# Patient Record
Sex: Female | Born: 1960 | Race: Black or African American | Hispanic: No | Smoking: Current every day smoker
Health system: Southern US, Community
[De-identification: ages and names within clinical notes are randomized; demographics above are authoritative.]

## PROBLEM LIST (undated history)

## (undated) DIAGNOSIS — I10 Essential (primary) hypertension: Secondary | ICD-10-CM

## (undated) DIAGNOSIS — J45909 Unspecified asthma, uncomplicated: Secondary | ICD-10-CM

## (undated) DIAGNOSIS — N920 Excessive and frequent menstruation with regular cycle: Secondary | ICD-10-CM

## (undated) HISTORY — PX: OTHER SURGICAL HISTORY: SHX169

## (undated) HISTORY — DX: Unspecified asthma, uncomplicated: J45.909

## (undated) HISTORY — PX: ABDOMINAL HYSTERECTOMY: SHX81

## (undated) HISTORY — DX: Excessive and frequent menstruation with regular cycle: N92.0

## (undated) HISTORY — PX: FOOT SURGERY: SHX648

## (undated) HISTORY — DX: Essential (primary) hypertension: I10

---

## 2006-02-19 ENCOUNTER — Emergency Department: Payer: Self-pay | Admitting: General Practice

## 2011-04-14 ENCOUNTER — Other Ambulatory Visit: Payer: Self-pay | Admitting: Family Medicine

## 2011-04-14 ENCOUNTER — Ambulatory Visit: Payer: Self-pay | Admitting: Family Medicine

## 2011-05-12 ENCOUNTER — Ambulatory Visit: Payer: Self-pay | Admitting: Gastroenterology

## 2011-05-16 LAB — PATHOLOGY REPORT

## 2012-02-10 ENCOUNTER — Emergency Department: Payer: Self-pay | Admitting: Emergency Medicine

## 2012-02-10 LAB — COMPREHENSIVE METABOLIC PANEL
Albumin: 4 g/dL (ref 3.4–5.0)
Alkaline Phosphatase: 55 U/L (ref 50–136)
Bilirubin,Total: 0.3 mg/dL (ref 0.2–1.0)
Chloride: 102 mmol/L (ref 98–107)
Co2: 29 mmol/L (ref 21–32)
Creatinine: 0.75 mg/dL (ref 0.60–1.30)
EGFR (Non-African Amer.): >60
Osmolality: 275 (ref 275–301)
Potassium: 3.6 mmol/L (ref 3.5–5.1)
SGPT (ALT): 20 U/L
Sodium: 138 mmol/L (ref 136–145)

## 2012-02-10 LAB — CBC
HCT: 45.7 % (ref 35.0–47.0)
HGB: 15 g/dL (ref 12.0–16.0)
MCH: 31.6 pg (ref 26.0–34.0)
MCHC: 32.9 g/dL (ref 32.0–36.0)
RBC: 4.76 10*6/uL (ref 3.80–5.20)
WBC: 4.9 10*3/uL (ref 3.6–11.0)

## 2012-02-10 LAB — CK TOTAL AND CKMB (NOT AT ARMC)
CK, Total: 147 U/L (ref 21–215)
CK-MB: 1.9 ng/mL (ref 0.5–3.6)

## 2012-02-11 ENCOUNTER — Emergency Department: Payer: Self-pay | Admitting: Internal Medicine

## 2012-05-03 ENCOUNTER — Other Ambulatory Visit: Payer: Self-pay | Admitting: Family Medicine

## 2012-05-03 ENCOUNTER — Ambulatory Visit: Payer: Self-pay | Admitting: Family Medicine

## 2012-05-03 LAB — CBC WITH DIFFERENTIAL/PLATELET
Basophil #: 0.1 10*3/uL (ref 0.0–0.1)
HCT: 42.8 % (ref 35.0–47.0)
HGB: 14.5 g/dL (ref 12.0–16.0)
Lymphocyte #: 2.1 10*3/uL (ref 1.0–3.6)
Lymphocyte %: 37.3 %
MCH: 32.1 pg (ref 26.0–34.0)
MCHC: 33.8 g/dL (ref 32.0–36.0)
MCV: 95 fL (ref 80–100)
Monocyte #: 0.3 x10 3/mm (ref 0.2–0.9)
Neutrophil #: 2.8 10*3/uL (ref 1.4–6.5)
Neutrophil %: 49.2 %
RBC: 4.51 10*6/uL (ref 3.80–5.20)
WBC: 5.7 10*3/uL (ref 3.6–11.0)

## 2012-05-03 LAB — COMPREHENSIVE METABOLIC PANEL
Albumin: 3.6 g/dL (ref 3.4–5.0)
Anion Gap: 9 (ref 7–16)
Bilirubin,Total: 0.2 mg/dL (ref 0.2–1.0)
Chloride: 105 mmol/L (ref 98–107)
Co2: 26 mmol/L (ref 21–32)
Creatinine: 0.83 mg/dL (ref 0.60–1.30)
EGFR (Non-African Amer.): >60
Potassium: 3.6 mmol/L (ref 3.5–5.1)
SGOT(AST): 28 U/L (ref 15–37)
Total Protein: 7.2 g/dL (ref 6.4–8.2)

## 2012-05-03 LAB — LIPASE, BLOOD: Lipase: 134 U/L (ref 73–393)

## 2015-08-12 ENCOUNTER — Ambulatory Visit
Admission: RE | Admit: 2015-08-12 | Discharge: 2015-08-12 | Disposition: A | Discharge status: Home / Self Care | Payer: Self-pay | Source: Ambulatory Visit | Attending: Oncology | Admitting: Oncology

## 2015-08-12 ENCOUNTER — Ambulatory Visit: Payer: Self-pay | Attending: Oncology

## 2015-08-12 VITALS — BP 120/84 | HR 96 | Temp 96.0°F | Ht 66.0 in | Wt 188.6 lb

## 2015-08-12 DIAGNOSIS — Z1231 Encounter for screening mammogram for malignant neoplasm of breast: Secondary | ICD-10-CM | POA: Insufficient documentation

## 2015-08-12 DIAGNOSIS — Z0000 Encounter for general adult medical examination without abnormal findings: Secondary | ICD-10-CM

## 2015-08-12 NOTE — Progress Notes (Signed)
Subjective:     Patient ID: Jamie Wilkins, female   DOB: 1961-10-27, 54 y.o.   MRN: [id number]  HPI   Review of Systems     Objective:   Physical Exam  Pulmonary/Chest: Right breast exhibits no inverted nipple, no mass, no nipple discharge, no skin change and no tenderness. Left breast exhibits no inverted nipple, no mass, no nipple discharge, no skin change and no tenderness. Breasts are symmetrical.       Assessment:    54 year old patient presents for [facility] clinic visit.  Patient screened, and meets BCCCP eligibility.  Patient does not have insurance, Medicare or Medicaid.  Handout given on Affordable Care Act. CBE unremarkable.  Instructed patient on breast self-exam using teach back method.     Plan:     Sent for bilateral screening mammogram.

## 2015-08-19 NOTE — Progress Notes (Signed)
Letter mailed from [facility] to notify of normal mammogram results.  Patient to return in one year for annual screening.  Copy to [facility]. 

## 2018-04-09 ENCOUNTER — Other Ambulatory Visit (HOSPITAL_COMMUNITY): Payer: Self-pay | Admitting: Nurse Practitioner

## 2018-04-09 DIAGNOSIS — Z1231 Encounter for screening mammogram for malignant neoplasm of breast: Secondary | ICD-10-CM

## 2018-05-23 ENCOUNTER — Ambulatory Visit (HOSPITAL_COMMUNITY)
Admission: RE | Admit: 2018-05-23 | Discharge: 2018-05-23 | Disposition: A | Discharge status: Home / Self Care | Payer: BLUE CROSS/BLUE SHIELD | Source: Ambulatory Visit | Attending: Nurse Practitioner | Admitting: Nurse Practitioner

## 2018-05-23 DIAGNOSIS — Z1231 Encounter for screening mammogram for malignant neoplasm of breast: Secondary | ICD-10-CM | POA: Diagnosis present

## 2020-10-06 ENCOUNTER — Ambulatory Visit (INDEPENDENT_AMBULATORY_CARE_PROVIDER_SITE_OTHER): Payer: No Typology Code available for payment source

## 2020-10-06 ENCOUNTER — Ambulatory Visit
Admission: EM | Admit: 2020-10-06 | Discharge: 2020-10-06 | Disposition: A | Discharge status: Home / Self Care | Payer: No Typology Code available for payment source

## 2020-10-06 DIAGNOSIS — S92355A Nondisplaced fracture of fifth metatarsal bone, left foot, initial encounter for closed fracture: Secondary | ICD-10-CM | POA: Diagnosis not present

## 2020-10-06 MED ORDER — IBUPROFEN 800 MG PO TABS: 800.0000 mg | ORAL_TABLET | Freq: Three times a day (TID) | ORAL | 0 refills | Status: DC | PRN

## 2020-10-06 NOTE — Discharge Instructions (Addendum)
Take the ibuprofen as prescribed.  Rest and elevate your foot.  Apply ice packs 2-3 times a day for up to 20 minutes each.  Wear the walking boot and use the crutches.  Follow-up with an orthopedist as directed.

## 2020-10-06 NOTE — ED Triage Notes (Signed)
Pt reports having L foot swelling and pain x5 days. No known injury to foot. Painful to put pressure on foot.

## 2020-10-06 NOTE — ED Provider Notes (Signed)
Renaldo Fiddler    CSN: [id number] Arrival date & time: 10/06/20  3664      History   Chief Complaint Chief Complaint  Patient presents with  . Foot Swelling    HPI KARLEE STAFF is a 59 y.o. female.   Patient presents with 5-day history of left foot pain, redness, warmth, swelling.  No known injury.  She denies fever, chills, wounds, numbness, weakness, tingling, or other symptoms.  OTC treatment attempted at home.  Patient denies pertinent medical history.  The history is provided by the patient.    History reviewed. No pertinent past medical history.  There are no problems to display for this patient.   Past Surgical History:  Procedure Laterality Date  . ABDOMINAL HYSTERECTOMY     partial  . FOOT SURGERY      OB History   No obstetric history on file.      Home Medications    Prior to Admission medications   Medication Sig Start Date End Date Taking? Authorizing Provider  buPROPion (WELLBUTRIN SR) 150 MG 12 hr tablet Take 150 mg by mouth 2 (two) times daily.   Yes [provider]  ibuprofen (ADVIL) 800 MG tablet Take 1 tablet (800 mg total) by mouth every 8 (eight) hours as needed. 10/06/20   Mickie Bail, NP    Family History No family history on file.  Social History Social History   Tobacco Use  . Smoking status: Current Every Day Smoker  . Smokeless tobacco: Never Used  Substance Use Topics  . Alcohol use: Yes  . Drug use: Never     Allergies   Patient has no known allergies.   Review of Systems Review of Systems  Constitutional: Negative for chills and fever.  HENT: Negative for ear pain and sore throat.   Eyes: Negative for pain and visual disturbance.  Respiratory: Negative for cough and shortness of breath.   Cardiovascular: Negative for chest pain and palpitations.  Gastrointestinal: Negative for abdominal pain and vomiting.  Genitourinary: Negative for dysuria and hematuria.  Musculoskeletal: Positive for  arthralgias. Negative for back pain.  Skin: Positive for color change. Negative for wound.  Neurological: Negative for seizures, syncope, weakness and numbness.  All other systems reviewed and are negative.    Physical Exam Triage Vital Signs ED Triage Vitals  Enc Vitals Group     BP 10/06/20 0846 (!) 153/83     Pulse Rate 10/06/20 0846 86     Resp 10/06/20 0846 16     Temp 10/06/20 0846 98.8 F (37.1 C)     Temp src --      SpO2 10/06/20 0846 98 %     Weight 10/06/20 0847 150 lb (68 kg)     Height 10/06/20 0847 5\' 7"  (1.702 m)     Head Circumference --      Peak Flow --      Pain Score 10/06/20 0846 10     Pain Loc --      Pain Edu? --      Excl. in GC? --    No data found.  Updated Vital Signs BP (!) 153/83   Pulse 86   Temp 98.8 F (37.1 C)   Resp 16   Ht 5\' 7"  (1.702 m)   Wt 150 lb (68 kg)   LMP 05/11/2010 (Approximate)   SpO2 98%   BMI 23.49 kg/m   Visual Acuity Right Eye Distance:   Left Eye Distance:  Bilateral Distance:    Right Eye Near:   Left Eye Near:    Bilateral Near:     Physical Exam Vitals and nursing note reviewed.  Constitutional:      General: She is not in acute distress.    Appearance: She is well-developed.  HENT:     Head: Normocephalic and atraumatic.     Mouth/Throat:     Mouth: Mucous membranes are moist.  Eyes:     Conjunctiva/sclera: Conjunctivae normal.  Cardiovascular:     Rate and Rhythm: Normal rate and regular rhythm.     Heart sounds: Normal heart sounds.  Pulmonary:     Effort: Pulmonary effort is normal. No respiratory distress.     Breath sounds: Normal breath sounds.  Abdominal:     Palpations: Abdomen is soft.     Tenderness: There is no abdominal tenderness.  Musculoskeletal:        General: Swelling and tenderness present. No deformity. Normal range of motion.     Cervical back: Neck supple.       Feet:  Skin:    General: Skin is warm and dry.     Capillary Refill: Capillary refill takes less than  2 seconds.     Findings: Erythema present.  Neurological:     Mental Status: She is alert.     Sensory: No sensory deficit.     Motor: No weakness.     Gait: Gait abnormal.  Psychiatric:        Mood and Affect: Mood normal.        Behavior: Behavior normal.      UC Treatments / Results  Labs (all labs ordered are listed, but only abnormal results are displayed) Labs Reviewed - No data to display  EKG   Radiology DG Foot Complete Left  Result Date: 10/06/2020 CLINICAL DATA:  59 year old female with a history of foot swelling and pain EXAM: LEFT FOOT - COMPLETE 3+ VIEW COMPARISON:  None. FINDINGS: Surgical changes of distal tibia and fibula, incompletely imaged. Osteopenia. Oblique fracture of the fifth metatarsal without significant displacement. Osteopenia of the forefoot. Degenerative changes of the interphalangeal joints. Soft tissue swelling on the dorsum of the forefoot IMPRESSION: Oblique fracture of the fifth metatarsal without significant displacement. Osteopenia. Surgical changes of the tibia and fibula, incompletely imaged. Electronically Signed   By: Gilmer Mor D.O.   On: 10/06/2020 09:14    Procedures Procedures (including critical care time)  Medications Ordered in UC Medications - No data to display  Initial Impression / Assessment and Plan / UC Course  I have reviewed the triage vital signs and the nursing notes.  Pertinent labs & imaging results that were available during my care of the patient were reviewed by me and considered in my medical decision making (see chart for details).   Fracture of the fifth metatarsal, left.  X-ray shows "Oblique fracture of the fifth metatarsal without significant displacement."  Treating with cam boot, crutches, ibuprofen, rest, elevation, ice packs.  Instructed patient to follow-up with orthopedics such as [facility].  Patient agrees to plan of care.     Final Clinical Impressions(s) / UC Diagnoses   Final  diagnoses:  Closed nondisplaced fracture of fifth metatarsal bone of left foot, initial encounter     Discharge Instructions     Take the ibuprofen as prescribed.  Rest and elevate your foot.  Apply ice packs 2-3 times a day for up to 20 minutes each.  Wear the walking boot and  use the crutches.  Follow-up with an orthopedist as directed.         ED Prescriptions    Medication Sig Dispense Auth. Provider   ibuprofen (ADVIL) 800 MG tablet Take 1 tablet (800 mg total) by mouth every 8 (eight) hours as needed. 21 tablet Mickie Bail, NP     PDMP not reviewed this encounter.   Mickie Bail, NP 10/06/20 [phone]

## 2020-10-22 ENCOUNTER — Other Ambulatory Visit: Payer: Self-pay | Admitting: Family Medicine

## 2020-10-22 DIAGNOSIS — M8580 Other specified disorders of bone density and structure, unspecified site: Secondary | ICD-10-CM

## 2020-10-22 DIAGNOSIS — Z1231 Encounter for screening mammogram for malignant neoplasm of breast: Secondary | ICD-10-CM

## 2020-11-23 NOTE — Progress Notes (Deleted)
   New Outpatient Visit Date: 11/25/2020  Referring Provider: Lorn Junes, FNP [address] [place],  [place] [address]  Chief Complaint: ***  HPI:  Jamie Wilkins is a 60 y.o. female who is being seen today for the evaluation of palpitations at the request of Ms. Crawford. She has a history of ***. ***  --------------------------------------------------------------------------------------------------  Cardiovascular History & Procedures: Cardiovascular Problems:  ***  Risk Factors:  ***  Cath/PCI:  ***  CV Surgery:  ***  EP Procedures and Devices:  ***  Non-Invasive Evaluation(s):  ***  Recent CV Pertinent Labs: Lab Results  Component Value Date   K 3.6 05/03/2012   BUN 14 05/03/2012   CREATININE 0.83 05/03/2012    --------------------------------------------------------------------------------------------------  No past medical history on file.  Past Surgical History:  Procedure Laterality Date  . ABDOMINAL HYSTERECTOMY     partial  . FOOT SURGERY      No outpatient medications have been marked as taking for the 11/25/20 encounter (Appointment) with Notnamed Croucher, Cristal Deer, MD.    Allergies: Patient has no known allergies.  Social History   Tobacco Use  . Smoking status: Current Every Day Smoker  . Smokeless tobacco: Never Used  Substance Use Topics  . Alcohol use: Yes  . Drug use: Never    No family history on file.  Review of Systems: A 12-system review of systems was performed and was negative except as noted in the HPI.  --------------------------------------------------------------------------------------------------  Physical Exam: LMP 05/11/2010 (Approximate)   General:  *** HEENT: No conjunctival pallor or scleral icterus. Facemask in place. Neck: Supple without lymphadenopathy, thyromegaly, JVD, or HJR. No carotid bruit. Lungs: Normal work of breathing. Clear to auscultation bilaterally without wheezes or crackles. Heart:  Regular rate and rhythm without murmurs, rubs, or gallops. Non-displaced PMI. Abd: Bowel sounds present. Soft, NT/ND without hepatosplenomegaly Ext: No lower extremity edema. Radial, PT, and DP pulses are 2+ bilaterally Skin: Warm and dry without rash. Neuro: CNIII-XII intact. Strength and fine-touch sensation intact in upper and lower extremities bilaterally. Psych: Normal mood and affect.  EKG:  ***  Lab Results  Component Value Date   WBC 5.7 05/03/2012   HGB 14.5 05/03/2012   HCT 42.8 05/03/2012   MCV 95 05/03/2012   PLT 221 05/03/2012    Lab Results  Component Value Date   NA 140 05/03/2012   K 3.6 05/03/2012   CL 105 05/03/2012   CO2 26 05/03/2012   BUN 14 05/03/2012   CREATININE 0.83 05/03/2012   GLUCOSE 104 (H) 05/03/2012   ALT 24 05/03/2012    No results found for: CHOL, HDL, LDLCALC, LDLDIRECT, TRIG, CHOLHDL   --------------------------------------------------------------------------------------------------  ASSESSMENT AND PLAN: ***  Yvonne Kendall, MD 11/23/2020 1:43 PM

## 2020-11-25 ENCOUNTER — Encounter: Payer: Self-pay | Admitting: *Deleted

## 2020-11-25 ENCOUNTER — Other Ambulatory Visit: Payer: Self-pay

## 2020-11-25 ENCOUNTER — Ambulatory Visit: Payer: No Typology Code available for payment source | Admitting: Internal Medicine

## 2020-12-04 ENCOUNTER — Ambulatory Visit: Payer: No Typology Code available for payment source | Admitting: Cardiology

## 2020-12-14 ENCOUNTER — Ambulatory Visit: Payer: No Typology Code available for payment source | Admitting: Cardiology

## 2020-12-15 ENCOUNTER — Encounter: Payer: Self-pay | Admitting: Cardiology

## 2021-02-14 ENCOUNTER — Other Ambulatory Visit: Payer: Self-pay

## 2021-02-14 ENCOUNTER — Inpatient Hospital Stay
Admission: EM | Admit: 2021-02-14 | Discharge: 2021-02-17 | DRG: 313 | Disposition: A | Discharge status: Home / Self Care | Payer: No Typology Code available for payment source | Attending: Internal Medicine | Admitting: Internal Medicine

## 2021-02-14 ENCOUNTER — Emergency Department: Payer: No Typology Code available for payment source

## 2021-02-14 DIAGNOSIS — Z716 Tobacco abuse counseling: Secondary | ICD-10-CM

## 2021-02-14 DIAGNOSIS — E440 Moderate protein-calorie malnutrition: Secondary | ICD-10-CM | POA: Insufficient documentation

## 2021-02-14 DIAGNOSIS — I951 Orthostatic hypotension: Secondary | ICD-10-CM | POA: Diagnosis present

## 2021-02-14 DIAGNOSIS — E878 Other disorders of electrolyte and fluid balance, not elsewhere classified: Secondary | ICD-10-CM

## 2021-02-14 DIAGNOSIS — R55 Syncope and collapse: Secondary | ICD-10-CM

## 2021-02-14 DIAGNOSIS — K828 Other specified diseases of gallbladder: Secondary | ICD-10-CM | POA: Diagnosis present

## 2021-02-14 DIAGNOSIS — Z20822 Contact with and (suspected) exposure to covid-19: Secondary | ICD-10-CM | POA: Diagnosis present

## 2021-02-14 DIAGNOSIS — K921 Melena: Secondary | ICD-10-CM | POA: Diagnosis present

## 2021-02-14 DIAGNOSIS — I119 Hypertensive heart disease without heart failure: Secondary | ICD-10-CM | POA: Diagnosis present

## 2021-02-14 DIAGNOSIS — R079 Chest pain, unspecified: Secondary | ICD-10-CM | POA: Diagnosis present

## 2021-02-14 DIAGNOSIS — Z8249 Family history of ischemic heart disease and other diseases of the circulatory system: Secondary | ICD-10-CM

## 2021-02-14 DIAGNOSIS — K760 Fatty (change of) liver, not elsewhere classified: Secondary | ICD-10-CM | POA: Diagnosis present

## 2021-02-14 DIAGNOSIS — Z6822 Body mass index (BMI) 22.0-22.9, adult: Secondary | ICD-10-CM

## 2021-02-14 DIAGNOSIS — Z7289 Other problems related to lifestyle: Secondary | ICD-10-CM

## 2021-02-14 DIAGNOSIS — Z79899 Other long term (current) drug therapy: Secondary | ICD-10-CM

## 2021-02-14 DIAGNOSIS — R0789 Other chest pain: Principal | ICD-10-CM | POA: Diagnosis present

## 2021-02-14 DIAGNOSIS — R7989 Other specified abnormal findings of blood chemistry: Secondary | ICD-10-CM

## 2021-02-14 DIAGNOSIS — E869 Volume depletion, unspecified: Secondary | ICD-10-CM | POA: Diagnosis present

## 2021-02-14 DIAGNOSIS — R748 Abnormal levels of other serum enzymes: Secondary | ICD-10-CM

## 2021-02-14 DIAGNOSIS — R7401 Elevation of levels of liver transaminase levels: Secondary | ICD-10-CM

## 2021-02-14 DIAGNOSIS — D520 Dietary folate deficiency anemia: Secondary | ICD-10-CM | POA: Diagnosis present

## 2021-02-14 DIAGNOSIS — Z7141 Alcohol abuse counseling and surveillance of alcoholic: Secondary | ICD-10-CM

## 2021-02-14 DIAGNOSIS — F17210 Nicotine dependence, cigarettes, uncomplicated: Secondary | ICD-10-CM | POA: Diagnosis present

## 2021-02-14 DIAGNOSIS — R918 Other nonspecific abnormal finding of lung field: Secondary | ICD-10-CM | POA: Diagnosis present

## 2021-02-14 DIAGNOSIS — N179 Acute kidney failure, unspecified: Secondary | ICD-10-CM | POA: Diagnosis present

## 2021-02-14 DIAGNOSIS — Z7951 Long term (current) use of inhaled steroids: Secondary | ICD-10-CM

## 2021-02-14 DIAGNOSIS — Z90710 Acquired absence of both cervix and uterus: Secondary | ICD-10-CM

## 2021-02-14 DIAGNOSIS — I480 Paroxysmal atrial fibrillation: Secondary | ICD-10-CM | POA: Diagnosis present

## 2021-02-14 DIAGNOSIS — E876 Hypokalemia: Secondary | ICD-10-CM

## 2021-02-14 DIAGNOSIS — K7010 Alcoholic hepatitis without ascites: Secondary | ICD-10-CM

## 2021-02-14 LAB — BASIC METABOLIC PANEL
Anion gap: 15 (ref 5–15)
BUN: 19 mg/dL (ref 6–20)
CO2: 39 mmol/L — ABNORMAL HIGH (ref 22–32)
Calcium: 6.4 mg/dL — CL (ref 8.9–10.3)
Chloride: 83 mmol/L — ABNORMAL LOW (ref 98–111)
Creatinine, Ser: 1.35 mg/dL — ABNORMAL HIGH (ref 0.44–1.00)
GFR, Estimated: 45 mL/min — ABNORMAL LOW (ref >60)
Glucose, Bld: 126 mg/dL — ABNORMAL HIGH (ref 70–99)
Potassium: 2.4 mmol/L — CL (ref 3.5–5.1)
Sodium: 137 mmol/L (ref 135–145)

## 2021-02-14 LAB — CBC
HCT: 31.7 % — ABNORMAL LOW (ref 36.0–46.0)
Hemoglobin: 10.8 g/dL — ABNORMAL LOW (ref 12.0–15.0)
MCH: 35.6 pg — ABNORMAL HIGH (ref 26.0–34.0)
MCHC: 34.1 g/dL (ref 30.0–36.0)
MCV: 104.6 fL — ABNORMAL HIGH (ref 80.0–100.0)
Platelets: 236 10*3/uL (ref 150–400)
RBC: 3.03 MIL/uL — ABNORMAL LOW (ref 3.87–5.11)
RDW: 15.2 % (ref 11.5–15.5)
WBC: 4.1 10*3/uL (ref 4.0–10.5)
nRBC: 0.5 % — ABNORMAL HIGH (ref 0.0–0.2)

## 2021-02-14 LAB — HEPATIC FUNCTION PANEL
ALT: 50 U/L — ABNORMAL HIGH (ref 0–44)
AST: 269 U/L — ABNORMAL HIGH (ref 15–41)
Albumin: 3.3 g/dL — ABNORMAL LOW (ref 3.5–5.0)
Alkaline Phosphatase: 148 U/L — ABNORMAL HIGH (ref 38–126)
Bilirubin, Direct: 0.4 mg/dL — ABNORMAL HIGH (ref 0.0–0.2)
Indirect Bilirubin: 0.8 mg/dL (ref 0.3–0.9)
Total Bilirubin: 1.2 mg/dL (ref 0.3–1.2)
Total Protein: 6.4 g/dL — ABNORMAL LOW (ref 6.5–8.1)

## 2021-02-14 LAB — TROPONIN I (HIGH SENSITIVITY): Troponin I (High Sensitivity): 17 ng/L (ref <18)

## 2021-02-14 LAB — LIPASE, BLOOD: Lipase: 333 U/L — ABNORMAL HIGH (ref 11–51)

## 2021-02-14 MED ORDER — MAGNESIUM SULFATE 2 GM/50ML IV SOLN
2.0000 g | Freq: Once | INTRAVENOUS | Status: AC
  Administered 2021-02-15: 2 g via INTRAVENOUS
  Filled 2021-02-14: qty 50

## 2021-02-14 MED ORDER — POTASSIUM CHLORIDE CRYS ER 20 MEQ PO TBCR
40.0000 meq | EXTENDED_RELEASE_TABLET | Freq: Once | ORAL | Status: AC
  Administered 2021-02-15: 40 meq via ORAL
  Filled 2021-02-14: qty 2

## 2021-02-14 MED ORDER — CALCIUM GLUCONATE-NACL 1-0.675 GM/50ML-% IV SOLN
1.0000 g | Freq: Once | INTRAVENOUS | Status: AC
  Administered 2021-02-15: 1000 mg via INTRAVENOUS
  Filled 2021-02-14: qty 50

## 2021-02-14 MED ORDER — SODIUM CHLORIDE 0.9 % IV BOLUS
500.0000 mL | Freq: Once | INTRAVENOUS | Status: AC
  Administered 2021-02-15: 500 mL via INTRAVENOUS

## 2021-02-14 NOTE — ED Provider Notes (Signed)
[facility] Emergency Department Provider Note  ____________________________________________   Event Date/Time   First MD Initiated Contact with Patient 02/14/21 2312     (approximate)  I have reviewed the triage vital signs and the nursing notes.   HISTORY  Chief Complaint Chest Pain    HPI Jamie Wilkins is a 60 y.o. female with medical history as listed below who presents for evaluation of chest pain and a syncopal episode.  She reports that the chest pain started about 24 hours ago.  She was also feeling somewhat dizzy and went home from work (she works at nights).  She describes the pain as starting gradually in the middle part of her chest and gradually getting worse over the course of the next 24 hours until it has become at least moderate tonight.  She went ahead and went to work but then she was at work and standing around not doing anything in particular and then passed out.  She does not remember anything happening, just remembers waking up on the floor.  The onset was acute and severe.  She does not think she hit her head and she has no pain in her head, neck, nor arms.  She still has the chest pain which is worse if she moves around or takes a deep breath.  There may be some pain in her upper abdomen as well.  She had one episode of nausea and vomiting yesterday morning but none since that time.  She has been eating and drinking okay, she thinks, but sometimes she admits that she does not eat as much as she should.  She drinks alcohol almost every day but said that she has not had any since yesterday morning.  She denies having any history of withdrawal symptoms.  She denies drug use.  She reports being a "some day" cigarette smoker.  No personal history of heart problems but extensive first-degree family history of heart attacks.  She does not believe that she has diabetes and is unaware if she has high cholesterol.  No history of blood clots in the legs  of the lungs.  No history of atrial fibrillation.        Past Medical History:  Diagnosis Date  . Asthma   . Heavy menstrual period   . Hypertension     There are no problems to display for this patient.   Past Surgical History:  Procedure Laterality Date  . ABDOMINAL HYSTERECTOMY     partial  . FOOT SURGERY    . S/P BTL      Prior to Admission medications   Medication Sig Start Date End Date Taking? Authorizing Provider  albuterol (PROVENTIL) (2.5 MG/3ML) 0.083% nebulizer solution Take 2.5 mg by nebulization every 6 (six) hours as needed for wheezing or shortness of breath.   Yes [provider]  amLODipine (NORVASC) 10 MG tablet Take 10 mg by mouth daily.   Yes [provider]  atorvastatin (LIPITOR) 40 MG tablet Take 40 mg by mouth daily.   Yes [provider]  buPROPion (WELLBUTRIN SR) 150 MG 12 hr tablet Take 150 mg by mouth 2 (two) times daily.   Yes [provider]  buPROPion (WELLBUTRIN XL) 150 MG 24 hr tablet Take 300 mg by mouth daily. 01/14/21  Yes [provider]  CALCIUM 600+D 600-400 MG-UNIT tablet Take 1 tablet by mouth 2 (two) times daily. 10/20/20  Yes [provider]  FLOVENT HFA 110 MCG/ACT inhaler Inhale 2  puffs into the lungs 2 (two) times daily. 01/14/21  Yes [provider]  fluticasone (FLONASE) 50 MCG/ACT nasal spray Place 2 sprays into both nostrils daily.   Yes [provider]  hydrochlorothiazide (HYDRODIURIL) 25 MG tablet Take 25 mg by mouth daily. for high blood pressure 09/18/20  Yes [provider]  ibuprofen (ADVIL) 800 MG tablet Take 1 tablet (800 mg total) by mouth every 8 (eight) hours as needed. 10/06/20  Yes Mickie Bail, NP  KLOR-CON M20 20 MEQ tablet Take 2 tablets by mouth 3 (three) times daily. 01/14/21  Yes [provider]  losartan (COZAAR) 25 MG tablet Take 25 mg by mouth daily.   Yes [provider]  montelukast (SINGULAIR) 10 MG tablet  Take 10 mg by mouth at bedtime. 08/07/20  Yes [provider]  omeprazole (PRILOSEC) 40 MG capsule Take 40 mg by mouth daily.   Yes [provider]  PROAIR HFA 108 [phone] Base) MCG/ACT inhaler 2 puff  every four to six hours as needed for wheezing, shortness of breath 10/20/20  Yes [provider]  mupirocin ointment (BACTROBAN) 2 % 1 application 2 (two) times daily. Patient not taking: No sig reported    [provider]  triamcinolone (KENALOG) 0.1 % Apply 1 application topically 2 (two) times daily. Patient not taking: No sig reported    [provider]    Allergies Patient has no known allergies.  History reviewed. No pertinent family history.  Social History Social History   Tobacco Use  . Smoking status: Current Every Day Smoker    Packs/day: 0.50    Types: Cigarettes  . Smokeless tobacco: Never Used  Substance Use Topics  . Alcohol use: Yes    Alcohol/week: 7.0 standard drinks    Types: 7 Standard drinks or equivalent per week  . Drug use: Never    Review of Systems Constitutional: No fever/chills Eyes: No visual changes. ENT: No sore throat. Cardiovascular: Positive for chest pain and syncope. Respiratory: Denies shortness of breath. Gastrointestinal: Questionable upper abdominal pain.  1 episode of vomiting. Genitourinary: Negative for dysuria. Musculoskeletal: Negative for neck pain.  Negative for back pain. Integumentary: Negative for rash. Neurological: Negative for headaches, focal weakness or numbness.   ____________________________________________   PHYSICAL EXAM:  VITAL SIGNS: ED Triage Vitals  Enc Vitals Group     BP 02/14/21 2235 124/83     Pulse Rate 02/14/21 2235 85     Resp 02/14/21 2235 16     Temp 02/14/21 2235 98.5 F (36.9 C)     Temp src --      SpO2 02/14/21 2235 95 %     Weight 02/14/21 2244 68 kg (150 lb)     Height 02/14/21 2244 1.702 m (5\' 7" )     Head Circumference --      Peak Flow --       Pain Score 02/14/21 2245 4     Pain Loc --      Pain Edu? --      Excl. in GC? --     Constitutional: Alert and oriented.  Eyes: Conjunctivae are normal.  Head: Atraumatic. Nose: No congestion/rhinnorhea. Mouth/Throat: Patient is wearing a mask. Neck: No stridor.  No meningeal signs.   Cardiovascular: Normal rate, irregular rhythm. Good peripheral circulation. Respiratory: Normal respiratory effort.  No retractions. Gastrointestinal: Soft and nondistended.  Mild tenderness to palpation of the epigastrium, mild tenderness in the right upper quadrant but negative Murphy sign.  No  lower abdominal tenderness. Musculoskeletal: No lower extremity tenderness nor edema. No gross deformities of extremities.  Sternal pain is somewhat reproducible. Neurologic:  Normal speech and language. No gross focal neurologic deficits are appreciated.  Skin:  Skin is warm, dry and intact. Psychiatric: Mood and affect are normal. Speech and behavior are normal.  ____________________________________________   LABS (all labs ordered are listed, but only abnormal results are displayed)  Labs Reviewed  BASIC METABOLIC PANEL - Abnormal; Notable for the following components:      Result Value   Potassium 2.4 (*)    Chloride 83 (*)    CO2 39 (*)    Glucose, Bld 126 (*)    Creatinine, Ser 1.35 (*)    Calcium 6.4 (*)    GFR, Estimated 45 (*)    All other components within normal limits  CBC - Abnormal; Notable for the following components:   RBC 3.03 (*)    Hemoglobin 10.8 (*)    HCT 31.7 (*)    MCV 104.6 (*)    MCH 35.6 (*)    nRBC 0.5 (*)    All other components within normal limits  HEPATIC FUNCTION PANEL - Abnormal; Notable for the following components:   Total Protein 6.4 (*)    Albumin 3.3 (*)    AST 269 (*)    ALT 50 (*)    Alkaline Phosphatase 148 (*)    Bilirubin, Direct 0.4 (*)    All other components within normal limits  LIPASE, BLOOD - Abnormal; Notable for the following  components:   Lipase 333 (*)    All other components within normal limits  MAGNESIUM - Abnormal; Notable for the following components:   Magnesium 0.9 (*)    All other components within normal limits  SARS CORONAVIRUS 2 (TAT 6-24 HRS)  ETHANOL  TROPONIN I (HIGH SENSITIVITY)  TROPONIN I (HIGH SENSITIVITY)   ____________________________________________  EKG  ED ECG REPORT I, Loleta Rose, the attending physician, personally viewed and interpreted this ECG.  Date: 02/14/2021 EKG Time: 22: 39 Rate: 84 Rhythm: Atrial fibrillation  QRS Axis: Normal Intervals: Abnormal secondary to atrial fibrillation as well as having a prolonged QTC of 493 ms.  Questionable U waves, difficult to appreciate. ST/T Wave abnormalities: Non-specific ST segment / T-wave changes, but no clear evidence of acute ischemia. Narrative Interpretation: no definitive evidence of acute ischemia; does not meet STEMI criteria.   ____________________________________________  RADIOLOGY I, Loleta Rose, personally viewed and evaluated these images (plain radiographs) as part of my medical decision making, as well as reviewing the written report by the radiologist.  ED MD interpretation: No acute abnormality identified on chest x-ray.  CTA chest and CT abdomen/pelvis demonstrate no acute or emergent abnormalities.  Official radiology report(s): DG Chest 2 View  Result Date: 02/14/2021 CLINICAL DATA:  Chest pain.  Syncopal episode. EXAM: CHEST - 2 VIEW COMPARISON:  Radiograph 02/10/2012 FINDINGS: The cardiomediastinal contours are normal. The lungs are clear. Pulmonary vasculature is normal. No consolidation, pleural effusion, or pneumothorax. No acute osseous abnormalities are seen. IMPRESSION: No acute chest findings. Electronically Signed   By: Narda Rutherford M.D.   On: 02/14/2021 23:31   CT Angio Chest PE W/Cm &/Or Wo Cm  Result Date: 02/15/2021 CLINICAL DATA:  Chest and abdominal pain EXAM: CT ANGIOGRAPHY CHEST  CT ABDOMEN AND PELVIS WITH CONTRAST TECHNIQUE: Multidetector CT imaging of the chest was performed using the standard protocol during bolus administration of intravenous contrast. Multiplanar CT image reconstructions and MIPs were obtained  to evaluate the vascular anatomy. Multidetector CT imaging of the abdomen and pelvis was performed using the standard protocol during bolus administration of intravenous contrast. CONTRAST:  46mL OMNIPAQUE IOHEXOL 350 MG/ML SOLN COMPARISON:  CT abdomen pelvis 02/19/2006 FINDINGS: CTA CHEST FINDINGS Cardiovascular: Contrast injection is sufficient to demonstrate satisfactory opacification of the pulmonary arteries to the segmental level. There is no pulmonary embolus. The main pulmonary artery is within normal limits for size. There is no CT evidence of acute right heart strain. There is calcific aortic atherosclerosis. There is a normal 3-vessel arch branching pattern. Heart size is normal, without pericardial effusion. Mediastinum/Nodes: No mediastinal, hilar or axillary lymphadenopathy. The visualized thyroid and thoracic esophageal course are unremarkable. Lungs/Pleura: 6 mm right middle lobe pulmonary nodule (series 6, image 36). 4 mm right lower lobe pulmonary nodule (series 6, image 18). No pleural effusion or pneumothorax. No focal airspace consolidation. No focal pleural abnormality. Musculoskeletal: No chest wall abnormality. No acute or significant osseous findings. Review of the MIP images confirms the above findings. CT ABDOMEN and PELVIS FINDINGS Hepatobiliary: Hepatic steatosis.  Gallbladder is unremarkable. Pancreas: Normal contours without ductal dilatation. No peripancreatic fluid collection. Spleen: Normal. Adrenals/Urinary Tract: --Adrenal glands: Normal. --Right kidney/ureter: No hydronephrosis or perinephric stranding. No nephrolithiasis. No obstructing ureteral stones. --Left kidney/ureter: No hydronephrosis or perinephric stranding. No nephrolithiasis. No  obstructing ureteral stones. --Urinary bladder: Unremarkable. Stomach/Bowel: --Stomach/Duodenum: No hiatal hernia or other gastric abnormality. Normal duodenal course and caliber. --Small bowel: No dilatation or inflammation. --Colon: No focal abnormality. --Appendix: Normal. Vascular/Lymphatic: Atherosclerotic calcification is present within the non-aneurysmal abdominal aorta, without hemodynamically significant stenosis. No abdominal or pelvic lymphadenopathy. Reproductive: Status post hysterectomy. No adnexal mass. Musculoskeletal. No bony spinal canal stenosis or focal osseous abnormality. Other: None. IMPRESSION: 1. No pulmonary embolus or acute aortic syndrome. 2. No acute abnormality of the chest, abdomen or pelvis. 3. Hepatic steatosis. 4. Pulmonary nodules measuring up to 6 mm, unchanged compared to 02/19/2006 and therefore benign. Aortic Atherosclerosis (ICD10-I70.0). Electronically Signed   By: Deatra Robinson M.D.   On: 02/15/2021 00:48   CT ABDOMEN PELVIS W CONTRAST  Result Date: 02/15/2021 CLINICAL DATA:  Chest and abdominal pain EXAM: CT ANGIOGRAPHY CHEST CT ABDOMEN AND PELVIS WITH CONTRAST TECHNIQUE: Multidetector CT imaging of the chest was performed using the standard protocol during bolus administration of intravenous contrast. Multiplanar CT image reconstructions and MIPs were obtained to evaluate the vascular anatomy. Multidetector CT imaging of the abdomen and pelvis was performed using the standard protocol during bolus administration of intravenous contrast. CONTRAST:  40mL OMNIPAQUE IOHEXOL 350 MG/ML SOLN COMPARISON:  CT abdomen pelvis 02/19/2006 FINDINGS: CTA CHEST FINDINGS Cardiovascular: Contrast injection is sufficient to demonstrate satisfactory opacification of the pulmonary arteries to the segmental level. There is no pulmonary embolus. The main pulmonary artery is within normal limits for size. There is no CT evidence of acute right heart strain. There is calcific aortic  atherosclerosis. There is a normal 3-vessel arch branching pattern. Heart size is normal, without pericardial effusion. Mediastinum/Nodes: No mediastinal, hilar or axillary lymphadenopathy. The visualized thyroid and thoracic esophageal course are unremarkable. Lungs/Pleura: 6 mm right middle lobe pulmonary nodule (series 6, image 36). 4 mm right lower lobe pulmonary nodule (series 6, image 18). No pleural effusion or pneumothorax. No focal airspace consolidation. No focal pleural abnormality. Musculoskeletal: No chest wall abnormality. No acute or significant osseous findings. Review of the MIP images confirms the above findings. CT ABDOMEN and PELVIS FINDINGS Hepatobiliary: Hepatic steatosis.  Gallbladder is unremarkable. Pancreas:  Normal contours without ductal dilatation. No peripancreatic fluid collection. Spleen: Normal. Adrenals/Urinary Tract: --Adrenal glands: Normal. --Right kidney/ureter: No hydronephrosis or perinephric stranding. No nephrolithiasis. No obstructing ureteral stones. --Left kidney/ureter: No hydronephrosis or perinephric stranding. No nephrolithiasis. No obstructing ureteral stones. --Urinary bladder: Unremarkable. Stomach/Bowel: --Stomach/Duodenum: No hiatal hernia or other gastric abnormality. Normal duodenal course and caliber. --Small bowel: No dilatation or inflammation. --Colon: No focal abnormality. --Appendix: Normal. Vascular/Lymphatic: Atherosclerotic calcification is present within the non-aneurysmal abdominal aorta, without hemodynamically significant stenosis. No abdominal or pelvic lymphadenopathy. Reproductive: Status post hysterectomy. No adnexal mass. Musculoskeletal. No bony spinal canal stenosis or focal osseous abnormality. Other: None. IMPRESSION: 1. No pulmonary embolus or acute aortic syndrome. 2. No acute abnormality of the chest, abdomen or pelvis. 3. Hepatic steatosis. 4. Pulmonary nodules measuring up to 6 mm, unchanged compared to 02/19/2006 and therefore benign.  Aortic Atherosclerosis (ICD10-I70.0). Electronically Signed   By: Deatra Robinson M.D.   On: 02/15/2021 00:48    ____________________________________________   PROCEDURES   Procedure(s) performed (including Critical Care):  .1-3 Lead EKG Interpretation Performed by: Loleta Rose, MD Authorized by: Loleta Rose, MD     Interpretation: abnormal     ECG rate:  82   ECG rate assessment: normal     Rhythm: atrial fibrillation     Ectopy: none     Conduction: normal       ____________________________________________   INITIAL IMPRESSION / MDM / ASSESSMENT AND PLAN / ED COURSE  As part of my medical decision making, I reviewed the following data within the electronic MEDICAL RECORD NUMBER Nursing notes reviewed and incorporated, Labs reviewed , EKG interpreted , Old EKG reviewed, Old chart reviewed, Radiograph reviewed , Discussed with admitting physician  and Notes from prior ED visits   Differential diagnosis includes, but is not limited to, pulmonary embolism, ACS, pneumonia, musculoskeletal strain or inflammation, electrolyte or metabolic abnormality, intoxication or alcohol withdrawal.  The patient is on the cardiac monitor to evaluate for evidence of arrhythmia and/or significant heart rate changes.  The patient has stable vital signs but concerning history of present illness.  Syncope without prodrome in the setting of chest pain makes me concerned for pulmonary embolism.  However she admits to being a regular drinker and has some tenderness to palpation of her upper abdomen.  This could be referred pain from her pancreas.  I personally reviewed the patient's imaging and agree with the radiologist's interpretation that there is no evidence of pneumonia or pneumothorax on chest x-ray.  EKG is notable for atrial fibrillation which is new for her.  She also has a prolonged QTC, both absolutely and relatively compared to prior EKG although the prior EKG was from 2013.  Labs have come  back and are notable for an essentially normal CBC.  Metabolic panel demonstrates profound hypokalemia at 2.4 and a calcium of 6.4 as well as a mild acute kidney injury with a GFR of 45.  The hypokalemia and hypocalcemia could explain EKG changes of prolonged QTC and even a brief arrhythmia leading to syncope.  Magnesium level is in process, but I have initiated treatment with calcium gluconate 1 g IV, potassium 40 mEq p.o., and magnesium 2 g IV.  I am also giving a 500 mL normal saline IV bolus because I will proceed with CTA chest to rule out pulmonary embolism.  Additionally her hepatic function panel is elevated almost globally though she has a normal total bilirubin.  Her lipase is 333.  I will also obtain  a CT abdomen/pelvis with IV contrast to evaluate for the possibility of radiographically demonstrable pancreatitis.  She has a negative Murphy sign and I do not believe this is biliary in nature.  I added on an ethanol level given her history.  High-sensitivity troponin is 17 which is reassuring at the moment but will certainly need to be trended.  I talked with the patient about the abnormal results and she understands she will need to stay in the hospital.  I will reassess after her imaging and then plan for hospitalization.     Clinical Course as of 02/15/21 0116  Mon Feb 15, 2021  0050 Alcohol, Ethyl (B): <10 [CF]  0050 Magnesium(!!): 0.9 Profoundly depleted magnesium.  I already ordered magnesium 2 g IV [CF]  0059 CT Angio Chest PE W/Cm &/Or Wo Cm CTA chest notable only for pulmonary nodule which has been present in the past.  CT abdomen pelvis did not demonstrate any acute abnormalities including no radiographic evidence of pancreatitis.  Consulting hospitalist for admission for the syncopal episode, chest pain, and electrolyte abnormalities. [CF]  0115 Discussed case by phone with Dr. Arville CareMansy who will admit. [CF]    Clinical Course User Index [CF] Loleta RoseForbach, , MD      ____________________________________________  FINAL CLINICAL IMPRESSION(S) / ED DIAGNOSES  Final diagnoses:  Chest pain, unspecified type  Transaminitis  Elevated lipase  Syncope, unspecified syncope type  Hypomagnesemia  Hypokalemia  Hypocalcemia  Pulmonary nodules     MEDICATIONS GIVEN DURING THIS VISIT:  Medications  calcium gluconate 1 g/ 50 mL sodium chloride IVPB (1,000 mg Intravenous New Bag/Given 02/15/21 0032)  magnesium sulfate IVPB 2 g 50 mL (2 g Intravenous New Bag/Given 02/15/21 0033)  potassium chloride SA (KLOR-CON) CR tablet 40 mEq (40 mEq Oral Given 02/15/21 0029)  sodium chloride 0.9 % bolus 500 mL (500 mLs Intravenous New Bag/Given 02/15/21 0030)  iohexol (OMNIPAQUE) 350 MG/ML injection 75 mL (75 mLs Intravenous Contrast Given 02/15/21 0013)     ED Discharge Orders    None      *Please note:  Jamie Wilkins was evaluated in Emergency Department on 02/15/2021 for the symptoms described in the history of present illness. She was evaluated in the context of the global COVID-19 pandemic, which necessitated consideration that the patient might be at risk for infection with the SARS-CoV-2 virus that causes COVID-19. Institutional protocols and algorithms that pertain to the evaluation of patients at risk for COVID-19 are in a state of rapid change based on information released by regulatory bodies including the CDC and federal and state organizations. These policies and algorithms were followed during the patient's care in the ED.  Some ED evaluations and interventions may be delayed as a result of limited staffing during and after the pandemic.*  Note:  This document was prepared using Dragon voice recognition software and may include unintentional dictation errors.   Loleta RoseForbach, , MD 02/15/21 [phone]

## 2021-02-14 NOTE — ED Provider Notes (Incomplete)
[facility] Emergency Department Provider Note  ____________________________________________   Event Date/Time   First MD Initiated Contact with Patient 02/14/21 2312     (approximate)  I have reviewed the triage vital signs and the nursing notes.   HISTORY  Chief Complaint Chest Pain    HPI Jamie Wilkins is a 60 y.o. female with medical history as listed below who presents for evaluation of chest pain and a syncopal episode.  She reports that the chest pain started about 24 hours ago.  She was also feeling somewhat dizzy and went home from work (she works at nights).  She describes the pain as starting gradually in the middle part of her chest and gradually getting worse over the course of the next 24 hours until it has become at least moderate tonight.  She went ahead and went to work but then she was at work and standing around not doing anything in particular and then passed out.  She does not remember anything happening, just remembers waking up on the floor.  The onset was acute and severe.  She does not think she hit her head and she has no pain in her head, neck, nor arms.  She still has the chest pain which is worse if she moves around or takes a deep breath.  There may be some pain in her upper abdomen as well.  She had one episode of nausea and vomiting yesterday morning but none since that time.  She has been eating and drinking okay, she thinks, but sometimes she admits that she does not eat as much as she should.  She drinks alcohol almost every day but said that she has not had any since yesterday morning.  She denies having any history of withdrawal symptoms.  She denies drug use.  She reports being a "some day" cigarette smoker.  No personal history of heart problems but extensive first-degree family history of heart attacks.  She does not believe that she has diabetes and is unaware if she has high cholesterol.  No history of blood clots in the legs  of the lungs.  No history of atrial fibrillation.        Past Medical History:  Diagnosis Date  . Asthma   . Heavy menstrual period   . Hypertension     There are no problems to display for this patient.   Past Surgical History:  Procedure Laterality Date  . ABDOMINAL HYSTERECTOMY     partial  . FOOT SURGERY    . S/P BTL      Prior to Admission medications   Medication Sig Start Date End Date Taking? Authorizing Provider  albuterol (PROVENTIL) (2.5 MG/3ML) 0.083% nebulizer solution Take 2.5 mg by nebulization every 6 (six) hours as needed for wheezing or shortness of breath.    [provider]  amLODipine (NORVASC) 10 MG tablet Take 10 mg by mouth daily.    [provider]  atorvastatin (LIPITOR) 40 MG tablet Take 40 mg by mouth daily.    [provider]  buPROPion (WELLBUTRIN SR) 150 MG 12 hr tablet Take 150 mg by mouth 2 (two) times daily.    [provider]  fluticasone (FLONASE) 50 MCG/ACT nasal spray Place 2 sprays into both nostrils daily.    [provider]  hydrochlorothiazide (HYDRODIURIL) 25 MG tablet Take 25 mg by mouth daily. for high blood pressure 09/18/20   [provider]  ibuprofen (ADVIL) 800 MG tablet Take 1  tablet (800 mg total) by mouth every 8 (eight) hours as needed. 10/06/20   Mickie Bail, NP  losartan (COZAAR) 25 MG tablet Take 25 mg by mouth daily.    [provider]  montelukast (SINGULAIR) 10 MG tablet Take 10 mg by mouth at bedtime. 08/07/20   [provider]  mupirocin ointment (BACTROBAN) 2 % 1 application 2 (two) times daily.    [provider]  omeprazole (PRILOSEC) 40 MG capsule Take 40 mg by mouth daily.    [provider]  PROAIR HFA 108 [phone] Base) MCG/ACT inhaler 2 puff  every four to six hours as needed for wheezing, shortness of breath 10/20/20   [provider]  triamcinolone (KENALOG) 0.1 % Apply 1 application topically 2 (two) times daily.     [provider]    Allergies Patient has no known allergies.  History reviewed. No pertinent family history.  Social History Social History   Tobacco Use  . Smoking status: Current Every Day Smoker    Packs/day: 0.50    Types: Cigarettes  . Smokeless tobacco: Never Used  Substance Use Topics  . Alcohol use: Yes    Alcohol/week: 7.0 standard drinks    Types: 7 Standard drinks or equivalent per week  . Drug use: Never    Review of Systems Constitutional: No fever/chills Eyes: No visual changes. ENT: No sore throat. Cardiovascular: Positive for chest pain and syncope. Respiratory: Denies shortness of breath. Gastrointestinal: Questionable upper abdominal pain.  1 episode of vomiting. Genitourinary: Negative for dysuria. Musculoskeletal: Negative for neck pain.  Negative for back pain. Integumentary: Negative for rash. Neurological: Negative for headaches, focal weakness or numbness.   ____________________________________________   PHYSICAL EXAM:  VITAL SIGNS: ED Triage Vitals  Enc Vitals Group     BP 02/14/21 2235 124/83     Pulse Rate 02/14/21 2235 85     Resp 02/14/21 2235 16     Temp 02/14/21 2235 98.5 F (36.9 C)     Temp src --      SpO2 02/14/21 2235 95 %     Weight 02/14/21 2244 68 kg (150 lb)     Height 02/14/21 2244 1.702 m (5\' 7" )     Head Circumference --      Peak Flow --      Pain Score 02/14/21 2245 4     Pain Loc --      Pain Edu? --      Excl. in GC? --     Constitutional: Alert and oriented.  Eyes: Conjunctivae are normal.  Head: Atraumatic. Nose: No congestion/rhinnorhea. Mouth/Throat: Patient is wearing a mask. Neck: No stridor.  No meningeal signs.   Cardiovascular: Normal rate, irregular rhythm. Good peripheral circulation. Respiratory: Normal respiratory effort.  No retractions. Gastrointestinal: Soft and nondistended.  Mild tenderness to palpation of the epigastrium, mild tenderness in the right upper quadrant but  negative Murphy sign.  No lower abdominal tenderness. Musculoskeletal: No lower extremity tenderness nor edema. No gross deformities of extremities.  Sternal pain is somewhat reproducible. Neurologic:  Normal speech and language. No gross focal neurologic deficits are appreciated.  Skin:  Skin is warm, dry and intact. Psychiatric: Mood and affect are normal. Speech and behavior are normal.  ____________________________________________   LABS (all labs ordered are listed, but only abnormal results are displayed)  Labs Reviewed  BASIC METABOLIC PANEL - Abnormal; Notable for the following components:      Result Value   Potassium 2.4 (*)  Chloride 83 (*)    CO2 39 (*)    Glucose, Bld 126 (*)    Creatinine, Ser 1.35 (*)    Calcium 6.4 (*)    GFR, Estimated 45 (*)    All other components within normal limits  CBC - Abnormal; Notable for the following components:   RBC 3.03 (*)    Hemoglobin 10.8 (*)    HCT 31.7 (*)    MCV 104.6 (*)    MCH 35.6 (*)    nRBC 0.5 (*)    All other components within normal limits  HEPATIC FUNCTION PANEL  LIPASE, BLOOD  ETHANOL  MAGNESIUM  TROPONIN I (HIGH SENSITIVITY)   ____________________________________________  EKG  ED ECG REPORT I, Loleta Rose, the attending physician, personally viewed and interpreted this ECG.  Date: 02/14/2021 EKG Time: 22: 39 Rate: 84 Rhythm: Atrial fibrillation  QRS Axis: Normal Intervals: Abnormal secondary to atrial fibrillation as well as having a prolonged QTC of 493 ms.  Questionable U waves, difficult to appreciate. ST/T Wave abnormalities: Non-specific ST segment / T-wave changes, but no clear evidence of acute ischemia. Narrative Interpretation: no definitive evidence of acute ischemia; does not meet STEMI criteria.   ____________________________________________  RADIOLOGY I, Loleta Rose, personally viewed and evaluated these images (plain radiographs) as part of my medical decision making, as well  as reviewing the written report by the radiologist.  ED MD interpretation: No acute abnormality identified on chest x-ray.  CTA chest and CT abdomen/pelvis demonstrate ***  Official radiology report(s): DG Chest 2 View  Result Date: 02/14/2021 CLINICAL DATA:  Chest pain.  Syncopal episode. EXAM: CHEST - 2 VIEW COMPARISON:  Radiograph 02/10/2012 FINDINGS: The cardiomediastinal contours are normal. The lungs are clear. Pulmonary vasculature is normal. No consolidation, pleural effusion, or pneumothorax. No acute osseous abnormalities are seen. IMPRESSION: No acute chest findings. Electronically Signed   By: Narda Rutherford M.D.   On: 02/14/2021 23:31    ____________________________________________   PROCEDURES   Procedure(s) performed (including Critical Care):  Procedures   ____________________________________________   INITIAL IMPRESSION / MDM / ASSESSMENT AND PLAN / ED COURSE  As part of my medical decision making, I reviewed the following data within the electronic MEDICAL RECORD NUMBER {[id number]::"Notes from prior ED visits","[place] Controlled Substance Database"}   Differential diagnosis includes, but is not limited to, ***  {**The patient is on the cardiac monitor to evaluate for evidence of arrhythmia and/or significant heart rate changes.**}         ____________________________________________  FINAL CLINICAL IMPRESSION(S) / ED DIAGNOSES  Final diagnoses:  None     MEDICATIONS GIVEN DURING THIS VISIT:  Medications - No data to display   ED Discharge Orders    None      *Please note:  Jamie Wilkins was evaluated in Emergency Department on 02/14/2021 for the symptoms described in the history of present illness. She was evaluated in the context of the global COVID-19 pandemic, which necessitated consideration that the patient might be at risk for infection with the SARS-CoV-2 virus that causes COVID-19. Institutional protocols and algorithms that pertain to  the evaluation of patients at risk for COVID-19 are in a state of rapid change based on information released by regulatory bodies including the CDC and federal and state organizations. These policies and algorithms were followed during the patient's care in the ED.  Some ED evaluations and interventions may be delayed as a result of limited staffing during and after the pandemic.*  Note:  This document was  prepared using Systems analyst and may include unintentional dictation errors.

## 2021-02-14 NOTE — ED Notes (Addendum)
Critical results received from lab.  Ca 6.4 K 2.4 Dr York Cerise informed.

## 2021-02-14 NOTE — ED Triage Notes (Signed)
Pt c/o gradual, intermittent chest pain x 1 day. Pt sts tonight pain became worse and she had a syncopal episode at 2000. Pt sts she fell from standing and hit R shoulder then fell to the floor.  Denies SOB, diaphoresis, nausea.

## 2021-02-15 ENCOUNTER — Inpatient Hospital Stay: Payer: No Typology Code available for payment source

## 2021-02-15 ENCOUNTER — Emergency Department: Payer: No Typology Code available for payment source

## 2021-02-15 DIAGNOSIS — E876 Hypokalemia: Secondary | ICD-10-CM

## 2021-02-15 DIAGNOSIS — I480 Paroxysmal atrial fibrillation: Secondary | ICD-10-CM | POA: Diagnosis present

## 2021-02-15 DIAGNOSIS — K828 Other specified diseases of gallbladder: Secondary | ICD-10-CM | POA: Diagnosis present

## 2021-02-15 DIAGNOSIS — R079 Chest pain, unspecified: Secondary | ICD-10-CM

## 2021-02-15 DIAGNOSIS — R55 Syncope and collapse: Secondary | ICD-10-CM

## 2021-02-15 DIAGNOSIS — Z20822 Contact with and (suspected) exposure to covid-19: Secondary | ICD-10-CM | POA: Diagnosis present

## 2021-02-15 DIAGNOSIS — R748 Abnormal levels of other serum enzymes: Secondary | ICD-10-CM

## 2021-02-15 DIAGNOSIS — D520 Dietary folate deficiency anemia: Secondary | ICD-10-CM | POA: Diagnosis present

## 2021-02-15 DIAGNOSIS — R0789 Other chest pain: Secondary | ICD-10-CM | POA: Diagnosis present

## 2021-02-15 DIAGNOSIS — R918 Other nonspecific abnormal finding of lung field: Secondary | ICD-10-CM | POA: Diagnosis present

## 2021-02-15 DIAGNOSIS — F1010 Alcohol abuse, uncomplicated: Secondary | ICD-10-CM

## 2021-02-15 DIAGNOSIS — K7010 Alcoholic hepatitis without ascites: Secondary | ICD-10-CM | POA: Diagnosis present

## 2021-02-15 DIAGNOSIS — E869 Volume depletion, unspecified: Secondary | ICD-10-CM | POA: Diagnosis present

## 2021-02-15 DIAGNOSIS — Z720 Tobacco use: Secondary | ICD-10-CM

## 2021-02-15 DIAGNOSIS — Z6822 Body mass index (BMI) 22.0-22.9, adult: Secondary | ICD-10-CM | POA: Diagnosis not present

## 2021-02-15 DIAGNOSIS — R7401 Elevation of levels of liver transaminase levels: Secondary | ICD-10-CM | POA: Diagnosis not present

## 2021-02-15 DIAGNOSIS — D649 Anemia, unspecified: Secondary | ICD-10-CM

## 2021-02-15 DIAGNOSIS — I951 Orthostatic hypotension: Secondary | ICD-10-CM | POA: Diagnosis present

## 2021-02-15 DIAGNOSIS — I959 Hypotension, unspecified: Secondary | ICD-10-CM

## 2021-02-15 DIAGNOSIS — K921 Melena: Secondary | ICD-10-CM | POA: Diagnosis present

## 2021-02-15 DIAGNOSIS — R7989 Other specified abnormal findings of blood chemistry: Secondary | ICD-10-CM

## 2021-02-15 DIAGNOSIS — N179 Acute kidney failure, unspecified: Secondary | ICD-10-CM | POA: Diagnosis present

## 2021-02-15 DIAGNOSIS — Z79899 Other long term (current) drug therapy: Secondary | ICD-10-CM | POA: Diagnosis not present

## 2021-02-15 DIAGNOSIS — K760 Fatty (change of) liver, not elsewhere classified: Secondary | ICD-10-CM | POA: Diagnosis present

## 2021-02-15 DIAGNOSIS — F17210 Nicotine dependence, cigarettes, uncomplicated: Secondary | ICD-10-CM | POA: Diagnosis present

## 2021-02-15 DIAGNOSIS — I119 Hypertensive heart disease without heart failure: Secondary | ICD-10-CM | POA: Diagnosis present

## 2021-02-15 DIAGNOSIS — Z7951 Long term (current) use of inhaled steroids: Secondary | ICD-10-CM | POA: Diagnosis not present

## 2021-02-15 DIAGNOSIS — Z90710 Acquired absence of both cervix and uterus: Secondary | ICD-10-CM | POA: Diagnosis not present

## 2021-02-15 DIAGNOSIS — Z8249 Family history of ischemic heart disease and other diseases of the circulatory system: Secondary | ICD-10-CM | POA: Diagnosis not present

## 2021-02-15 DIAGNOSIS — E440 Moderate protein-calorie malnutrition: Secondary | ICD-10-CM | POA: Diagnosis present

## 2021-02-15 LAB — BASIC METABOLIC PANEL
Anion gap: 13 (ref 5–15)
BUN: 17 mg/dL (ref 6–20)
CO2: 36 mmol/L — ABNORMAL HIGH (ref 22–32)
Calcium: 6.5 mg/dL — ABNORMAL LOW (ref 8.9–10.3)
Chloride: 88 mmol/L — ABNORMAL LOW (ref 98–111)
Creatinine, Ser: 1.23 mg/dL — ABNORMAL HIGH (ref 0.44–1.00)
GFR, Estimated: 50 mL/min — ABNORMAL LOW (ref >60)
Glucose, Bld: 136 mg/dL — ABNORMAL HIGH (ref 70–99)
Potassium: 3.2 mmol/L — ABNORMAL LOW (ref 3.5–5.1)
Sodium: 137 mmol/L (ref 135–145)

## 2021-02-15 LAB — HEPATIC FUNCTION PANEL
ALT: 43 U/L (ref 0–44)
AST: 192 U/L — ABNORMAL HIGH (ref 15–41)
Albumin: 3.1 g/dL — ABNORMAL LOW (ref 3.5–5.0)
Alkaline Phosphatase: 156 U/L — ABNORMAL HIGH (ref 38–126)
Bilirubin, Direct: 0.4 mg/dL — ABNORMAL HIGH (ref 0.0–0.2)
Indirect Bilirubin: 0.7 mg/dL (ref 0.3–0.9)
Total Bilirubin: 1.1 mg/dL (ref 0.3–1.2)
Total Protein: 6 g/dL — ABNORMAL LOW (ref 6.5–8.1)

## 2021-02-15 LAB — HEPATITIS PANEL, ACUTE
HCV Ab: NONREACTIVE
Hep A IgM: NONREACTIVE
Hep B C IgM: NONREACTIVE
Hepatitis B Surface Ag: NONREACTIVE

## 2021-02-15 LAB — TROPONIN I (HIGH SENSITIVITY): Troponin I (High Sensitivity): 17 ng/L (ref <18)

## 2021-02-15 LAB — ETHANOL: Alcohol, Ethyl (B): <10 mg/dL (ref <10)

## 2021-02-15 LAB — CBC
HCT: 29.7 % — ABNORMAL LOW (ref 36.0–46.0)
Hemoglobin: 10.2 g/dL — ABNORMAL LOW (ref 12.0–15.0)
MCH: 36.4 pg — ABNORMAL HIGH (ref 26.0–34.0)
MCHC: 34.3 g/dL (ref 30.0–36.0)
MCV: 106.1 fL — ABNORMAL HIGH (ref 80.0–100.0)
Platelets: 218 10*3/uL (ref 150–400)
RBC: 2.8 MIL/uL — ABNORMAL LOW (ref 3.87–5.11)
RDW: 15.5 % (ref 11.5–15.5)
WBC: 3.8 10*3/uL — ABNORMAL LOW (ref 4.0–10.5)
nRBC: 0.5 % — ABNORMAL HIGH (ref 0.0–0.2)

## 2021-02-15 LAB — MAGNESIUM: Magnesium: 0.9 mg/dL — CL (ref 1.7–2.4)

## 2021-02-15 LAB — TSH: TSH: 1.314 u[IU]/mL (ref 0.350–4.500)

## 2021-02-15 LAB — HIV ANTIBODY (ROUTINE TESTING W REFLEX): HIV Screen 4th Generation wRfx: NONREACTIVE

## 2021-02-15 LAB — SARS CORONAVIRUS 2 (TAT 6-24 HRS): SARS Coronavirus 2: NEGATIVE

## 2021-02-15 MED ORDER — TRAZODONE HCL 50 MG PO TABS: 25.0000 mg | ORAL_TABLET | Freq: Every evening | ORAL | Status: DC | PRN

## 2021-02-15 MED ORDER — ONDANSETRON HCL 4 MG PO TABS: 4.0000 mg | ORAL_TABLET | Freq: Four times a day (QID) | ORAL | Status: DC | PRN

## 2021-02-15 MED ORDER — ACETAMINOPHEN 650 MG RE SUPP: 650.0000 mg | Freq: Four times a day (QID) | RECTAL | Status: DC | PRN

## 2021-02-15 MED ORDER — MAGNESIUM SULFATE 2 GM/50ML IV SOLN
2.0000 g | Freq: Once | INTRAVENOUS | Status: AC
  Administered 2021-02-15: 2 g via INTRAVENOUS

## 2021-02-15 MED ORDER — CALCIUM CARBONATE-VITAMIN D 500-200 MG-UNIT PO TABS
1.0000 | ORAL_TABLET | Freq: Two times a day (BID) | ORAL | Status: DC
  Administered 2021-02-15 – 2021-02-16 (×4): 1 via ORAL
  Filled 2021-02-15 (×6): qty 1

## 2021-02-15 MED ORDER — POTASSIUM CHLORIDE IN NACL 40-0.9 MEQ/L-% IV SOLN
INTRAVENOUS | Status: DC
  Filled 2021-02-15 (×4): qty 1000

## 2021-02-15 MED ORDER — FLUTICASONE PROPIONATE HFA 110 MCG/ACT IN AERO: 2.0000 | INHALATION_SPRAY | Freq: Two times a day (BID) | RESPIRATORY_TRACT | Status: DC

## 2021-02-15 MED ORDER — ENOXAPARIN SODIUM 40 MG/0.4ML ~~LOC~~ SOLN
40.0000 mg | SUBCUTANEOUS | Status: DC
  Filled 2021-02-15 (×2): qty 0.4

## 2021-02-15 MED ORDER — MAGNESIUM HYDROXIDE 400 MG/5ML PO SUSP: 30.0000 mL | Freq: Every day | ORAL | Status: DC | PRN

## 2021-02-15 MED ORDER — BUPROPION HCL ER (SR) 150 MG PO TB12
150.0000 mg | ORAL_TABLET | Freq: Two times a day (BID) | ORAL | Status: DC
Start: 2021-02-15 — End: 2021-02-15

## 2021-02-15 MED ORDER — FLUTICASONE PROPIONATE 50 MCG/ACT NA SUSP
2.0000 | Freq: Every day | NASAL | Status: DC
  Filled 2021-02-15 (×2): qty 16

## 2021-02-15 MED ORDER — POTASSIUM CHLORIDE CRYS ER 20 MEQ PO TBCR
40.0000 meq | EXTENDED_RELEASE_TABLET | Freq: Three times a day (TID) | ORAL | Status: DC
  Administered 2021-02-15: 40 meq via ORAL
  Filled 2021-02-15: qty 2

## 2021-02-15 MED ORDER — ATORVASTATIN CALCIUM 20 MG PO TABS
40.0000 mg | ORAL_TABLET | Freq: Every day | ORAL | Status: DC
  Administered 2021-02-15 – 2021-02-16 (×2): 40 mg via ORAL
  Filled 2021-02-15 (×2): qty 2

## 2021-02-15 MED ORDER — PANTOPRAZOLE SODIUM 40 MG PO TBEC
40.0000 mg | DELAYED_RELEASE_TABLET | Freq: Every day | ORAL | Status: DC
  Administered 2021-02-15 – 2021-02-16 (×2): 40 mg via ORAL
  Filled 2021-02-15 (×2): qty 1

## 2021-02-15 MED ORDER — ALBUTEROL SULFATE HFA 108 (90 BASE) MCG/ACT IN AERS: 2.0000 | INHALATION_SPRAY | RESPIRATORY_TRACT | Status: DC | PRN

## 2021-02-15 MED ORDER — BUDESONIDE 0.25 MG/2ML IN SUSP
0.2500 mg | Freq: Two times a day (BID) | RESPIRATORY_TRACT | Status: DC
  Administered 2021-02-15 – 2021-02-17 (×5): 0.25 mg via RESPIRATORY_TRACT
  Filled 2021-02-15 (×5): qty 2

## 2021-02-15 MED ORDER — IOHEXOL 350 MG/ML SOLN
75.0000 mL | Freq: Once | INTRAVENOUS | Status: AC | PRN
  Administered 2021-02-15: 75 mL via INTRAVENOUS

## 2021-02-15 MED ORDER — MONTELUKAST SODIUM 10 MG PO TABS
10.0000 mg | ORAL_TABLET | Freq: Every day | ORAL | Status: DC
Start: 2021-02-15 — End: 2021-02-17
  Administered 2021-02-15 – 2021-02-16 (×2): 10 mg via ORAL
  Filled 2021-02-15 (×3): qty 1

## 2021-02-15 MED ORDER — LOSARTAN POTASSIUM 50 MG PO TABS: 25.0000 mg | ORAL_TABLET | Freq: Every day | ORAL | Status: DC

## 2021-02-15 MED ORDER — ASPIRIN EC 81 MG PO TBEC
81.0000 mg | DELAYED_RELEASE_TABLET | Freq: Every day | ORAL | Status: DC
  Administered 2021-02-15 – 2021-02-16 (×2): 81 mg via ORAL
  Filled 2021-02-15 (×2): qty 1

## 2021-02-15 MED ORDER — ALBUTEROL SULFATE (2.5 MG/3ML) 0.083% IN NEBU: 2.5000 mg | INHALATION_SOLUTION | Freq: Four times a day (QID) | RESPIRATORY_TRACT | Status: DC | PRN

## 2021-02-15 MED ORDER — ONDANSETRON HCL 4 MG/2ML IJ SOLN: 4.0000 mg | Freq: Four times a day (QID) | INTRAMUSCULAR | Status: DC | PRN

## 2021-02-15 MED ORDER — ACETAMINOPHEN 325 MG PO TABS: 650.0000 mg | ORAL_TABLET | Freq: Four times a day (QID) | ORAL | Status: DC | PRN

## 2021-02-15 MED ORDER — AMLODIPINE BESYLATE 5 MG PO TABS: 10.0000 mg | ORAL_TABLET | Freq: Every day | ORAL | Status: DC

## 2021-02-15 MED ORDER — BUPROPION HCL ER (XL) 150 MG PO TB24
300.0000 mg | ORAL_TABLET | Freq: Every day | ORAL | Status: DC
  Administered 2021-02-15 – 2021-02-16 (×2): 300 mg via ORAL
  Filled 2021-02-15 (×2): qty 2

## 2021-02-15 NOTE — Progress Notes (Signed)
PHARMACY CONSULT NOTE - FOLLOW UP  Pharmacy Consult for Electrolyte Monitoring and Replacement   Recent Labs: Potassium (mmol/L)  Date Value  02/15/2021 3.2 (L)  05/03/2012 3.6   Magnesium (mg/dL)  Date Value  83/41/9622 0.9 (LL)   Calcium (mg/dL)  Date Value  29/79/8921 6.5 (L)   Calcium, Total (mg/dL)  Date Value  19/41/7408 8.6   Albumin (g/dL)  Date Value  14/48/1856 3.1 (L)  05/03/2012 3.6   Sodium (mmol/L)  Date Value  02/15/2021 137  05/03/2012 140     Assessment: 60 year old female admitted with chest pain. Electrolytes low on admission. Pharmacy consult for electrolyte replacement.  MIVF: NS 40K  Goal of Therapy:  Electrolytes WNL  Plan:  Magnesium 2 g IV x 2 + potassium 40 mEq PO x 2 doses. Also started on MIVF with potassium. Recheck of potassium 3.2 after administration of first PO dose of potassium. Will hold potassium TID dosing and continue with fluids. Recheck all electrolytes with morning labs.  Pricilla Riffle ,PharmD Clinical Pharmacist 02/15/2021 12:07 PM

## 2021-02-15 NOTE — Progress Notes (Signed)
[facility]  - [place] [facility]   PATIENT NAME: Jamie Wilkins    MR#:  [id number]  DATE OF BIRTH:  Mar 29, 1961  SUBJECTIVE:  patient came into the emergency room with chest pain. She is point tenderness on the right lateral external area. Claims to be drinking 1 to 2 drinks vodka on a daily basis. Denies any nausea vomiting.  REVIEW OF SYSTEMS:   Review of Systems  Constitutional: Positive for malaise/fatigue. Negative for chills, fever and weight loss.  HENT: Negative for ear discharge, ear pain and nosebleeds.   Eyes: Negative for blurred vision, pain and discharge.  Respiratory: Negative for sputum production, shortness of breath, wheezing and stridor.   Cardiovascular: Positive for chest pain. Negative for palpitations, orthopnea and PND.  Gastrointestinal: Negative for abdominal pain, diarrhea, nausea and vomiting.  Genitourinary: Negative for frequency and urgency.  Musculoskeletal: Negative for back pain and joint pain.  Neurological: Positive for weakness. Negative for sensory change, speech change and focal weakness.  Psychiatric/Behavioral: Negative for depression and hallucinations. The patient is not nervous/anxious.    Tolerating Diet:yes Tolerating PT:   DRUG ALLERGIES:  No Known Allergies  VITALS:  Blood pressure (!) 120/92, pulse 77, temperature 98.1 F (36.7 C), temperature source Oral, resp. rate 16, height 5\' 7"  (1.702 m), weight 68 kg, last menstrual period 05/11/2010, SpO2 100 %.  PHYSICAL EXAMINATION:   Physical Exam  GENERAL:  60 y.o.-year-old patient lying in the bed with no acute distress.   LUNGS: Normal breath sounds bilaterally, no wheezing, rales, rhonchi. No use of accessory muscles of respiration. Right costochondral pain on palalption CARDIOVASCULAR: S1, S2 normal. No murmurs, rubs, or gallops.  ABDOMEN: Soft, nontender, nondistended. Bowel sounds present. No organomegaly or mass.  EXTREMITIES: No cyanosis, clubbing or  edema b/l.    NEUROLOGIC: Cranial nerves II through XII are intact. No focal Motor or sensory deficits b/l.   PSYCHIATRIC:  patient is alert and oriented x 3.  SKIN: No obvious rash, lesion, or ulcer.   LABORATORY PANEL:  CBC Recent Labs  Lab 02/15/21 0522  WBC 3.8*  HGB 10.2*  HCT 29.7*  PLT 218    Chemistries  Recent Labs  Lab 02/14/21 2356 02/15/21 0522  NA  --  137  K  --  3.2*  CL  --  88*  CO2  --  36*  GLUCOSE  --  136*  BUN  --  17  CREATININE  --  1.23*  CALCIUM  --  6.5*  MG 0.9*  --   AST  --  192*  ALT  --  43  ALKPHOS  --  156*  BILITOT  --  1.1   Cardiac Enzymes No results for input(s): TROPONINI in the last 168 hours. RADIOLOGY:  DG Chest 2 View  Result Date: 02/14/2021 CLINICAL DATA:  Chest pain.  Syncopal episode. EXAM: CHEST - 2 VIEW COMPARISON:  Radiograph 02/10/2012 FINDINGS: The cardiomediastinal contours are normal. The lungs are clear. Pulmonary vasculature is normal. No consolidation, pleural effusion, or pneumothorax. No acute osseous abnormalities are seen. IMPRESSION: No acute chest findings. Electronically Signed   By: 02/12/2012 M.D.   On: 02/14/2021 23:31   CT Angio Chest PE W/Cm &/Or Wo Cm  Result Date: 02/15/2021 CLINICAL DATA:  Chest and abdominal pain EXAM: CT ANGIOGRAPHY CHEST CT ABDOMEN AND PELVIS WITH CONTRAST TECHNIQUE: Multidetector CT imaging of the chest was performed using the standard protocol during bolus administration of intravenous contrast. Multiplanar CT image reconstructions  and MIPs were obtained to evaluate the vascular anatomy. Multidetector CT imaging of the abdomen and pelvis was performed using the standard protocol during bolus administration of intravenous contrast. CONTRAST:  21mL OMNIPAQUE IOHEXOL 350 MG/ML SOLN COMPARISON:  CT abdomen pelvis 02/19/2006 FINDINGS: CTA CHEST FINDINGS Cardiovascular: Contrast injection is sufficient to demonstrate satisfactory opacification of the pulmonary arteries to the  segmental level. There is no pulmonary embolus. The main pulmonary artery is within normal limits for size. There is no CT evidence of acute right heart strain. There is calcific aortic atherosclerosis. There is a normal 3-vessel arch branching pattern. Heart size is normal, without pericardial effusion. Mediastinum/Nodes: No mediastinal, hilar or axillary lymphadenopathy. The visualized thyroid and thoracic esophageal course are unremarkable. Lungs/Pleura: 6 mm right middle lobe pulmonary nodule (series 6, image 36). 4 mm right lower lobe pulmonary nodule (series 6, image 18). No pleural effusion or pneumothorax. No focal airspace consolidation. No focal pleural abnormality. Musculoskeletal: No chest wall abnormality. No acute or significant osseous findings. Review of the MIP images confirms the above findings. CT ABDOMEN and PELVIS FINDINGS Hepatobiliary: Hepatic steatosis.  Gallbladder is unremarkable. Pancreas: Normal contours without ductal dilatation. No peripancreatic fluid collection. Spleen: Normal. Adrenals/Urinary Tract: --Adrenal glands: Normal. --Right kidney/ureter: No hydronephrosis or perinephric stranding. No nephrolithiasis. No obstructing ureteral stones. --Left kidney/ureter: No hydronephrosis or perinephric stranding. No nephrolithiasis. No obstructing ureteral stones. --Urinary bladder: Unremarkable. Stomach/Bowel: --Stomach/Duodenum: No hiatal hernia or other gastric abnormality. Normal duodenal course and caliber. --Small bowel: No dilatation or inflammation. --Colon: No focal abnormality. --Appendix: Normal. Vascular/Lymphatic: Atherosclerotic calcification is present within the non-aneurysmal abdominal aorta, without hemodynamically significant stenosis. No abdominal or pelvic lymphadenopathy. Reproductive: Status post hysterectomy. No adnexal mass. Musculoskeletal. No bony spinal canal stenosis or focal osseous abnormality. Other: None. IMPRESSION: 1. No pulmonary embolus or acute aortic  syndrome. 2. No acute abnormality of the chest, abdomen or pelvis. 3. Hepatic steatosis. 4. Pulmonary nodules measuring up to 6 mm, unchanged compared to 02/19/2006 and therefore benign. Aortic Atherosclerosis (ICD10-I70.0). Electronically Signed   By: Deatra Robinson M.D.   On: 02/15/2021 00:48   CT ABDOMEN PELVIS W CONTRAST  Result Date: 02/15/2021 CLINICAL DATA:  Chest and abdominal pain EXAM: CT ANGIOGRAPHY CHEST CT ABDOMEN AND PELVIS WITH CONTRAST TECHNIQUE: Multidetector CT imaging of the chest was performed using the standard protocol during bolus administration of intravenous contrast. Multiplanar CT image reconstructions and MIPs were obtained to evaluate the vascular anatomy. Multidetector CT imaging of the abdomen and pelvis was performed using the standard protocol during bolus administration of intravenous contrast. CONTRAST:  55mL OMNIPAQUE IOHEXOL 350 MG/ML SOLN COMPARISON:  CT abdomen pelvis 02/19/2006 FINDINGS: CTA CHEST FINDINGS Cardiovascular: Contrast injection is sufficient to demonstrate satisfactory opacification of the pulmonary arteries to the segmental level. There is no pulmonary embolus. The main pulmonary artery is within normal limits for size. There is no CT evidence of acute right heart strain. There is calcific aortic atherosclerosis. There is a normal 3-vessel arch branching pattern. Heart size is normal, without pericardial effusion. Mediastinum/Nodes: No mediastinal, hilar or axillary lymphadenopathy. The visualized thyroid and thoracic esophageal course are unremarkable. Lungs/Pleura: 6 mm right middle lobe pulmonary nodule (series 6, image 36). 4 mm right lower lobe pulmonary nodule (series 6, image 18). No pleural effusion or pneumothorax. No focal airspace consolidation. No focal pleural abnormality. Musculoskeletal: No chest wall abnormality. No acute or significant osseous findings. Review of the MIP images confirms the above findings. CT ABDOMEN and PELVIS FINDINGS  Hepatobiliary: Hepatic steatosis.  Gallbladder is unremarkable. Pancreas: Normal contours without ductal dilatation. No peripancreatic fluid collection. Spleen: Normal. Adrenals/Urinary Tract: --Adrenal glands: Normal. --Right kidney/ureter: No hydronephrosis or perinephric stranding. No nephrolithiasis. No obstructing ureteral stones. --Left kidney/ureter: No hydronephrosis or perinephric stranding. No nephrolithiasis. No obstructing ureteral stones. --Urinary bladder: Unremarkable. Stomach/Bowel: --Stomach/Duodenum: No hiatal hernia or other gastric abnormality. Normal duodenal course and caliber. --Small bowel: No dilatation or inflammation. --Colon: No focal abnormality. --Appendix: Normal. Vascular/Lymphatic: Atherosclerotic calcification is present within the non-aneurysmal abdominal aorta, without hemodynamically significant stenosis. No abdominal or pelvic lymphadenopathy. Reproductive: Status post hysterectomy. No adnexal mass. Musculoskeletal. No bony spinal canal stenosis or focal osseous abnormality. Other: None. IMPRESSION: 1. No pulmonary embolus or acute aortic syndrome. 2. No acute abnormality of the chest, abdomen or pelvis. 3. Hepatic steatosis. 4. Pulmonary nodules measuring up to 6 mm, unchanged compared to 02/19/2006 and therefore benign. Aortic Atherosclerosis (ICD10-I70.0). Electronically Signed   By: Deatra RobinsonKevin  Herman M.D.   On: 02/15/2021 00:48   US Abdomen Limited RUQ (LIVER/GB)  Result Date: 02/15/2021 CLINICAL DATA:  Elevated liver function tests EXAM: ULTRASOUND ABDOMEN LIMITED RIGHT UPPER QUADRANT COMPARISON:  Abdominal CT from earlier today FINDINGS: Gallbladder: Gallbladder sludge.  No stone, wall thickening, or focal tenderness. Common bile duct: Diameter: 5 mm Liver: Diffusely echogenic liver. No focal lesion is seen. Portal vein is patent on color Doppler imaging with normal direction of blood flow towards the liver. IMPRESSION: 1. Gallbladder sludge.  No evidence of cholecystitis.  2. Hepatic steatosis. Electronically Signed   By: Marnee SpringJonathon  Watts M.D.   On: 02/15/2021 04:53   ASSESSMENT AND PLAN:  Elige KoJacqueline D Harbin is a 60 y.o. female with medical history significant for hypertension, asthma and tobacco abuse, presented to the emergency room the onset of central chest pain felt as pressure and tightness and graded occasionally at 10/10 in severity with no nausea, vomiting or diaphoresis, palpitations or dyspnea.  Chest pain, with associated syncope, ruled out acute coronary syndrome-- negative cardiac enzyme and no acute EKG changes chest pain is atypical appears costochondritis given palpation -serial cardiac enzymes times two negative. [facility] Cardiology consult and 2D echo  --asa po daily   alcoholic hepatitis andelevated lipase level that could be related to mild early pancreatitis not currently appearing on abdominal CT  -ultrasound abdomen shows hepatic steatosis. -- Patient advised to abstain from drinking alcohol -- LFTs trending down  Severe hypomagnesemia with associated hypokalemia. - replace potassium and magnesium with p.o. and IV supplementation.  Atrial fibrillation with controlled ventricular response, apparently newly diagnosed. -2D echo and a cardiology consult to be obtained as mentioned above. -- Replace electrolytes --- patient now in sinus rhythm   Hypocalcemia. -Received PO calcium  DVT prophylaxis: Lovenox. Code Status: full code. Family Communication:  none Disposition Plan: Back to previous home environment Consults called: Cardiology consult. Status is: Inpatient  Remains inpatient appropriate because:Ongoing active pain requiring inpatient pain management, Ongoing diagnostic testing needed not appropriate for outpatient work up  Dispo: The patient is from: Home  Anticipated d/c is to: Home  Patient currently is not medically stable to d/c.              Difficult to place patient No  Level of  care: Progressive Cardiac Status is: Inpatient  TOC for substance abuse        TOTAL TIME TAKING CARE OF THIS PATIENT: *30* minutes.  >50% time spent on counselling and coordination of care  Note: This dictation was prepared with Dragon dictation along with  smaller phrase technology. Any transcriptional errors that result from this process are unintentional.  Enedina Finner M.D    [facility]   CC: Primary care physician; Veneda Melter, FNPPatient ID: Elige Ko, female   DOB: Sep 23, 1961, 60 y.o.   MRN: [id number]

## 2021-02-15 NOTE — ED Notes (Signed)
Critical result received: mag 0.9 Dr York Cerise informed.

## 2021-02-15 NOTE — H&P (Addendum)
[place]   PATIENT NAME: Jamie Wilkins    MR#:  [id number]  DATE OF BIRTH:  03/09/1961  DATE OF ADMISSION:  02/14/2021  PRIMARY CARE PHYSICIAN: Arlis Porta, FNP   Patient is coming from: Home.  REQUESTING/REFERRING PHYSICIAN: Hinda Kehr, MD. CHIEF COMPLAINT:   Chief Complaint  Patient presents with  . Chest Pain    HISTORY OF PRESENT ILLNESS:  Jamie Wilkins is a 60 y.o. female with medical history significant for hypertension, asthma and tobacco abuse, presented to the emergency room the onset of central chest pain felt as pressure and tightness and graded occasionally at 10/10 in severity with no nausea, vomiting or diaphoresis, palpitations or dyspnea.  She has been having intermittent chest pain over the last couple of days and today while at work had brief syncope for a few seconds while having her chest pain, falling and hitting her right arm.  He denied any paresthesias or focal muscle weakness.  No head injury during his fall.  She drinks about 1 to 2 glasses of vodka daily and has not been drinking for the last 3 days.  No dysuria, oliguria or hematuria or flank pain.  No abdominal pain.  No bleeding diathesis.  ED Course: Upon presentation to the emergency room vital signs were within normal.  Labs revealed hypokalemia of 2.4 hypomagnesemia of 0.8, creatinine of 1.34 and low calcium of 6.4, alk phos 148 with AST of 269 ALT of 50 with total protein of 6.4 and albumin of 3.3.  Serum lipase was elevated at 333.  High-sensitivity troponin I was 17 twice.  CBC showed anemia with hemoglobin 10.8 hematocrit 31.7 with macrocytosis.  Alcohol levels less than 10   EKG as reviewed by me : Showed atrial fibrillation with controlled ventricular response of 84, prolonged QT interval with QTC of 493 MS and T wave inversion anterolaterally Imaging: Chest x-ray showed no acute cardiopulmonary disease.  Chest CTA revealed pulmonary nodules measuring up to 6 mm that are  unchanged compared to 02/19/2006 and therefore benign, aortic atherosclerosis with no PE or acute findings.  Abdominal pelvic CT scan showed hepatic steatosis with no acute findings.  The patient was given 1 g of IV calcium gluconate, 2 g of IV magnesium sulfate, 40 mEq p.o. potassium chloride and 500 mill IV normal saline bolus.  She will be admitted to a progressive unit bed for further evaluation and management.   PAST MEDICAL HISTORY:   Past Medical History:  Diagnosis Date  . Asthma   . Heavy menstrual period   . Hypertension     PAST SURGICAL HISTORY:   Past Surgical History:  Procedure Laterality Date  . ABDOMINAL HYSTERECTOMY     partial  . FOOT SURGERY    . S/P BTL      SOCIAL HISTORY:   Social History   Tobacco Use  . Smoking status: Current Every Day Smoker    Packs/day: 0.50    Types: Cigarettes  . Smokeless tobacco: Never Used  Substance Use Topics  . Alcohol use: Yes    Alcohol/week: 7.0 standard drinks    Types: 7 Standard drinks or equivalent per week    FAMILY HISTORY:  Positive for diabetes mellitus in both parents and hypertension in her father and 2 brothers.  DRUG ALLERGIES:  No Known Allergies  REVIEW OF SYSTEMS:   ROS As per history of present illness. All pertinent systems were reviewed above. Constitutional, HEENT, cardiovascular, respiratory, GI, GU, musculoskeletal, neuro,  psychiatric, endocrine, integumentary and hematologic systems were reviewed and are otherwise negative/unremarkable except for positive findings mentioned above in the HPI.   MEDICATIONS AT HOME:   Prior to Admission medications   Medication Sig Start Date End Date Taking? Authorizing Provider  albuterol (PROVENTIL) (2.5 MG/3ML) 0.083% nebulizer solution Take 2.5 mg by nebulization every 6 (six) hours as needed for wheezing or shortness of breath.   Yes [provider]  amLODipine (NORVASC) 10 MG tablet Take 10 mg by mouth daily.   Yes [provider]   atorvastatin (LIPITOR) 40 MG tablet Take 40 mg by mouth daily.   Yes [provider]  buPROPion (WELLBUTRIN SR) 150 MG 12 hr tablet Take 150 mg by mouth 2 (two) times daily.   Yes [provider]  buPROPion (WELLBUTRIN XL) 150 MG 24 hr tablet Take 300 mg by mouth daily. 01/14/21  Yes [provider]  CALCIUM 600+D 600-400 MG-UNIT tablet Take 1 tablet by mouth 2 (two) times daily. 10/20/20  Yes [provider]  FLOVENT HFA 110 MCG/ACT inhaler Inhale 2 puffs into the lungs 2 (two) times daily. 01/14/21  Yes [provider]  fluticasone (FLONASE) 50 MCG/ACT nasal spray Place 2 sprays into both nostrils daily.   Yes [provider]  hydrochlorothiazide (HYDRODIURIL) 25 MG tablet Take 25 mg by mouth daily. for high blood pressure 09/18/20  Yes [provider]  ibuprofen (ADVIL) 800 MG tablet Take 1 tablet (800 mg total) by mouth every 8 (eight) hours as needed. 10/06/20  Yes Sharion Balloon, NP  KLOR-CON M20 20 MEQ tablet Take 2 tablets by mouth 3 (three) times daily. 01/14/21  Yes [provider]  losartan (COZAAR) 25 MG tablet Take 25 mg by mouth daily.   Yes [provider]  montelukast (SINGULAIR) 10 MG tablet Take 10 mg by mouth at bedtime. 08/07/20  Yes [provider]  omeprazole (PRILOSEC) 40 MG capsule Take 40 mg by mouth daily.   Yes [provider]  PROAIR HFA 108 [phone] Base) MCG/ACT inhaler 2 puff  every four to six hours as needed for wheezing, shortness of breath 10/20/20  Yes [provider]  mupirocin ointment (BACTROBAN) 2 % 1 application 2 (two) times daily. Patient not taking: No sig reported    [provider]  triamcinolone (KENALOG) 0.1 % Apply 1 application topically 2 (two) times daily. Patient not taking: No sig reported    [provider]      VITAL SIGNS:  Blood pressure 101/77, pulse 76, temperature 98.5 F (36.9 C), resp. rate (!) 25, height 5' 7"  (1.702 m), weight 68 kg, last menstrual period 05/11/2010, SpO2 96 %.  PHYSICAL EXAMINATION:  Physical Exam  GENERAL:  60 y.o.-year-old female patient lying in the bed with no acute distress.  EYES: Pupils equal, round, reactive to light and accommodation. No scleral icterus. Extraocular muscles intact.  HEENT: Head atraumatic, normocephalic. Oropharynx and nasopharynx clear.  NECK:  Supple, no jugular venous distention. No thyroid enlargement, no tenderness.  LUNGS: Normal breath sounds bilaterally, no wheezing, rales,rhonchi or crepitation. No use of accessory muscles of respiration.  CARDIOVASCULAR: Irregularly irregular rhythm, S1, S2 normal. No murmurs, rubs, or gallops.  ABDOMEN: Soft, nondistended with left upper quadrant tenderness without rebound tenderness guarding or rigidity.  Bowel sounds present. No organomegaly or mass.  EXTREMITIES: No pedal edema, cyanosis, or clubbing.  NEUROLOGIC: Cranial nerves II through XII are intact. Muscle strength 5/5 in all extremities. Sensation intact. Gait not  checked.  PSYCHIATRIC: The patient is alert and oriented x 3.  Normal affect and good eye contact. SKIN: No obvious rash, lesion, or ulcer.   LABORATORY PANEL:   CBC Recent Labs  Lab 02/14/21 2253  WBC 4.1  HGB 10.8*  HCT 31.7*  PLT 236   ------------------------------------------------------------------------------------------------------------------  Chemistries  Recent Labs  Lab 02/14/21 2253 02/14/21 2356  NA 137  --   K 2.4*  --   CL 83*  --   CO2 39*  --   GLUCOSE 126*  --   BUN 19  --   CREATININE 1.35*  --   CALCIUM 6.4*  --   MG  --  0.9*  AST 269*  --   ALT 50*  --   ALKPHOS 148*  --   BILITOT 1.2  --    ------------------------------------------------------------------------------------------------------------------  Cardiac Enzymes No results for input(s): TROPONINI in the last 168  hours. ------------------------------------------------------------------------------------------------------------------  RADIOLOGY:  DG Chest 2 View  Result Date: 02/14/2021 CLINICAL DATA:  Chest pain.  Syncopal episode. EXAM: CHEST - 2 VIEW COMPARISON:  Radiograph 02/10/2012 FINDINGS: The cardiomediastinal contours are normal. The lungs are clear. Pulmonary vasculature is normal. No consolidation, pleural effusion, or pneumothorax. No acute osseous abnormalities are seen. IMPRESSION: No acute chest findings. Electronically Signed   By: Melanie  Sanford M.D.   On: 02/14/2021 23:31   CT Angio Chest PE W/Cm &/Or Wo Cm  Result Date: 02/15/2021 CLINICAL DATA:  Chest and abdominal pain EXAM: CT ANGIOGRAPHY CHEST CT ABDOMEN AND PELVIS WITH CONTRAST TECHNIQUE: Multidetector CT imaging of the chest was performed using the standard protocol during bolus administration of intravenous contrast. Multiplanar CT image reconstructions and MIPs were obtained to evaluate the vascular anatomy. Multidetector CT imaging of the abdomen and pelvis was performed using the standard protocol during bolus administration of intravenous contrast. CONTRAST:  75mL OMNIPAQUE IOHEXOL 350 MG/ML SOLN COMPARISON:  CT abdomen pelvis 02/19/2006 FINDINGS: CTA CHEST FINDINGS Cardiovascular: Contrast injection is sufficient to demonstrate satisfactory opacification of the pulmonary arteries to the segmental level. There is no pulmonary embolus. The main pulmonary artery is within normal limits for size. There is no CT evidence of acute right heart strain. There is calcific aortic atherosclerosis. There is a normal 3-vessel arch branching pattern. Heart size is normal, without pericardial effusion. Mediastinum/Nodes: No mediastinal, hilar or axillary lymphadenopathy. The visualized thyroid and thoracic esophageal course are unremarkable. Lungs/Pleura: 6 mm right middle lobe pulmonary nodule (series 6, image 36). 4 mm right lower lobe pulmonary  nodule (series 6, image 18). No pleural effusion or pneumothorax. No focal airspace consolidation. No focal pleural abnormality. Musculoskeletal: No chest wall abnormality. No acute or significant osseous findings. Review of the MIP images confirms the above findings. CT ABDOMEN and PELVIS FINDINGS Hepatobiliary: Hepatic steatosis.  Gallbladder is unremarkable. Pancreas: Normal contours without ductal dilatation. No peripancreatic fluid collection. Spleen: Normal. Adrenals/Urinary Tract: --Adrenal glands: Normal. --Right kidney/ureter: No hydronephrosis or perinephric stranding. No nephrolithiasis. No obstructing ureteral stones. --Left kidney/ureter: No hydronephrosis or perinephric stranding. No nephrolithiasis. No obstructing ureteral stones. --Urinary bladder: Unremarkable. Stomach/Bowel: --Stomach/Duodenum: No hiatal hernia or other gastric abnormality. Normal duodenal course and caliber. --Small bowel: No dilatation or inflammation. --Colon: No focal abnormality. --Appendix: Normal. Vascular/Lymphatic: Atherosclerotic calcification is present within the non-aneurysmal abdominal aorta, without hemodynamically significant stenosis. No abdominal or pelvic lymphadenopathy. Reproductive: Status post hysterectomy. No adnexal mass. Musculoskeletal. No bony spinal canal stenosis or focal osseous abnormality. Other: None. IMPRESSION: 1. No pulmonary embolus or acute aortic syndrome.   2. No acute abnormality of the chest, abdomen or pelvis. 3. Hepatic steatosis. 4. Pulmonary nodules measuring up to 6 mm, unchanged compared to 02/19/2006 and therefore benign. Aortic Atherosclerosis (ICD10-I70.0). Electronically Signed   By: Ulyses Jarred M.D.   On: 02/15/2021 00:48   CT ABDOMEN PELVIS W CONTRAST  Result Date: 02/15/2021 CLINICAL DATA:  Chest and abdominal pain EXAM: CT ANGIOGRAPHY CHEST CT ABDOMEN AND PELVIS WITH CONTRAST TECHNIQUE: Multidetector CT imaging of the chest was performed using the standard protocol during  bolus administration of intravenous contrast. Multiplanar CT image reconstructions and MIPs were obtained to evaluate the vascular anatomy. Multidetector CT imaging of the abdomen and pelvis was performed using the standard protocol during bolus administration of intravenous contrast. CONTRAST:  37m OMNIPAQUE IOHEXOL 350 MG/ML SOLN COMPARISON:  CT abdomen pelvis 02/19/2006 FINDINGS: CTA CHEST FINDINGS Cardiovascular: Contrast injection is sufficient to demonstrate satisfactory opacification of the pulmonary arteries to the segmental level. There is no pulmonary embolus. The main pulmonary artery is within normal limits for size. There is no CT evidence of acute right heart strain. There is calcific aortic atherosclerosis. There is a normal 3-vessel arch branching pattern. Heart size is normal, without pericardial effusion. Mediastinum/Nodes: No mediastinal, hilar or axillary lymphadenopathy. The visualized thyroid and thoracic esophageal course are unremarkable. Lungs/Pleura: 6 mm right middle lobe pulmonary nodule (series 6, image 36). 4 mm right lower lobe pulmonary nodule (series 6, image 18). No pleural effusion or pneumothorax. No focal airspace consolidation. No focal pleural abnormality. Musculoskeletal: No chest wall abnormality. No acute or significant osseous findings. Review of the MIP images confirms the above findings. CT ABDOMEN and PELVIS FINDINGS Hepatobiliary: Hepatic steatosis.  Gallbladder is unremarkable. Pancreas: Normal contours without ductal dilatation. No peripancreatic fluid collection. Spleen: Normal. Adrenals/Urinary Tract: --Adrenal glands: Normal. --Right kidney/ureter: No hydronephrosis or perinephric stranding. No nephrolithiasis. No obstructing ureteral stones. --Left kidney/ureter: No hydronephrosis or perinephric stranding. No nephrolithiasis. No obstructing ureteral stones. --Urinary bladder: Unremarkable. Stomach/Bowel: --Stomach/Duodenum: No hiatal hernia or other gastric  abnormality. Normal duodenal course and caliber. --Small bowel: No dilatation or inflammation. --Colon: No focal abnormality. --Appendix: Normal. Vascular/Lymphatic: Atherosclerotic calcification is present within the non-aneurysmal abdominal aorta, without hemodynamically significant stenosis. No abdominal or pelvic lymphadenopathy. Reproductive: Status post hysterectomy. No adnexal mass. Musculoskeletal. No bony spinal canal stenosis or focal osseous abnormality. Other: None. IMPRESSION: 1. No pulmonary embolus or acute aortic syndrome. 2. No acute abnormality of the chest, abdomen or pelvis. 3. Hepatic steatosis. 4. Pulmonary nodules measuring up to 6 mm, unchanged compared to 02/19/2006 and therefore benign. Aortic Atherosclerosis (ICD10-I70.0). Electronically Signed   By: KUlyses JarredM.D.   On: 02/15/2021 00:48      IMPRESSION AND PLAN:  Active Problems:   Chest pain  1.  Chest pain, with associated syncope, rule out acute coronary syndrome. -The patient will be admitted to a progressive unit bed. -We will follow serial troponin I's. -We will check orthostatics. -We will optimize electrolytes. -Cardiology consult and 2D echo will be obtained. -Notified Dr. NAcie Fredricksonabout the patient.  2.  Elevated lipase level that could be related to mild early pancreatitis not currently appearing on abdominal CT as well as elevated transaminases. -We will keep the patient n.p.o. and hydrate. -Serial lipase levels will be followed. -We will obtain right upper quadrant ultrasound. -We will follow LFTs. -We will obtain acute hepatitis panel.  3.  Severe hypomagnesemia with associated hypokalemia. -We will aggressively replace potassium and magnesium with p.o. and IV supplementation.  4.  Atrial fibrillation with controlled ventricular response, apparently newly diagnosed. -2D echo and a cardiology consult to be obtained as mentioned above.  5.  Hypocalcemia. -Calcium will be replaced and calcium  level will be followed.  DVT prophylaxis: Lovenox. Code Status: full code. Family Communication:  The plan of care was discussed in details with the patient (and family). I answered all questions. The patient agreed to proceed with the above mentioned plan. Further management will depend upon hospital course. Disposition Plan: Back to previous home environment Consults called: Cardiology consult. All the records are reviewed and case discussed with ED provider.  Status is: Inpatient  Remains inpatient appropriate because:Ongoing active pain requiring inpatient pain management, Ongoing diagnostic testing needed not appropriate for outpatient work up, Unsafe d/c plan, IV treatments appropriate due to intensity of illness or inability to take PO and Inpatient level of care appropriate due to severity of illness   Dispo: The patient is from: Home              Anticipated d/c is to: Home              Patient currently is not medically stable to d/c.   Difficult to place patient No   TOTAL TIME TAKING CARE OF THIS PATIENT: 55 minutes.    Jan A Mansy M.D on 02/15/2021 at 2:12 AM  [facility]   From 7 PM-7 AM, contact night-coverage [email or web address]  CC: Primary care physician; White, Christina M, FNP 

## 2021-02-15 NOTE — ED Notes (Signed)
Patient returned from CT

## 2021-02-15 NOTE — ED Notes (Signed)
Attempted to visit patient for routine admission rounds by leaders. Patient currently being evaluated by staff member (possibly physician). Unable to visit at this time.

## 2021-02-15 NOTE — ED Notes (Signed)
Pt awake and alert - GCS 15.  Denies dizziness at this time; s/p syncopal episode day of arrival.  Does report ongoing midsternal chest pain which is constant - unable to describe quality but states pain has since improved since onset.  Bedside u/s RUQ completed (re: elevated liver enzymes) and pt now eating an express meal sandwich.   Continuous cardiac and pulse ox monitoring maintained.  Will monitor for acute changes and maintain plan of care.

## 2021-02-15 NOTE — ED Notes (Signed)
Patient requesting food. Per EDP patient to be NPO until CT resulted. Educated patient.

## 2021-02-15 NOTE — Consult Note (Addendum)
Cardiology Consultation:   Patient ID: Jamie Wilkins MRN: [id number]; DOB: 1961-05-05  Admit date: 02/14/2021 Date of Consult: 02/15/2021  PCP:  Arlis Porta, [place]  Cardiologist:  [facility], Dr. Fletcher Anon rounding Advanced Practice Provider:  No care team member to display Electrophysiologist:  None :[id number]}    Patient Profile:   Jamie Wilkins is a 60 y.o. female with a hx of current tobacco and EtOH use, HTN, and asthma, and who is being seen today for the evaluation of  CP, syncope, and new onset Afib at the request of Dr. Sidney Ace.  History of Present Illness:   Jamie Wilkins is a 60 yo female with PMH as above. She has FHx of heart disease / early cardiac death, including a father and 2 brothers deceased from cardiac disease at ages 7, 76, and 61 years of age.She is a current smoker with 1/4 pack per day. She reports current EtOH use drinks about 2 of 12 oz cups of 1/2 liquor/vodka and 1/2 soda per day.   She denies any personal history of heart failure or arrhythmia.  She does not have a regular cardiologist.  She does take blood pressure medication. She reports recently stopping BP medication due to lower pressure at her doctors. No regular physical activity, other than walking her dog ([place]ahah).   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  She has not had anything to drink over the last 2 days and denies any withdrawal sx. She does reports dark and tarry stools or melena x1 week. She has been "drinking pepto bismol" during that time. On Thursday or Friday (3/24 or 3/25), she had sharp CP at rest in bed. Pain was sharp and lasted 2-3 minutes and usually less than 5-6 minutes. It was also described as positional, pleuritic, and TTP. No radiation or associated sx. She denies having CP like this before in the past.  She was at work on Saturday 3/26 and felt very dizzy. On 3/27, she then was training for her position yesterday 3/27 when she had a  syncopal event. She reports she was not unconscious for very long and did not hit her head.  She denies having any residual bruising/ecchymosis or injuries after the fall.  She did hit her right arm.  She has never passeed out before in the past. She then went home, during which time she had recurrent CP that was again TTP.  She denies any recent physical activity and is relatively sedentary as above.  No recent heavy lifting.  She does report that she has not been eating very much lately.  In the ED, labs showed AKI, hypokalemia, hypomagnesia, hypocalcemia, elevated liver enzymes, anemia, leukopenia, hyperglycemia, and hypoalbuminemia.  Specifically, potassium 2.4, magnesium 0.8, calcium 6.5, creatinine 1.34 (Cr baseline 0.7-0.8), BUN 17, sodium 137, glucose 136, alk phosphatase 156, albumin 3.1, AST 192, ALT 43, hemoglobin 10.2 (baseline unknown), hematocrit 29.7.  CT showed no evidence of pulmonary embolism or acute aortic syndrome, hepatic steatosis, and pulmonary nodules measuring up to 6 mm/unchanged, suspected benign.  RUQ ultrasound showed gallbladder sludge without evidence of cholecystitis and hepatic steatosis. HS Tn 17 x2. EKG showed atrial fibrillation with ventricular rate 84 bpm and nonspecific ST/T changes with poor R wave progression V1 to V2.  During today's exam, she is very TTP along the right chest. She also reports a pulling when attempting to lay flat today (I was lowering the bed to see if any orthopnea,  and she reported CP with the change in position).  Past Medical History:  Diagnosis Date  . Asthma   . Heavy menstrual period   . Hypertension     Past Surgical History:  Procedure Laterality Date  . ABDOMINAL HYSTERECTOMY     partial  . FOOT SURGERY    . S/P BTL       Home Medications:  Prior to Admission medications   Medication Sig Start Date End Date Taking? Authorizing Provider  albuterol (PROVENTIL) (2.5 MG/3ML) 0.083% nebulizer solution Take 2.5 mg by  nebulization every 6 (six) hours as needed for wheezing or shortness of breath.   Yes [provider]  amLODipine (NORVASC) 10 MG tablet Take 10 mg by mouth daily.   Yes [provider]  atorvastatin (LIPITOR) 40 MG tablet Take 40 mg by mouth daily.   Yes [provider]  buPROPion (WELLBUTRIN SR) 150 MG 12 hr tablet Take 150 mg by mouth 2 (two) times daily.   Yes [provider]  buPROPion (WELLBUTRIN XL) 150 MG 24 hr tablet Take 300 mg by mouth daily. 01/14/21  Yes [provider]  CALCIUM 600+D 600-400 MG-UNIT tablet Take 1 tablet by mouth 2 (two) times daily. 10/20/20  Yes [provider]  FLOVENT HFA 110 MCG/ACT inhaler Inhale 2 puffs into the lungs 2 (two) times daily. 01/14/21  Yes [provider]  fluticasone (FLONASE) 50 MCG/ACT nasal spray Place 2 sprays into both nostrils daily.   Yes [provider]  hydrochlorothiazide (HYDRODIURIL) 25 MG tablet Take 25 mg by mouth daily. for high blood pressure 09/18/20  Yes [provider]  ibuprofen (ADVIL) 800 MG tablet Take 1 tablet (800 mg total) by mouth every 8 (eight) hours as needed. 10/06/20  Yes Sharion Balloon, NP  KLOR-CON M20 20 MEQ tablet Take 2 tablets by mouth 3 (three) times daily. 01/14/21  Yes [provider]  losartan (COZAAR) 25 MG tablet Take 25 mg by mouth daily.   Yes [provider]  montelukast (SINGULAIR) 10 MG tablet Take 10 mg by mouth at bedtime. 08/07/20  Yes [provider]  omeprazole (PRILOSEC) 40 MG capsule Take 40 mg by mouth daily.   Yes [provider]  PROAIR HFA 108 [phone] Base) MCG/ACT inhaler 2 puff  every four to six hours as needed for wheezing, shortness of breath 10/20/20  Yes [provider]  mupirocin ointment (BACTROBAN) 2 % 1 application 2 (two) times daily. Patient not taking: No sig reported    [provider]  triamcinolone (KENALOG) 0.1 % Apply 1 application topically 2  (two) times daily. Patient not taking: No sig reported    [provider]    Inpatient Medications: Scheduled Meds: . aspirin EC  81 mg Oral Daily  . atorvastatin  40 mg Oral Daily  . budesonide (PULMICORT) nebulizer solution  0.25 mg Nebulization BID  . buPROPion  300 mg Oral Daily  . calcium-vitamin D  1 tablet Oral BID  . enoxaparin (LOVENOX) injection  40 mg Subcutaneous Q24H  . fluticasone  2 spray Each Nare Daily  . montelukast  10 mg Oral QHS  . pantoprazole  40 mg Oral Daily   Continuous Infusions: . 0.9 % NaCl with KCl 40 mEq / L 100 mL/hr at 02/15/21 1059   PRN Meds:  Allergies:   No Known Allergies  Social History:   Social History   Socioeconomic History  . Marital status: Divorced    Spouse name:  Not on file  . Number of children: Not on file  . Years of education: Not on file  . Highest education level: Not on file  Occupational History  . Not on file  Tobacco Use  . Smoking status: Current Every Day Smoker    Packs/day: 0.50    Types: Cigarettes  . Smokeless tobacco: Never Used  Substance and Sexual Activity  . Alcohol use: Yes    Alcohol/week: 7.0 standard drinks    Types: 7 Standard drinks or equivalent per week  . Drug use: Never  . Sexual activity: Not on file  Other Topics Concern  . Not on file  Social History Narrative  . Not on file   Social Determinants of Health   Financial Resource Strain: Not on file  Food Insecurity: Not on file  Transportation Needs: Not on file  Physical Activity: Not on file  Stress: Not on file  Social Connections: Not on file  Intimate Partner Violence: Not on file    Family History:   History reviewed. No pertinent family history.  She has FHx of heart disease / early cardiac death, including a father and 2 brothers deceased from cardiac disease at ages 54, 60, and 61 years of age.  ROS:  Please see the history of present illness.  Review of Systems  Constitutional: Negative for chills,  diaphoresis and fever.  Respiratory: Positive for shortness of breath. Negative for hemoptysis.   Cardiovascular: Positive for chest pain. Negative for palpitations, orthopnea and leg swelling.       Tender to palpation along right chest area and tender when laying flat -notes a pulling in her chest when trying to lay her flat.  Gastrointestinal: Positive for melena. Negative for abdominal pain, blood in stool, constipation, diarrhea, heartburn, nausea and vomiting.  Neurological: Positive for dizziness and loss of consciousness.  Psychiatric/Behavioral: Nervous/anxious:    All other systems reviewed and are negative.   All other ROS reviewed and negative.     Physical Exam/Data:   Vitals:   02/15/21 0500 02/15/21 0630 02/15/21 0800 02/15/21 1100  BP: 94/68 117/71 97/69 128/87  Pulse: 71 (!) 47 (!) 42 79  Resp: '19 18  18  ' Temp:      SpO2: 99% 94% 100% 100%  Weight:      Height:        Intake/Output Summary (Last 24 hours) at 02/15/2021 1207 Last data filed at 02/15/2021 1059 Gross per 24 hour  Intake 584.89 ml  Output -  Net 584.89 ml   Last 3 Weights 02/14/2021 10/06/2020 08/12/2015  Weight (lbs) 150 lb 150 lb 188 lb 9.7 oz  Weight (kg) 68.04 kg 68.04 kg 85.55 kg     Body mass index is 23.49 kg/m.  General:  Well nourished, well developed, in no acute distress HEENT: normal Lymph: no adenopathy Neck: no JVD Endocrine:  No thryomegaly Vascular: No carotid bruits; FA pulses 2+ bilaterally without bruits  Cardiac:  normal S1, S2; RRR; no murmur - CP TTP on R side and pain when laying flat to assess for orthopnea Lungs:  Anterior auscultation only due to positional CP, coarse breath sounds bilaterally, adventitious breath sounds Abd: soft, nontender, no hepatomegaly  Ext: no edema Musculoskeletal:  No deformities, BUE and BLE strength normal and equal Skin: warm and dry  Neuro:  CNs 2-12 intact, no focal abnormalities noted Psych:  Normal affect   EKG:  The EKG was  personally reviewed and demonstrates:  atrial fibrillation with ventricular rate  84 bpm and nonspecific ST/T changes with poor R wave progression V1 to V2.  Telemetry:  Telemetry was personally reviewed and demonstrates: Currently NSR but EKG showed Afib  Relevant CV Studies: Pending echo  Laboratory Data:  High Sensitivity Troponin:   Recent Labs  Lab 02/14/21 2253 02/15/21 0045  TROPONINIHS 17 17     Chemistry Recent Labs  Lab 02/14/21 2253 02/15/21 0522  NA 137 137  K 2.4* 3.2*  CL 83* 88*  CO2 39* 36*  GLUCOSE 126* 136*  BUN 19 17  CREATININE 1.35* 1.23*  CALCIUM 6.4* 6.5*  GFRNONAA 45* 50*  ANIONGAP 15 13    Recent Labs  Lab 02/14/21 2253 02/15/21 0522  PROT 6.4* 6.0*  ALBUMIN 3.3* 3.1*  AST 269* 192*  ALT 50* 43  ALKPHOS 148* 156*  BILITOT 1.2 1.1   Hematology Recent Labs  Lab 02/14/21 2253 02/15/21 0522  WBC 4.1 3.8*  RBC 3.03* 2.80*  HGB 10.8* 10.2*  HCT 31.7* 29.7*  MCV 104.6* 106.1*  MCH 35.6* 36.4*  MCHC 34.1 34.3  RDW 15.2 15.5  PLT 236 218   BNPNo results for input(s): BNP, PROBNP in the last 168 hours.  DDimer No results for input(s): DDIMER in the last 168 hours.   Radiology/Studies:  DG Chest 2 View  Result Date: 02/14/2021 CLINICAL DATA:  Chest pain.  Syncopal episode. EXAM: CHEST - 2 VIEW COMPARISON:  Radiograph 02/10/2012 FINDINGS: The cardiomediastinal contours are normal. The lungs are clear. Pulmonary vasculature is normal. No consolidation, pleural effusion, or pneumothorax. No acute osseous abnormalities are seen. IMPRESSION: No acute chest findings. Electronically Signed   By: Keith Rake M.D.   On: 02/14/2021 23:31   CT Angio Chest PE W/Cm &/Or Wo Cm  Result Date: 02/15/2021 CLINICAL DATA:  Chest and abdominal pain EXAM: CT ANGIOGRAPHY CHEST CT ABDOMEN AND PELVIS WITH CONTRAST TECHNIQUE: Multidetector CT imaging of the chest was performed using the standard protocol during bolus administration of intravenous contrast.  Multiplanar CT image reconstructions and MIPs were obtained to evaluate the vascular anatomy. Multidetector CT imaging of the abdomen and pelvis was performed using the standard protocol during bolus administration of intravenous contrast. CONTRAST:  10m OMNIPAQUE IOHEXOL 350 MG/ML SOLN COMPARISON:  CT abdomen pelvis 02/19/2006 FINDINGS: CTA CHEST FINDINGS Cardiovascular: Contrast injection is sufficient to demonstrate satisfactory opacification of the pulmonary arteries to the segmental level. There is no pulmonary embolus. The main pulmonary artery is within normal limits for size. There is no CT evidence of acute right heart strain. There is calcific aortic atherosclerosis. There is a normal 3-vessel arch branching pattern. Heart size is normal, without pericardial effusion. Mediastinum/Nodes: No mediastinal, hilar or axillary lymphadenopathy. The visualized thyroid and thoracic esophageal course are unremarkable. Lungs/Pleura: 6 mm right middle lobe pulmonary nodule (series 6, image 36). 4 mm right lower lobe pulmonary nodule (series 6, image 18). No pleural effusion or pneumothorax. No focal airspace consolidation. No focal pleural abnormality. Musculoskeletal: No chest wall abnormality. No acute or significant osseous findings. Review of the MIP images confirms the above findings. CT ABDOMEN and PELVIS FINDINGS Hepatobiliary: Hepatic steatosis.  Gallbladder is unremarkable. Pancreas: Normal contours without ductal dilatation. No peripancreatic fluid collection. Spleen: Normal. Adrenals/Urinary Tract: --Adrenal glands: Normal. --Right kidney/ureter: No hydronephrosis or perinephric stranding. No nephrolithiasis. No obstructing ureteral stones. --Left kidney/ureter: No hydronephrosis or perinephric stranding. No nephrolithiasis. No obstructing ureteral stones. --Urinary bladder: Unremarkable. Stomach/Bowel: --Stomach/Duodenum: No hiatal hernia or other gastric abnormality. Normal duodenal course and caliber.  --Small  bowel: No dilatation or inflammation. --Colon: No focal abnormality. --Appendix: Normal. Vascular/Lymphatic: Atherosclerotic calcification is present within the non-aneurysmal abdominal aorta, without hemodynamically significant stenosis. No abdominal or pelvic lymphadenopathy. Reproductive: Status post hysterectomy. No adnexal mass. Musculoskeletal. No bony spinal canal stenosis or focal osseous abnormality. Other: None. IMPRESSION: 1. No pulmonary embolus or acute aortic syndrome. 2. No acute abnormality of the chest, abdomen or pelvis. 3. Hepatic steatosis. 4. Pulmonary nodules measuring up to 6 mm, unchanged compared to 02/19/2006 and therefore benign. Aortic Atherosclerosis (ICD10-I70.0). Electronically Signed   By: Ulyses Jarred M.D.   On: 02/15/2021 00:48   CT ABDOMEN PELVIS W CONTRAST  Result Date: 02/15/2021 CLINICAL DATA:  Chest and abdominal pain EXAM: CT ANGIOGRAPHY CHEST CT ABDOMEN AND PELVIS WITH CONTRAST TECHNIQUE: Multidetector CT imaging of the chest was performed using the standard protocol during bolus administration of intravenous contrast. Multiplanar CT image reconstructions and MIPs were obtained to evaluate the vascular anatomy. Multidetector CT imaging of the abdomen and pelvis was performed using the standard protocol during bolus administration of intravenous contrast. CONTRAST:  25m OMNIPAQUE IOHEXOL 350 MG/ML SOLN COMPARISON:  CT abdomen pelvis 02/19/2006 FINDINGS: CTA CHEST FINDINGS Cardiovascular: Contrast injection is sufficient to demonstrate satisfactory opacification of the pulmonary arteries to the segmental level. There is no pulmonary embolus. The main pulmonary artery is within normal limits for size. There is no CT evidence of acute right heart strain. There is calcific aortic atherosclerosis. There is a normal 3-vessel arch branching pattern. Heart size is normal, without pericardial effusion. Mediastinum/Nodes: No mediastinal, hilar or axillary lymphadenopathy.  The visualized thyroid and thoracic esophageal course are unremarkable. Lungs/Pleura: 6 mm right middle lobe pulmonary nodule (series 6, image 36). 4 mm right lower lobe pulmonary nodule (series 6, image 18). No pleural effusion or pneumothorax. No focal airspace consolidation. No focal pleural abnormality. Musculoskeletal: No chest wall abnormality. No acute or significant osseous findings. Review of the MIP images confirms the above findings. CT ABDOMEN and PELVIS FINDINGS Hepatobiliary: Hepatic steatosis.  Gallbladder is unremarkable. Pancreas: Normal contours without ductal dilatation. No peripancreatic fluid collection. Spleen: Normal. Adrenals/Urinary Tract: --Adrenal glands: Normal. --Right kidney/ureter: No hydronephrosis or perinephric stranding. No nephrolithiasis. No obstructing ureteral stones. --Left kidney/ureter: No hydronephrosis or perinephric stranding. No nephrolithiasis. No obstructing ureteral stones. --Urinary bladder: Unremarkable. Stomach/Bowel: --Stomach/Duodenum: No hiatal hernia or other gastric abnormality. Normal duodenal course and caliber. --Small bowel: No dilatation or inflammation. --Colon: No focal abnormality. --Appendix: Normal. Vascular/Lymphatic: Atherosclerotic calcification is present within the non-aneurysmal abdominal aorta, without hemodynamically significant stenosis. No abdominal or pelvic lymphadenopathy. Reproductive: Status post hysterectomy. No adnexal mass. Musculoskeletal. No bony spinal canal stenosis or focal osseous abnormality. Other: None. IMPRESSION: 1. No pulmonary embolus or acute aortic syndrome. 2. No acute abnormality of the chest, abdomen or pelvis. 3. Hepatic steatosis. 4. Pulmonary nodules measuring up to 6 mm, unchanged compared to 02/19/2006 and therefore benign. Aortic Atherosclerosis (ICD10-I70.0). Electronically Signed   By: KUlyses JarredM.D.   On: 02/15/2021 00:48   UKoreaAbdomen Limited RUQ (LIVER/GB)  Result Date: 02/15/2021 CLINICAL DATA:   Elevated liver function tests EXAM: ULTRASOUND ABDOMEN LIMITED RIGHT UPPER QUADRANT COMPARISON:  Abdominal CT from earlier today FINDINGS: Gallbladder: Gallbladder sludge.  No stone, wall thickening, or focal tenderness. Common bile duct: Diameter: 5 mm Liver: Diffusely echogenic liver. No focal lesion is seen. Portal vein is patent on color Doppler imaging with normal direction of blood flow towards the liver. IMPRESSION: 1. Gallbladder sludge.  No evidence of cholecystitis. 2. Hepatic  steatosis. Electronically Signed   By: Monte Fantasia M.D.   On: 02/15/2021 04:53     Assessment and Plan:   1. Syncope --Reports several days of atypical chest pain and melena in setting of EtOH use, followed by a day of dizziness and subsequent syncopal episode.  She does have a history of alcohol use and reports recent low oral intake, reflected in her lab work at presentation that includes mild AKI and electrolyte abnormalities.  EKG consistent with new onset and transient atrial fibrillation. Currently NSR on telemetry.  EKG without acute ST/T changes.  High-sensitivity troponin as below minimally elevated, flat trending.  Daily BMET.    Replete electrolytes: potassium goal 4.0, magnesium goal 2.0.    Daily CBC   Further work-up of anemia/melena per IM.  Obtain echo to assess EF, wall motion, valves, and heart structure.  Continue to monitor on telemetry.    Recommend ambulatory cardiac monitoring/Zio AT at discharge if no significant events captured on telemetry.   2. Atypical CPl with elevated Tn --Current CP atypical and most consistent with MSK etiology given it is positional, tender to palpation, sharp and brief, and at times pleuritic.  Denies any heavy lifting or injury. CP began before her syncopal event and TTP and with position changes on exam today. EKG without acute ST/T changes but did show atrial fibrillation with recommendations as below.  High-sensitivity troponin minimally elevated, flat  trending. Risk factors for cardiac etiology include age, tobacco use, hypertension, and significant family history; therefore, will get an echo. At this time, suspect troponin elevation due to supply demand ischemia in the setting of her AKI, electrolyte abnormalities, anemia, and rapid ventricular rate at presentation.  Obtain echo as above.  Continue to monitor on telemetry.  Daily BMET. Replete electrolyte abnormalities.  Daily CBC. Monitor anemia.  No indication for IV heparin at this time.    Caution with antiplatelet therapy given anemia / melena.  No plan for emergent cardiac catheterization.    Further recommendations if needed pending echo.   3. New onset paroxysmal atrial fibrillation  --Transient atrial fibrillation in the setting of electrolyte abnormalities with report of current alcohol use and low oral intake.  Currently NSR on telemetry.  Consider atrial fibrillation as 2/2 electrolyte abnormalities with significant hypokalemia and hypomagnesia at presentation.  Thyroid labs WNL.  Currently NSR, rate well controlled.   Discontinued amlodipine and losartan earlier this morning due to hypotension. If room in BP once these antihypertensives have washed out, could consider initiation of low-dose beta-blocker at that time.  Will defer for now.  CHA2DS2VASc score of at least 2 at this time (HTN, female) with recommendation for anticoagulation.  However, given her melena and alcohol use, will defer from initiation of anticoagulation at this time until can ensure no GI bleed 2/2 alcohol at this time.  Consider FOBT per IM.  4. Electrolyte abnormalities  Potassium goal 4.0.  Magnesium goal 2.0.  Replete as above.  5. Hypotension  Discontinued amlodipine and losartan due to hypotension earlier this morning.  Subsequent BP shows improvement since discontinued antihypertensives.  6. Anemia / reported melena  No baseline hemoglobin.  Reports melena x1 week.  Reports melena  began before Pepto-Bismol.    Given her soda and alcohol use, consider initiation of PPI per IM.  Caution with antiplatelets/anticoagulation.  Recommend FOBT per IM.  7. EtOH use  Currently drinks approximately 2 of 12 ounce cups of one half liquor and one half soda per day.  Recommend  cessation.  8. Tobacco use  Recommend cessation.  Pulmonary nodule seen on imaging and benign per report.  Continue to follow per PCP/IM.  [id number]}   TIMI Risk Score for Unstable Angina or Non-ST Elevation MI:   The patient's TIMI risk score is 2, which indicates a 8% risk of all cause mortality, new or recurrent myocardial infarction or need for urgent revascularization in the next 14 days.{      CHA2DS2-VASc Score = 2  This indicates a 2.2% annual risk of stroke. The patient's score is based upon: CHF History: No HTN History: Yes Diabetes History: No Stroke History: No Vascular Disease History: No Age Score: 0 Gender Score: 1          For questions or updates, please contact [place] Please consult [email or web address] for contact info under    Signed, Arvil Chaco, PA-C  02/15/2021 12:07 PM

## 2021-02-15 NOTE — ED Notes (Signed)
Patient assisted to bathroom at this time. Able to ambulate independently.  Patient c/o intermittent sharp, shooting CP with activity. Patient complaints of no pain when resting.

## 2021-02-16 ENCOUNTER — Inpatient Hospital Stay (HOSPITAL_COMMUNITY)
Admit: 2021-02-16 | Discharge: 2021-02-16 | Disposition: A | Discharge status: Home / Self Care | Payer: No Typology Code available for payment source | Attending: Physician Assistant | Admitting: Physician Assistant

## 2021-02-16 DIAGNOSIS — R55 Syncope and collapse: Secondary | ICD-10-CM | POA: Diagnosis not present

## 2021-02-16 DIAGNOSIS — R748 Abnormal levels of other serum enzymes: Secondary | ICD-10-CM

## 2021-02-16 DIAGNOSIS — R079 Chest pain, unspecified: Secondary | ICD-10-CM

## 2021-02-16 DIAGNOSIS — R7401 Elevation of levels of liver transaminase levels: Secondary | ICD-10-CM | POA: Diagnosis not present

## 2021-02-16 DIAGNOSIS — K7010 Alcoholic hepatitis without ascites: Secondary | ICD-10-CM | POA: Diagnosis not present

## 2021-02-16 DIAGNOSIS — E878 Other disorders of electrolyte and fluid balance, not elsewhere classified: Secondary | ICD-10-CM

## 2021-02-16 LAB — HEPATIC FUNCTION PANEL
ALT: 43 U/L (ref 0–44)
AST: 183 U/L — ABNORMAL HIGH (ref 15–41)
Albumin: 3.1 g/dL — ABNORMAL LOW (ref 3.5–5.0)
Alkaline Phosphatase: 147 U/L — ABNORMAL HIGH (ref 38–126)
Bilirubin, Direct: 0.3 mg/dL — ABNORMAL HIGH (ref 0.0–0.2)
Indirect Bilirubin: 0.7 mg/dL (ref 0.3–0.9)
Total Bilirubin: 1 mg/dL (ref 0.3–1.2)
Total Protein: 5.8 g/dL — ABNORMAL LOW (ref 6.5–8.1)

## 2021-02-16 LAB — PHOSPHORUS: Phosphorus: <1 mg/dL — CL (ref 2.5–4.6)

## 2021-02-16 LAB — BASIC METABOLIC PANEL
Anion gap: 9 (ref 5–15)
BUN: 12 mg/dL (ref 6–20)
CO2: 32 mmol/L (ref 22–32)
Calcium: 6.5 mg/dL — ABNORMAL LOW (ref 8.9–10.3)
Chloride: 97 mmol/L — ABNORMAL LOW (ref 98–111)
Creatinine, Ser: 0.97 mg/dL (ref 0.44–1.00)
GFR, Estimated: >60 mL/min (ref >60)
Glucose, Bld: 63 mg/dL — ABNORMAL LOW (ref 70–99)
Potassium: 4.5 mmol/L (ref 3.5–5.1)
Sodium: 138 mmol/L (ref 135–145)

## 2021-02-16 LAB — MAGNESIUM: Magnesium: 1.4 mg/dL — ABNORMAL LOW (ref 1.7–2.4)

## 2021-02-16 MED ORDER — ENSURE ENLIVE PO LIQD: 237.0000 mL | Freq: Two times a day (BID) | ORAL | Status: DC

## 2021-02-16 MED ORDER — CALCIUM GLUCONATE-NACL 2-0.675 GM/100ML-% IV SOLN
2.0000 g | Freq: Once | INTRAVENOUS | Status: AC
  Administered 2021-02-16: 2000 mg via INTRAVENOUS
  Filled 2021-02-16: qty 100

## 2021-02-16 MED ORDER — MAGNESIUM SULFATE 4 GM/100ML IV SOLN
4.0000 g | Freq: Once | INTRAVENOUS | Status: AC
  Administered 2021-02-16: 4 g via INTRAVENOUS
  Filled 2021-02-16: qty 100

## 2021-02-16 MED ORDER — K PHOS MONO-SOD PHOS DI & MONO 155-852-130 MG PO TABS
500.0000 mg | ORAL_TABLET | Freq: Three times a day (TID) | ORAL | Status: DC
  Administered 2021-02-16 (×3): 500 mg via ORAL
  Filled 2021-02-16 (×4): qty 2

## 2021-02-16 MED ORDER — POTASSIUM PHOSPHATES 15 MMOLE/5ML IV SOLN
30.0000 mmol | Freq: Once | INTRAVENOUS | Status: DC
  Filled 2021-02-16: qty 10

## 2021-02-16 MED ORDER — ADULT MULTIVITAMIN W/MINERALS CH: 1.0000 | ORAL_TABLET | Freq: Every day | ORAL | Status: DC

## 2021-02-16 MED ORDER — SODIUM CHLORIDE 0.9 % IV SOLN: INTRAVENOUS | Status: DC

## 2021-02-16 MED ORDER — POTASSIUM PHOSPHATES 15 MMOLE/5ML IV SOLN
30.0000 mmol | Freq: Once | INTRAVENOUS | Status: AC
  Administered 2021-02-16: 30 mmol via INTRAVENOUS
  Filled 2021-02-16: qty 10

## 2021-02-16 MED ORDER — MAGNESIUM OXIDE 400 (241.3 MG) MG PO TABS
400.0000 mg | ORAL_TABLET | Freq: Two times a day (BID) | ORAL | Status: DC
  Administered 2021-02-16 (×2): 400 mg via ORAL
  Filled 2021-02-16 (×2): qty 1

## 2021-02-16 NOTE — Progress Notes (Signed)
PHARMACY CONSULT NOTE - FOLLOW UP  Pharmacy Consult for Electrolyte Monitoring and Replacement   Recent Labs: Potassium (mmol/L)  Date Value  02/16/2021 4.5  05/03/2012 3.6   Magnesium (mg/dL)  Date Value  68/06/8109 1.4 (L)   Calcium (mg/dL)  Date Value  31/59/4585 6.5 (L)   Calcium, Total (mg/dL)  Date Value  92/92/4462 8.6   Albumin (g/dL)  Date Value  86/38/1771 3.1 (L)  05/03/2012 3.6   Phosphorus (mg/dL)  Date Value  16/57/9038 <1.0 (LL)   Sodium (mmol/L)  Date Value  02/16/2021 138  05/03/2012 140   Corrected Ca: 7.2   Assessment: 60 year old female admitted with chest pain. Electrolytes low on admission. Pharmacy consult for electrolyte replacement.  Fluids: on Na @100  mL/hr.   Goal of Therapy:  Electrolytes WNL  Plan:  Mag 4 g IV x 1. Medical team orderd K+ Phos IV 30 mmol x 1. On Calcium-VitD suppl, but will give Ca gluconate 2 g IV x 1 in the PM. .  ,PharmD Clinical Pharmacist 02/16/2021 7:41 AM

## 2021-02-16 NOTE — Progress Notes (Signed)
Progress Note  Patient Name: Jamie Wilkins Date of Encounter: 02/16/2021  Primary Cardiologist: New, Dr. Kirke Corin rounding  Subjective   No CP. Breathing well. Denies tachypalpitations. Eager to go home once able.  Echo still pending.   Inpatient Medications    Scheduled Meds: . aspirin EC  81 mg Oral Daily  . atorvastatin  40 mg Oral Daily  . budesonide (PULMICORT) nebulizer solution  0.25 mg Nebulization BID  . buPROPion  300 mg Oral Daily  . calcium-vitamin D  1 tablet Oral BID  . enoxaparin (LOVENOX) injection  40 mg Subcutaneous Q24H  . fluticasone  2 spray Each Nare Daily  . magnesium oxide  400 mg Oral BID  . montelukast  10 mg Oral QHS  . pantoprazole  40 mg Oral Daily  . phosphorus  500 mg Oral TID   Continuous Infusions: . calcium gluconate    . magnesium sulfate bolus IVPB 4 g (02/16/21 0904)  . potassium PHOSPHATE IVPB (in mmol)     PRN Meds: acetaminophen **OR** acetaminophen, albuterol, magnesium hydroxide, ondansetron **OR** ondansetron (ZOFRAN) IV, traZODone   Vital Signs    Vitals:   02/15/21 2111 02/16/21 0407 02/16/21 0802 02/16/21 0855  BP: 122/85 120/86  96/63  Pulse: 73 74  82  Resp: 17 18  14   Temp: 98 F (36.7 C) 97.9 F (36.6 C)  98.6 F (37 C)  TempSrc:  Oral  Oral  SpO2: 100% 100% 98% 100%  Weight:      Height:        Intake/Output Summary (Last 24 hours) at 02/16/2021 1007 Last data filed at 02/15/2021 1847 Gross per 24 hour  Intake 979.45 ml  Output 100 ml  Net 879.45 ml   Last 3 Weights 02/14/2021 10/06/2020 08/12/2015  Weight (lbs) 150 lb 150 lb 188 lb 9.7 oz  Weight (kg) 68.04 kg 68.04 kg 85.55 kg      Telemetry    NSR - Personally Reviewed  ECG    No new tracings since yesterday - Personally Reviewed  Physical Exam   GEN: No acute distress.   Neck: No JVD Cardiac: RRR, no murmurs, rubs, or gallops.  Respiratory: Clear to auscultation bilaterally. GI: Soft, nontender, non-distended  MS: No edema; No  deformity. Neuro:  Nonfocal  Psych: Normal affect   Labs    High Sensitivity Troponin:   Recent Labs  Lab 02/14/21 2253 02/15/21 0045  TROPONINIHS 17 17      Chemistry Recent Labs  Lab 02/14/21 2253 02/15/21 0522 02/16/21 0416  NA 137 137 138  K 2.4* 3.2* 4.5  CL 83* 88* 97*  CO2 39* 36* 32  GLUCOSE 126* 136* 63*  BUN 19 17 12   CREATININE 1.35* 1.23* 0.97  CALCIUM 6.4* 6.5* 6.5*  PROT 6.4* 6.0* 5.8*  ALBUMIN 3.3* 3.1* 3.1*  AST 269* 192* 183*  ALT 50* 43 43  ALKPHOS 148* 156* 147*  BILITOT 1.2 1.1 1.0  GFRNONAA 45* 50* >60  ANIONGAP 15 13 9      Hematology Recent Labs  Lab 02/14/21 2253 02/15/21 0522  WBC 4.1 3.8*  RBC 3.03* 2.80*  HGB 10.8* 10.2*  HCT 31.7* 29.7*  MCV 104.6* 106.1*  MCH 35.6* 36.4*  MCHC 34.1 34.3  RDW 15.2 15.5  PLT 236 218    BNPNo results for input(s): BNP, PROBNP in the last 168 hours.   DDimer No results for input(s): DDIMER in the last 168 hours.   Radiology    DG Chest 2  View  Result Date: 02/14/2021 CLINICAL DATA:  Chest pain.  Syncopal episode. EXAM: CHEST - 2 VIEW COMPARISON:  Radiograph 02/10/2012 FINDINGS: The cardiomediastinal contours are normal. The lungs are clear. Pulmonary vasculature is normal. No consolidation, pleural effusion, or pneumothorax. No acute osseous abnormalities are seen. IMPRESSION: No acute chest findings. Electronically Signed   By: Narda Rutherford M.D.   On: 02/14/2021 23:31   CT Angio Chest PE W/Cm &/Or Wo Cm  Result Date: 02/15/2021 CLINICAL DATA:  Chest and abdominal pain EXAM: CT ANGIOGRAPHY CHEST CT ABDOMEN AND PELVIS WITH CONTRAST TECHNIQUE: Multidetector CT imaging of the chest was performed using the standard protocol during bolus administration of intravenous contrast. Multiplanar CT image reconstructions and MIPs were obtained to evaluate the vascular anatomy. Multidetector CT imaging of the abdomen and pelvis was performed using the standard protocol during bolus administration of  intravenous contrast. CONTRAST:  36mL OMNIPAQUE IOHEXOL 350 MG/ML SOLN COMPARISON:  CT abdomen pelvis 02/19/2006 FINDINGS: CTA CHEST FINDINGS Cardiovascular: Contrast injection is sufficient to demonstrate satisfactory opacification of the pulmonary arteries to the segmental level. There is no pulmonary embolus. The main pulmonary artery is within normal limits for size. There is no CT evidence of acute right heart strain. There is calcific aortic atherosclerosis. There is a normal 3-vessel arch branching pattern. Heart size is normal, without pericardial effusion. Mediastinum/Nodes: No mediastinal, hilar or axillary lymphadenopathy. The visualized thyroid and thoracic esophageal course are unremarkable. Lungs/Pleura: 6 mm right middle lobe pulmonary nodule (series 6, image 36). 4 mm right lower lobe pulmonary nodule (series 6, image 18). No pleural effusion or pneumothorax. No focal airspace consolidation. No focal pleural abnormality. Musculoskeletal: No chest wall abnormality. No acute or significant osseous findings. Review of the MIP images confirms the above findings. CT ABDOMEN and PELVIS FINDINGS Hepatobiliary: Hepatic steatosis.  Gallbladder is unremarkable. Pancreas: Normal contours without ductal dilatation. No peripancreatic fluid collection. Spleen: Normal. Adrenals/Urinary Tract: --Adrenal glands: Normal. --Right kidney/ureter: No hydronephrosis or perinephric stranding. No nephrolithiasis. No obstructing ureteral stones. --Left kidney/ureter: No hydronephrosis or perinephric stranding. No nephrolithiasis. No obstructing ureteral stones. --Urinary bladder: Unremarkable. Stomach/Bowel: --Stomach/Duodenum: No hiatal hernia or other gastric abnormality. Normal duodenal course and caliber. --Small bowel: No dilatation or inflammation. --Colon: No focal abnormality. --Appendix: Normal. Vascular/Lymphatic: Atherosclerotic calcification is present within the non-aneurysmal abdominal aorta, without  hemodynamically significant stenosis. No abdominal or pelvic lymphadenopathy. Reproductive: Status post hysterectomy. No adnexal mass. Musculoskeletal. No bony spinal canal stenosis or focal osseous abnormality. Other: None. IMPRESSION: 1. No pulmonary embolus or acute aortic syndrome. 2. No acute abnormality of the chest, abdomen or pelvis. 3. Hepatic steatosis. 4. Pulmonary nodules measuring up to 6 mm, unchanged compared to 02/19/2006 and therefore benign. Aortic Atherosclerosis (ICD10-I70.0). Electronically Signed   By: Deatra Robinson M.D.   On: 02/15/2021 00:48   CT ABDOMEN PELVIS W CONTRAST  Result Date: 02/15/2021 CLINICAL DATA:  Chest and abdominal pain EXAM: CT ANGIOGRAPHY CHEST CT ABDOMEN AND PELVIS WITH CONTRAST TECHNIQUE: Multidetector CT imaging of the chest was performed using the standard protocol during bolus administration of intravenous contrast. Multiplanar CT image reconstructions and MIPs were obtained to evaluate the vascular anatomy. Multidetector CT imaging of the abdomen and pelvis was performed using the standard protocol during bolus administration of intravenous contrast. CONTRAST:  40mL OMNIPAQUE IOHEXOL 350 MG/ML SOLN COMPARISON:  CT abdomen pelvis 02/19/2006 FINDINGS: CTA CHEST FINDINGS Cardiovascular: Contrast injection is sufficient to demonstrate satisfactory opacification of the pulmonary arteries to the segmental level. There is no pulmonary embolus.  The main pulmonary artery is within normal limits for size. There is no CT evidence of acute right heart strain. There is calcific aortic atherosclerosis. There is a normal 3-vessel arch branching pattern. Heart size is normal, without pericardial effusion. Mediastinum/Nodes: No mediastinal, hilar or axillary lymphadenopathy. The visualized thyroid and thoracic esophageal course are unremarkable. Lungs/Pleura: 6 mm right middle lobe pulmonary nodule (series 6, image 36). 4 mm right lower lobe pulmonary nodule (series 6, image 18).  No pleural effusion or pneumothorax. No focal airspace consolidation. No focal pleural abnormality. Musculoskeletal: No chest wall abnormality. No acute or significant osseous findings. Review of the MIP images confirms the above findings. CT ABDOMEN and PELVIS FINDINGS Hepatobiliary: Hepatic steatosis.  Gallbladder is unremarkable. Pancreas: Normal contours without ductal dilatation. No peripancreatic fluid collection. Spleen: Normal. Adrenals/Urinary Tract: --Adrenal glands: Normal. --Right kidney/ureter: No hydronephrosis or perinephric stranding. No nephrolithiasis. No obstructing ureteral stones. --Left kidney/ureter: No hydronephrosis or perinephric stranding. No nephrolithiasis. No obstructing ureteral stones. --Urinary bladder: Unremarkable. Stomach/Bowel: --Stomach/Duodenum: No hiatal hernia or other gastric abnormality. Normal duodenal course and caliber. --Small bowel: No dilatation or inflammation. --Colon: No focal abnormality. --Appendix: Normal. Vascular/Lymphatic: Atherosclerotic calcification is present within the non-aneurysmal abdominal aorta, without hemodynamically significant stenosis. No abdominal or pelvic lymphadenopathy. Reproductive: Status post hysterectomy. No adnexal mass. Musculoskeletal. No bony spinal canal stenosis or focal osseous abnormality. Other: None. IMPRESSION: 1. No pulmonary embolus or acute aortic syndrome. 2. No acute abnormality of the chest, abdomen or pelvis. 3. Hepatic steatosis. 4. Pulmonary nodules measuring up to 6 mm, unchanged compared to 02/19/2006 and therefore benign. Aortic Atherosclerosis (ICD10-I70.0). Electronically Signed   By: Deatra RobinsonKevin  Herman M.D.   On: 02/15/2021 00:48   US Abdomen Limited RUQ (LIVER/GB)  Result Date: 02/15/2021 CLINICAL DATA:  Elevated liver function tests EXAM: ULTRASOUND ABDOMEN LIMITED RIGHT UPPER QUADRANT COMPARISON:  Abdominal CT from earlier today FINDINGS: Gallbladder: Gallbladder sludge.  No stone, wall thickening, or focal  tenderness. Common bile duct: Diameter: 5 mm Liver: Diffusely echogenic liver. No focal lesion is seen. Portal vein is patent on color Doppler imaging with normal direction of blood flow towards the liver. IMPRESSION: 1. Gallbladder sludge.  No evidence of cholecystitis. 2. Hepatic steatosis. Electronically Signed   By: Marnee SpringJonathon  Watts M.D.   On: 02/15/2021 04:53    Cardiac Studies   Echo still pending  Patient Profile     60 y.o. female with a hx of current tobacco and EtOH use, HTN, and asthma,  Assessment & Plan    1. Syncope --Suspected as 2/2 dehydration and electrolyte abnormalities with daily EtOH use. Reports several days of atypical chest pain then a day of dizziness with syncopal episode x1. EKG consistent with new onset and transient atrial fibrillation. Currently NSR on telemetry.  Recommend obtain echo to assess EF, wall motion, valves, and heart structure. If unable to obtain an echo before discharge, recommend OP echo.   2. Atypical CPl with elevated Tn --No current chest pain. CP atypical and thought most consistent with MSK etiology given positional, tender to palpation, sharp and brief, and at times pleuritic.  Denies any heavy lifting or injury. EKG without acute ST/T changes but did show atrial fibrillation with recommendations as below.  High-sensitivity troponin minimally elevated, flat trending. Does have RF for cardiac etiology but with low suspicion for cor insufficiency given atypical CP and presentation. Suspect Tn elevated 2/2 supply demand ischemia in the setting of her AKI, electrolyte abnormalities, anemia, and rapid ventricular rate at presentation.  Obtain  echo as above-if unable to obtain before discharge, will schedule in the outpatient setting. No plan for emergent cardiac catheterization or invasive ischemic work-up at this time.    3. New onset paroxysmal atrial fibrillation  --Transient atrial fibrillation in the setting of electrolyte abnormalities with  report of current alcohol use and low oral intake.  Currently NSR on telemetry.  Consider atrial fibrillation as 2/2 electrolyte abnormalities with significant hypokalemia and hypomagnesia at presentation.  Thyroid labs WNL.  Currently NSR, rate well controlled.   CHA2DS2VASc score of at least 2 at this time (HTN, female) with OAC deferred at this time given Hgb and alcohol use with risk of bleeding.    4. Electrolyte abnormalities  Potassium goal 4.0.  Magnesium goal 2.0.  Replete as above.  Repleted magnesium and phosphorus.   6. Anemia / reported melena  No baseline hemoglobin.  Reports melena x1 week.  Reports melena began before Pepto-Bismol.  Given her soda and alcohol use, consider initiation of PPI per IM. Caution with antiplatelets/anticoagulation.  7. EtOH use  Currently drinks approximately 2 of 12 ounce cups of one half liquor and one half soda per day.  Recommend cessation.  8. Tobacco use  Recommend cessation.  Pulmonary nodule seen on imaging and benign per report.  Continue to follow per PCP/IM.  [id number]}   TIMI Risk Score for Unstable Angina or Non-ST Elevation MI:   The patient's TIMI risk score is 2, which indicates a 8% risk of all cause mortality, new or recurrent myocardial infarction or need for urgent revascularization in the next 14 days.{      CHA2DS2-VASc Score = 2  This indicates a 2.2% annual risk of stroke. The patient's score is based upon: CHF History: No HTN History: Yes Diabetes History: No Stroke History: No Vascular Disease History: No Age Score: 0 Gender Score: 1  For questions or updates, please contact [facility] Please consult [email or web address] for contact info under        Signed, Lennon Alstrom, PA-C  02/16/2021, 10:07 AM

## 2021-02-16 NOTE — Progress Notes (Signed)
[facility]  - [place] at [facility]   PATIENT NAME: Jamie Wilkins    MR#:  [id number]  DATE OF BIRTH:  1961-09-23  SUBJECTIVE:  Feels better No cp or sob Wants to go home  REVIEW OF SYSTEMS:   Review of Systems  Constitutional: Positive for malaise/fatigue. Negative for chills, fever and weight loss.  HENT: Negative for ear discharge, ear pain and nosebleeds.   Eyes: Negative for blurred vision, pain and discharge.  Respiratory: Negative for sputum production, shortness of breath, wheezing and stridor.   Cardiovascular: Positive for chest pain. Negative for palpitations, orthopnea and PND.  Gastrointestinal: Negative for abdominal pain, diarrhea, nausea and vomiting.  Genitourinary: Negative for frequency and urgency.  Musculoskeletal: Negative for back pain and joint pain.  Neurological: Positive for weakness. Negative for sensory change, speech change and focal weakness.  Psychiatric/Behavioral: Negative for depression and hallucinations. The patient is not nervous/anxious.    Tolerating Diet:yes Tolerating PT:   DRUG ALLERGIES:  No Known Allergies  VITALS:  Blood pressure 96/63, pulse 82, temperature 98.6 F (37 C), temperature source Oral, resp. rate 14, height 5\' 7"  (1.702 m), weight 65 kg, last menstrual period 05/11/2010, SpO2 100 %.  PHYSICAL EXAMINATION:   Physical Exam  GENERAL:  60 y.o.-year-old patient lying in the bed with no acute distress.  LUNGS: Normal breath sounds bilaterally, no wheezing, rales, rhonchi. No use of accessory muscles of respiration. Right costochondral pain on palalption CARDIOVASCULAR: S1, S2 normal. No murmurs, rubs, or gallops.  ABDOMEN: Soft, nontender, nondistended. Bowel sounds present. No organomegaly or mass.  EXTREMITIES: No cyanosis, clubbing or edema b/l.    NEUROLOGIC:non focal PSYCHIATRIC:  patient is alert and oriented x 3.  SKIN: No obvious rash, lesion, or ulcer.   LABORATORY PANEL:  CBC Recent  Labs  Lab 02/15/21 0522  WBC 3.8*  HGB 10.2*  HCT 29.7*  PLT 218    Chemistries  Recent Labs  Lab 02/16/21 0416  NA 138  K 4.5  CL 97*  CO2 32  GLUCOSE 63*  BUN 12  CREATININE 0.97  CALCIUM 6.5*  MG 1.4*  AST 183*  ALT 43  ALKPHOS 147*  BILITOT 1.0   Cardiac Enzymes No results for input(s): TROPONINI in the last 168 hours. RADIOLOGY:  DG Chest 2 View  Result Date: 02/14/2021 CLINICAL DATA:  Chest pain.  Syncopal episode. EXAM: CHEST - 2 VIEW COMPARISON:  Radiograph 02/10/2012 FINDINGS: The cardiomediastinal contours are normal. The lungs are clear. Pulmonary vasculature is normal. No consolidation, pleural effusion, or pneumothorax. No acute osseous abnormalities are seen. IMPRESSION: No acute chest findings. Electronically Signed   By: 02/12/2012 M.D.   On: 02/14/2021 23:31   CT Angio Chest PE W/Cm &/Or Wo Cm  Result Date: 02/15/2021 CLINICAL DATA:  Chest and abdominal pain EXAM: CT ANGIOGRAPHY CHEST CT ABDOMEN AND PELVIS WITH CONTRAST TECHNIQUE: Multidetector CT imaging of the chest was performed using the standard protocol during bolus administration of intravenous contrast. Multiplanar CT image reconstructions and MIPs were obtained to evaluate the vascular anatomy. Multidetector CT imaging of the abdomen and pelvis was performed using the standard protocol during bolus administration of intravenous contrast. CONTRAST:  48mL OMNIPAQUE IOHEXOL 350 MG/ML SOLN COMPARISON:  CT abdomen pelvis 02/19/2006 FINDINGS: CTA CHEST FINDINGS Cardiovascular: Contrast injection is sufficient to demonstrate satisfactory opacification of the pulmonary arteries to the segmental level. There is no pulmonary embolus. The main pulmonary artery is within normal limits for size. There is no CT evidence  of acute right heart strain. There is calcific aortic atherosclerosis. There is a normal 3-vessel arch branching pattern. Heart size is normal, without pericardial effusion. Mediastinum/Nodes: No  mediastinal, hilar or axillary lymphadenopathy. The visualized thyroid and thoracic esophageal course are unremarkable. Lungs/Pleura: 6 mm right middle lobe pulmonary nodule (series 6, image 36). 4 mm right lower lobe pulmonary nodule (series 6, image 18). No pleural effusion or pneumothorax. No focal airspace consolidation. No focal pleural abnormality. Musculoskeletal: No chest wall abnormality. No acute or significant osseous findings. Review of the MIP images confirms the above findings. CT ABDOMEN and PELVIS FINDINGS Hepatobiliary: Hepatic steatosis.  Gallbladder is unremarkable. Pancreas: Normal contours without ductal dilatation. No peripancreatic fluid collection. Spleen: Normal. Adrenals/Urinary Tract: --Adrenal glands: Normal. --Right kidney/ureter: No hydronephrosis or perinephric stranding. No nephrolithiasis. No obstructing ureteral stones. --Left kidney/ureter: No hydronephrosis or perinephric stranding. No nephrolithiasis. No obstructing ureteral stones. --Urinary bladder: Unremarkable. Stomach/Bowel: --Stomach/Duodenum: No hiatal hernia or other gastric abnormality. Normal duodenal course and caliber. --Small bowel: No dilatation or inflammation. --Colon: No focal abnormality. --Appendix: Normal. Vascular/Lymphatic: Atherosclerotic calcification is present within the non-aneurysmal abdominal aorta, without hemodynamically significant stenosis. No abdominal or pelvic lymphadenopathy. Reproductive: Status post hysterectomy. No adnexal mass. Musculoskeletal. No bony spinal canal stenosis or focal osseous abnormality. Other: None. IMPRESSION: 1. No pulmonary embolus or acute aortic syndrome. 2. No acute abnormality of the chest, abdomen or pelvis. 3. Hepatic steatosis. 4. Pulmonary nodules measuring up to 6 mm, unchanged compared to 02/19/2006 and therefore benign. Aortic Atherosclerosis (ICD10-I70.0). Electronically Signed   By: Deatra Robinson M.D.   On: 02/15/2021 00:48   CT ABDOMEN PELVIS W  CONTRAST  Result Date: 02/15/2021 CLINICAL DATA:  Chest and abdominal pain EXAM: CT ANGIOGRAPHY CHEST CT ABDOMEN AND PELVIS WITH CONTRAST TECHNIQUE: Multidetector CT imaging of the chest was performed using the standard protocol during bolus administration of intravenous contrast. Multiplanar CT image reconstructions and MIPs were obtained to evaluate the vascular anatomy. Multidetector CT imaging of the abdomen and pelvis was performed using the standard protocol during bolus administration of intravenous contrast. CONTRAST:  83mL OMNIPAQUE IOHEXOL 350 MG/ML SOLN COMPARISON:  CT abdomen pelvis 02/19/2006 FINDINGS: CTA CHEST FINDINGS Cardiovascular: Contrast injection is sufficient to demonstrate satisfactory opacification of the pulmonary arteries to the segmental level. There is no pulmonary embolus. The main pulmonary artery is within normal limits for size. There is no CT evidence of acute right heart strain. There is calcific aortic atherosclerosis. There is a normal 3-vessel arch branching pattern. Heart size is normal, without pericardial effusion. Mediastinum/Nodes: No mediastinal, hilar or axillary lymphadenopathy. The visualized thyroid and thoracic esophageal course are unremarkable. Lungs/Pleura: 6 mm right middle lobe pulmonary nodule (series 6, image 36). 4 mm right lower lobe pulmonary nodule (series 6, image 18). No pleural effusion or pneumothorax. No focal airspace consolidation. No focal pleural abnormality. Musculoskeletal: No chest wall abnormality. No acute or significant osseous findings. Review of the MIP images confirms the above findings. CT ABDOMEN and PELVIS FINDINGS Hepatobiliary: Hepatic steatosis.  Gallbladder is unremarkable. Pancreas: Normal contours without ductal dilatation. No peripancreatic fluid collection. Spleen: Normal. Adrenals/Urinary Tract: --Adrenal glands: Normal. --Right kidney/ureter: No hydronephrosis or perinephric stranding. No nephrolithiasis. No obstructing  ureteral stones. --Left kidney/ureter: No hydronephrosis or perinephric stranding. No nephrolithiasis. No obstructing ureteral stones. --Urinary bladder: Unremarkable. Stomach/Bowel: --Stomach/Duodenum: No hiatal hernia or other gastric abnormality. Normal duodenal course and caliber. --Small bowel: No dilatation or inflammation. --Colon: No focal abnormality. --Appendix: Normal. Vascular/Lymphatic: Atherosclerotic calcification is present within the non-aneurysmal  abdominal aorta, without hemodynamically significant stenosis. No abdominal or pelvic lymphadenopathy. Reproductive: Status post hysterectomy. No adnexal mass. Musculoskeletal. No bony spinal canal stenosis or focal osseous abnormality. Other: None. IMPRESSION: 1. No pulmonary embolus or acute aortic syndrome. 2. No acute abnormality of the chest, abdomen or pelvis. 3. Hepatic steatosis. 4. Pulmonary nodules measuring up to 6 mm, unchanged compared to 02/19/2006 and therefore benign. Aortic Atherosclerosis (ICD10-I70.0). Electronically Signed   By: Deatra Robinson M.D.   On: 02/15/2021 00:48   US Abdomen Limited RUQ (LIVER/GB)  Result Date: 02/15/2021 CLINICAL DATA:  Elevated liver function tests EXAM: ULTRASOUND ABDOMEN LIMITED RIGHT UPPER QUADRANT COMPARISON:  Abdominal CT from earlier today FINDINGS: Gallbladder: Gallbladder sludge.  No stone, wall thickening, or focal tenderness. Common bile duct: Diameter: 5 mm Liver: Diffusely echogenic liver. No focal lesion is seen. Portal vein is patent on color Doppler imaging with normal direction of blood flow towards the liver. IMPRESSION: 1. Gallbladder sludge.  No evidence of cholecystitis. 2. Hepatic steatosis. Electronically Signed   By: Marnee Spring M.D.   On: 02/15/2021 04:53   ASSESSMENT AND PLAN:  Jamie Wilkins is a 60 y.o. female with medical history significant for hypertension, asthma and tobacco abuse, presented to the emergency room the onset of central chest pain felt as pressure  and tightness and graded occasionally at 10/10 in severity with no nausea, vomiting or diaphoresis, palpitations or dyspnea.  Chest pain, with associated syncope, ruled out acute coronary syndrome-- negative cardiac enzyme and no acute EKG changes chest pain is atypical appears costochondritis given palpation -serial cardiac enzymes times two negative. [facility] consult and 2D echo --no further cardiac diagnostics recommended --asa po daily   alcoholic hepatitis andelevated lipase level that could be related to mild early pancreatitis not currently appearing on abdominal CT  -ultrasound abdomen shows hepatic steatosis. -- Patient advised to abstain from drinking alcohol -- LFTs trending down  Severe Electrolyte abnormality Hypomagnesemia,hypokalemia, hypophosphatemia, hypcalcemia - replace with p.o. and IV supplementation per Pharmacy  Atrial fibrillation with controlled ventricular response, apparently newly diagnosed. -2D echo and a cardiology consult to be obtained as mentioned above. -- Replace electrolytes --- patient now in sinus rhythm     DVT prophylaxis: Lovenox. Code Status: full code. Family Communication:  none Disposition Plan: Back to previous home environment Consults called: Cardiology consult. Status is: Inpatient  Remains inpatient appropriate because:low Phos and Mag Dispo: The patient is from: Home  Anticipated d/c is to: Home  Patient currently is not medically stable to d/c.              Difficult to place patient No  Level of care: Progressive Cardiac Status is: Inpatient  TOC for substance abuse Electrolyte replacement  D/c in am if remains stable        TOTAL TIME TAKING CARE OF THIS PATIENT: *25* minutes.  >50% time spent on counselling and coordination of care  Note: This dictation was prepared with Dragon dictation along with smaller phrase technology. Any transcriptional errors that result from  this process are unintentional.  Jamie Wilkins M.D    [facility]   CC: Primary care physician; Veneda Melter, FNPPatient ID: Jamie Wilkins, female   DOB: Mar 19, 1961, 60 y.o.   MRN: [id number]

## 2021-02-16 NOTE — Progress Notes (Signed)
Initial Nutrition Assessment  DOCUMENTATION CODES:   Non-severe (moderate) malnutrition in context of social or environmental circumstances  INTERVENTION:   Ensure Enlive po BID, each supplement provides 350 kcal and 20 grams of protein  MVI po daily   Recommend thiamine and folic acid po daily in setting of etoh use   Liberalize diet   Pt at high refeed risk; recommend monitor potassium, magnesium and phosphorus labs daily until stable  NUTRITION DIAGNOSIS:   Moderate Malnutrition related to social / environmental circumstances as evidenced by moderate fat depletion,moderate muscle depletion,severe muscle depletion.  GOAL:   Patient will meet greater than or equal to 90% of their needs  MONITOR:   PO intake,Supplement acceptance,Labs,TF tolerance,Skin,I & O's  REASON FOR ASSESSMENT:   Malnutrition Screening Tool    ASSESSMENT:   60 y.o. female with medical history significant for hypertension, asthma, etoh and tobacco abuse who presented to the emergency room the onset of central chest pain felt as pressure and tightness. Pt found to have new Afib and alcoholic hepatitis  Met with pt in room today. Pt reports fair appetite and oral intake at baseline. Pt reports that her appetite has been fair in hospital; pt ate only sips/bites of her breakfast but did eat ~60% of her lunch. Pt reports that she drinks Ensure and Boost at home. RD discussed with pt the importance of adequate nutrition needed to preserve lean muscle; pt would like to have Ensue in hospital. RD will add supplements and MVI to help pt meet her estimated needs. RD will also liberalize the heart healthy portion of pt's diet as this is restrictive of protein. Pt is currently refeeding. There is no documented weight history in chart to determine if any significant recent weight changes; pt reports that her weight is stable.   Medications reviewed and include: aspirin, oscal w/ D, lovenox, Mg oxide, MVI, protonix,  KPhos, Ca gluconate   Labs reviewed: K 4.5 wnl, P <1.0(L), Mg 1.4(L) Wbc- 3.8(L), Hgb 10.2(L), Hct 29.7(L), MCV 106.1(H), MCH 36.4(H)  NUTRITION - FOCUSED PHYSICAL EXAM:  Flowsheet Row Most Recent Value  Orbital Region Mild depletion  Upper Arm Region Moderate depletion  Thoracic and Lumbar Region Moderate depletion  Buccal Region Mild depletion  Temple Region Mild depletion  Clavicle Bone Region Mild depletion  Clavicle and Acromion Bone Region Mild depletion  Scapular Bone Region Mild depletion  Dorsal Hand Moderate depletion  Patellar Region Severe depletion  Anterior Thigh Region Severe depletion  Posterior Calf Region Severe depletion  Edema (RD Assessment) None  Hair Reviewed  Eyes Reviewed  Mouth Reviewed  Skin Reviewed  Nails Reviewed     Diet Order:   Diet Order            Diet Heart Room service appropriate? Yes; Fluid consistency: Thin  Diet effective now                EDUCATION NEEDS:   Education needs have been addressed  Skin:  Skin Assessment: Reviewed RN Assessment  Last BM:  3/28  Height:   Ht Readings from Last 1 Encounters:  02/14/21 $RemoveB'5\' 7"'HyYFIleG$  (1.702 m)    Weight:   Wt Readings from Last 1 Encounters:  02/16/21 65 kg    Ideal Body Weight:  61.36 kg  BMI:  Body mass index is 22.44 kg/m.  Estimated Nutritional Needs:   Kcal:  1700-1900kcal/day  Protein:  85-95g/day  Fluid:  1.8-2.1L/day  Koleen Distance MS, RD, LDN Please refer to [facility] for RD  and/or RD on-call/weekend/after hours pager

## 2021-02-17 DIAGNOSIS — R55 Syncope and collapse: Secondary | ICD-10-CM

## 2021-02-17 DIAGNOSIS — R7401 Elevation of levels of liver transaminase levels: Secondary | ICD-10-CM | POA: Diagnosis not present

## 2021-02-17 DIAGNOSIS — R079 Chest pain, unspecified: Secondary | ICD-10-CM | POA: Diagnosis not present

## 2021-02-17 DIAGNOSIS — E440 Moderate protein-calorie malnutrition: Secondary | ICD-10-CM | POA: Insufficient documentation

## 2021-02-17 DIAGNOSIS — R748 Abnormal levels of other serum enzymes: Secondary | ICD-10-CM | POA: Diagnosis not present

## 2021-02-17 LAB — BASIC METABOLIC PANEL
Anion gap: 11 (ref 5–15)
BUN: 9 mg/dL (ref 6–20)
CO2: 27 mmol/L (ref 22–32)
Calcium: 7.2 mg/dL — ABNORMAL LOW (ref 8.9–10.3)
Chloride: 97 mmol/L — ABNORMAL LOW (ref 98–111)
Creatinine, Ser: 1 mg/dL (ref 0.44–1.00)
GFR, Estimated: >60 mL/min (ref >60)
Glucose, Bld: 65 mg/dL — ABNORMAL LOW (ref 70–99)
Potassium: 5.5 mmol/L — ABNORMAL HIGH (ref 3.5–5.1)
Sodium: 135 mmol/L (ref 135–145)

## 2021-02-17 LAB — HEPATIC FUNCTION PANEL
ALT: 37 U/L (ref 0–44)
AST: 115 U/L — ABNORMAL HIGH (ref 15–41)
Albumin: 2.9 g/dL — ABNORMAL LOW (ref 3.5–5.0)
Alkaline Phosphatase: 135 U/L — ABNORMAL HIGH (ref 38–126)
Bilirubin, Direct: 0.3 mg/dL — ABNORMAL HIGH (ref 0.0–0.2)
Indirect Bilirubin: 0.7 mg/dL (ref 0.3–0.9)
Total Bilirubin: 1 mg/dL (ref 0.3–1.2)
Total Protein: 5.6 g/dL — ABNORMAL LOW (ref 6.5–8.1)

## 2021-02-17 LAB — ECHOCARDIOGRAM COMPLETE
Area-P 1/2: 3.6 cm2
Height: 67 in
S' Lateral: 2.81 cm
Weight: 2292.8 oz

## 2021-02-17 LAB — MAGNESIUM: Magnesium: 1.9 mg/dL (ref 1.7–2.4)

## 2021-02-17 LAB — PHOSPHORUS: Phosphorus: 4.8 mg/dL — ABNORMAL HIGH (ref 2.5–4.6)

## 2021-02-17 LAB — POTASSIUM: Potassium: 4.9 mmol/L (ref 3.5–5.1)

## 2021-02-17 MED ORDER — SODIUM ZIRCONIUM CYCLOSILICATE 10 G PO PACK
10.0000 g | PACK | ORAL | Status: DC
  Filled 2021-02-17: qty 1

## 2021-02-17 MED ORDER — SODIUM ZIRCONIUM CYCLOSILICATE 10 G PO PACK
10.0000 g | PACK | Freq: Once | ORAL | Status: DC
  Filled 2021-02-17: qty 1

## 2021-02-17 NOTE — Discharge Instructions (Signed)

## 2021-02-17 NOTE — Progress Notes (Signed)
PHARMACY CONSULT NOTE - FOLLOW UP  Pharmacy Consult for Electrolyte Monitoring and Replacement   Recent Labs: Potassium (mmol/L)  Date Value  02/17/2021 5.5 (H)  05/03/2012 3.6   Magnesium (mg/dL)  Date Value  47/34/0370 1.9   Calcium (mg/dL)  Date Value  96/43/8381 7.2 (L)   Calcium, Total (mg/dL)  Date Value  84/01/7542 8.6   Albumin (g/dL)  Date Value  60/67/7034 2.9 (L)  05/03/2012 3.6   Phosphorus (mg/dL)  Date Value  03/52/4818 4.8 (H)   Sodium (mmol/L)  Date Value  02/17/2021 135  05/03/2012 140   Corrected Ca: 8.1  Assessment: 60 year old female admitted with chest pain. Electrolytes low on admission. Pharmacy consult for electrolyte replacement.  Fluids: on Na @100  mL/hr.   Goal of Therapy:  Electrolytes WNL  Plan:   No replacement needed today. Will give one dose of Lokelma.  F/u with AM labs.   ,PharmD, BCPS Clinical Pharmacist 02/17/2021 7:57 AM

## 2021-02-17 NOTE — Progress Notes (Signed)
Jamie Wilkins to be D/C'd Home per MD order.  Discussed prescriptions and follow up appointments with the patient. No  Prescriptions.medication list explained in detail. Pt verbalized understanding.  Allergies as of 02/17/2021   No Known Allergies     Medication List    STOP taking these medications   Klor-Con M20 20 MEQ tablet Generic drug: potassium chloride SA   mupirocin ointment 2 % Commonly known as: BACTROBAN   triamcinolone 0.1 % Commonly known as: KENALOG     TAKE these medications   amLODipine 10 MG tablet Commonly known as: NORVASC Take 10 mg by mouth daily.   atorvastatin 40 MG tablet Commonly known as: LIPITOR Take 40 mg by mouth daily.   buPROPion 150 MG 12 hr tablet Commonly known as: WELLBUTRIN SR Take 150 mg by mouth 2 (two) times daily.   buPROPion 150 MG 24 hr tablet Commonly known as: WELLBUTRIN XL Take 300 mg by mouth daily.   Calcium 600+D 600-400 MG-UNIT Tabs Generic drug: Calcium Carbonate-Vitamin D3 Take 1 tablet by mouth 2 (two) times daily.   Flovent HFA 110 MCG/ACT inhaler Generic drug: fluticasone Inhale 2 puffs into the lungs 2 (two) times daily.   fluticasone 50 MCG/ACT nasal spray Commonly known as: FLONASE Place 2 sprays into both nostrils daily.   hydrochlorothiazide 25 MG tablet Commonly known as: HYDRODIURIL Take 25 mg by mouth daily. for high blood pressure   ibuprofen 800 MG tablet Commonly known as: ADVIL Take 1 tablet (800 mg total) by mouth every 8 (eight) hours as needed.   losartan 25 MG tablet Commonly known as: COZAAR Take 25 mg by mouth daily.   montelukast 10 MG tablet Commonly known as: SINGULAIR Take 10 mg by mouth at bedtime.   omeprazole 40 MG capsule Commonly known as: PRILOSEC Take 40 mg by mouth daily.   albuterol (2.5 MG/3ML) 0.083% nebulizer solution Commonly known as: PROVENTIL Take 2.5 mg by nebulization every 6 (six) hours as needed for wheezing or shortness of breath.   ProAir HFA  108 (90 Base) MCG/ACT inhaler Generic drug: albuterol 2 puff  every four to six hours as needed for wheezing, shortness of breath       Vitals:   02/17/21 0607 02/17/21 0726  BP: (!) 151/83 (!) 142/98  Pulse: 83 74  Resp: 18 17  Temp: (!) 97.4 F (36.3 C) 98.2 F (36.8 C)  SpO2: 99% 100%    Skin clean, dry and intact without evidence of skin break down, no evidence of skin tears noted. IV catheter discontinued intact. Site without signs and symptoms of complications. Dressing and pressure applied. Pt denies pain at this time. No complaints noted.  An After Visit Summary was printed and given to the patient. Patient escorted via WC, and D/C home via private auto.  Jamie Wilkins Jamie Wilkins

## 2021-02-18 ENCOUNTER — Other Ambulatory Visit: Payer: Self-pay | Admitting: *Deleted

## 2021-02-18 NOTE — Patient Outreach (Signed)
[facility]) Care Management  02/18/2021  Jamie Wilkins Dec 24, 1960 [id number]   Transition of care telephone call  Referral received:02/15/21 Initial outreach:02/18/21 Insurance: [facility]  Initial unsuccessful telephone call to patient's preferred number in order to complete transition of care assessment; no answer, left HIPAA compliant voicemail message requesting return call.   Objective: Per electronic medical record Jamie Wilkins  was hospitalized at [facility] 3/27-3/30/33 for Chest pain, hypokalemia, syncope, alcoholic hepatitis without ascites,transsient atrial fib.  Comorbidities include: Asthma, hypertension  She  was discharged to home on 02/17/21  without the need for home health services or DME.   Plan: This RNCM will route unsuccessful outreach letter with [facility] Care Management pamphlet and 24 hour Nurse Advice Line Magnet to [employer] Care Management clinical pool to be mailed to patient's home address. This RNCM will attempt another outreach within 4 business days.  Jamie Garibaldi, RN, BSN  [facility] Care Management,Care Management Coordinator  [phone]- Mobile [phone]- Toll Free Main Office

## 2021-02-18 NOTE — Discharge Summary (Signed)
[place] [facility]   PATIENT NAME: Jamie Wilkins    MR#:  [id number]  DATE OF BIRTH:  10/20/1961  DATE OF ADMISSION:  02/14/2021   ADMITTING PHYSICIAN: Hannah Beat, MD  DATE OF DISCHARGE: 02/17/2021 10:48 AM  PRIMARY CARE PHYSICIAN: Veneda Melter, FNP   ADMISSION DIAGNOSIS:  Hypocalcemia [E83.51] Hypokalemia [E87.6] Hypomagnesemia [E83.42] Transaminitis [R74.01] Pulmonary nodules [R91.8] Elevated LFTs [R79.89] Elevated lipase [R74.8] Chest pain [R07.9] Syncope, unspecified syncope type [R55] Chest pain, unspecified type [R07.9] DISCHARGE DIAGNOSIS:  Active Problems:   Chest pain   Hypocalcemia   Hypokalemia   Hypomagnesemia   Alcoholic hepatitis without ascites   Elevated lipase   Electrolyte abnormality   Malnutrition of moderate degree   Syncope  SECONDARY DIAGNOSIS:   Past Medical History:  Diagnosis Date  . Asthma   . Heavy menstrual period   . Hypertension    HOSPITAL COURSE:  Jamie Wilkins a 60 y.o.femalewith medical history significant forhypertension, asthma and tobacco abuse admitted for syncope and chest pain  Atypical Chest pain ruled out acute coronary syndrome-- negative cardiac enzyme and no acute EKG changes  Echo wnl  Syncope likely due to orthostatic hypotension in the setting of volume depletion as well as severe electrolyte abnormalities, Hypomagnesemia,hypokalemia, hypophosphatemia, hypcalcemia - BP improved with hydration and electrolytes repleted. Patient ambulated without recurrence of symptoms/syncope  Alcoholic hepatitis andelevated lipase level that could be related to mild early pancreatitis not currently appearing on abdominal CT -ultrasound abdomen shows hepatic steatosis. -- Patient advised to abstain from drinking alcohol -- LFTs trending down  Transient A.fib likely triggered by severe electrolyte abnormalities No further treatment need per cardio - patient in sinus rhythm and controlled  rate now.    DISCHARGE CONDITIONS:  stable CONSULTS OBTAINED:  Treatment Team:  Iran Ouch, MD Marisue Ivan D, PA-C DRUG ALLERGIES:  No Known Allergies DISCHARGE MEDICATIONS:   Allergies as of 02/17/2021   No Known Allergies     Medication List    STOP taking these medications   Klor-Con M20 20 MEQ tablet Generic drug: potassium chloride SA   mupirocin ointment 2 % Commonly known as: BACTROBAN   triamcinolone 0.1 % Commonly known as: KENALOG     TAKE these medications   amLODipine 10 MG tablet Commonly known as: NORVASC Take 10 mg by mouth daily.   atorvastatin 40 MG tablet Commonly known as: LIPITOR Take 40 mg by mouth daily.   buPROPion 150 MG 12 hr tablet Commonly known as: WELLBUTRIN SR Take 150 mg by mouth 2 (two) times daily.   buPROPion 150 MG 24 hr tablet Commonly known as: WELLBUTRIN XL Take 300 mg by mouth daily.   Calcium 600+D 600-400 MG-UNIT Tabs Generic drug: Calcium Carbonate-Vitamin D3 Take 1 tablet by mouth 2 (two) times daily.   Flovent HFA 110 MCG/ACT inhaler Generic drug: fluticasone Inhale 2 puffs into the lungs 2 (two) times daily.   fluticasone 50 MCG/ACT nasal spray Commonly known as: FLONASE Place 2 sprays into both nostrils daily.   hydrochlorothiazide 25 MG tablet Commonly known as: HYDRODIURIL Take 25 mg by mouth daily. for high blood pressure   ibuprofen 800 MG tablet Commonly known as: ADVIL Take 1 tablet (800 mg total) by mouth every 8 (eight) hours as needed.   losartan 25 MG tablet Commonly known as: COZAAR Take 25 mg by mouth daily.   montelukast 10 MG tablet Commonly known as: SINGULAIR Take 10 mg by mouth at bedtime.   omeprazole  40 MG capsule Commonly known as: PRILOSEC Take 40 mg by mouth daily.   albuterol (2.5 MG/3ML) 0.083% nebulizer solution Commonly known as: PROVENTIL Take 2.5 mg by nebulization every 6 (six) hours as needed for wheezing or shortness of breath.   ProAir HFA 108  (90 Base) MCG/ACT inhaler Generic drug: albuterol 2 puff  every four to six hours as needed for wheezing, shortness of breath      DISCHARGE INSTRUCTIONS:   DIET:  Cardiac diet DISCHARGE CONDITION:  Stable ACTIVITY:  Activity as tolerated OXYGEN:  Home Oxygen: No.  Oxygen Delivery: room air DISCHARGE LOCATION:  home   If you experience worsening of your admission symptoms, develop shortness of breath, life threatening emergency, suicidal or homicidal thoughts you must seek medical attention immediately by calling 911 or calling your MD immediately  if symptoms less severe.  You Must read complete instructions/literature along with all the possible adverse reactions/side effects for all the Medicines you take and that have been prescribed to you. Take any new Medicines after you have completely understood and accpet all the possible adverse reactions/side effects.   Please note  You were cared for by a hospitalist during your hospital stay. If you have any questions about your discharge medications or the care you received while you were in the hospital after you are discharged, you can call the unit and asked to speak with the hospitalist on call if the hospitalist that took care of you is not available. Once you are discharged, your primary care physician will handle any further medical issues. Please note that NO REFILLS for any discharge medications will be authorized once you are discharged, as it is imperative that you return to your primary care physician (or establish a relationship with a primary care physician if you do not have one) for your aftercare needs so that they can reassess your need for medications and monitor your lab values.    On the day of Discharge:  VITAL SIGNS:  Blood pressure (!) 142/98, pulse 74, temperature 98.2 F (36.8 C), temperature source Oral, resp. rate 17, height 5\' 7"  (1.702 m), weight 65 kg, last menstrual period 05/11/2010, SpO2 100  %. PHYSICAL EXAMINATION:  GENERAL:  60 y.o.-year-old patient lying in the bed with no acute distress.  EYES: Pupils equal, round, reactive to light and accommodation. No scleral icterus. Extraocular muscles intact.  HEENT: Head atraumatic, normocephalic. Oropharynx and nasopharynx clear.  NECK:  Supple, no jugular venous distention. No thyroid enlargement, no tenderness.  LUNGS: Normal breath sounds bilaterally, no wheezing, rales,rhonchi or crepitation. No use of accessory muscles of respiration.  CARDIOVASCULAR: S1, S2 normal. No murmurs, rubs, or gallops.  ABDOMEN: Soft, non-tender, non-distended. Bowel sounds present. No organomegaly or mass.  EXTREMITIES: No pedal edema, cyanosis, or clubbing.  NEUROLOGIC: Cranial nerves II through XII are intact. Muscle strength 5/5 in all extremities. Sensation intact. Gait not checked.  PSYCHIATRIC: The patient is alert and oriented x 3.  SKIN: No obvious rash, lesion, or ulcer.  DATA REVIEW:   CBC Recent Labs  Lab 02/15/21 0522  WBC 3.8*  HGB 10.2*  HCT 29.7*  PLT 218    Chemistries  Recent Labs  Lab 02/17/21 0431 02/17/21 0816  NA 135  --   K 5.5* 4.9  CL 97*  --   CO2 27  --   GLUCOSE 65*  --   BUN 9  --   CREATININE 1.00  --   CALCIUM 7.2*  --   MG  1.9  --   AST 115*  --   ALT 37  --   ALKPHOS 135*  --   BILITOT 1.0  --      Outpatient follow-up  Follow-up Information    Veneda Melter, FNP. Schedule an appointment as soon as possible for a visit on 02/26/2021.   Specialty: Nurse Practitioner Why: @ 10am Contact information: [address] [place] [address] [phone]               30 Day Unplanned Readmission Risk Score   Flowsheet Row ED to Hosp-Admission (Discharged) from 02/14/2021 in [facility] PCU  30 Day Unplanned Readmission Risk Score (%) 14.86 Filed at 02/17/2021 0801     This score is the patient's risk of an unplanned readmission within 30 days of being discharged  (0 -100%). The score is based on dignosis, age, lab data, medications, orders, and past utilization.   Low:  0-14.9   Medium: 15-21.9   High: 22-29.9   Extreme: 30 and above         Management plans discussed with the patient, family and they are in agreement.  CODE STATUS: Prior   TOTAL TIME TAKING CARE OF THIS PATIENT: 45 minutes.    Delfino Lovett M.D on 02/18/2021 at 10:19 AM  [facility]   CC: Primary care physician; Veneda Melter, FNP   Note: This dictation was prepared with Dragon dictation along with smaller phrase technology. Any transcriptional errors that result from this process are unintentional.

## 2021-02-22 ENCOUNTER — Other Ambulatory Visit: Payer: Self-pay | Admitting: *Deleted

## 2021-02-22 NOTE — Patient Outreach (Signed)
[facility]) Care Management  02/22/2021  Jamie Wilkins 1961-02-23 [id number]   Transition of care call Referral received: 02/15/21 Initial outreach attempt: 02/18/21 Insurance: [employer] Focus    2nd unsuccessful telephone call to patient's preferred contact number in order to complete post hospital discharge transition of care assessment , no answer unable to leave a return call message.Marland Kitchen Unsuccessful call attempt x 2.    Objective: Per electronic medical record Jamie Wilkins was hospitalized [facility] 3/27-3/30/33 for Chest pain, hypokalemia, syncope, alcoholic hepatitis without ascites,transsient atrial fib.  Comorbidities include: Asthma, hypertension  She  was discharged to home on 02/17/21  without the need for home health servicesor DME.   Plan If no return call from patient will attempt 3rd outreach in the next 4 business days.   Egbert Garibaldi, RN, BSN  [facility] Care Management,Care Management Coordinator  [phone]- Mobile [phone]- Toll Free Main Office

## 2021-02-25 ENCOUNTER — Other Ambulatory Visit: Payer: Self-pay | Admitting: *Deleted

## 2021-02-25 NOTE — Patient Outreach (Signed)
[facility]) Care Management  02/25/2021  Jamie Wilkins [birth year] [id number]   Covering Jamie Wilkins  Transition of care telephone call  Outreach #3 Unsuccessful telephone call to patient's preferred number in order to complete transition of care assessment; no answer, left HIPAA compliant voicemail message requesting return call.    Plan: RNCM will attempt another outreach within 4 business days.  Elliot Cousin, RN Care Management Coordinator [facility] Main Office [phone]

## 2021-03-11 ENCOUNTER — Other Ambulatory Visit: Payer: Self-pay | Admitting: *Deleted

## 2021-03-11 NOTE — Patient Outreach (Signed)
[facility]) Care Management  03/11/2021  Jamie Wilkins 08-29-1961 [id number]   Transition of care /Case Closure Unsuccessful outreach    Referral received:02/15/21 Initial outreach:02/18/21 Insurance: [employer] Focus  Outreach attempt #4 Unable to complete post hospital discharge transition of care assessment. No return call form patient after 3 call attempts and no response to request to contact RN Care Coordinator in unsuccessful outreach letter mailed to home on 02/18/21/22.  Objective:  [facility] Chest pain, hypokalemia, syncope, alcoholic hepatitis without ascites,transient atrial fib.Comorbidities include: Asthma, hypertension  Shewas discharged to home on 3/30/22without the need for home health servicesor DME.  Plan Case closed to [facility] [employer] care management services, unsuccessful call attempts x 4, no response from outreach letter    Egbert Garibaldi, RN, BSN  [facility] Care Management,Care Management Coordinator  [phone]- Mobile [phone]- Toll Free Main Office

## 2021-03-12 ENCOUNTER — Other Ambulatory Visit: Payer: Self-pay

## 2021-03-12 ENCOUNTER — Emergency Department
Admission: EM | Admit: 2021-03-12 | Discharge: 2021-03-12 | Disposition: A | Discharge status: Home / Self Care | Payer: Self-pay | Attending: Emergency Medicine | Admitting: Emergency Medicine

## 2021-03-12 DIAGNOSIS — J45909 Unspecified asthma, uncomplicated: Secondary | ICD-10-CM | POA: Insufficient documentation

## 2021-03-12 DIAGNOSIS — F17210 Nicotine dependence, cigarettes, uncomplicated: Secondary | ICD-10-CM | POA: Insufficient documentation

## 2021-03-12 DIAGNOSIS — R45851 Suicidal ideations: Secondary | ICD-10-CM | POA: Insufficient documentation

## 2021-03-12 DIAGNOSIS — Z79899 Other long term (current) drug therapy: Secondary | ICD-10-CM | POA: Insufficient documentation

## 2021-03-12 DIAGNOSIS — F32A Depression, unspecified: Secondary | ICD-10-CM

## 2021-03-12 DIAGNOSIS — F329 Major depressive disorder, single episode, unspecified: Secondary | ICD-10-CM | POA: Insufficient documentation

## 2021-03-12 DIAGNOSIS — I10 Essential (primary) hypertension: Secondary | ICD-10-CM | POA: Insufficient documentation

## 2021-03-12 LAB — CBC
HCT: 30.4 % — ABNORMAL LOW (ref 36.0–46.0)
Hemoglobin: 10.7 g/dL — ABNORMAL LOW (ref 12.0–15.0)
MCH: 36.4 pg — ABNORMAL HIGH (ref 26.0–34.0)
MCHC: 35.2 g/dL (ref 30.0–36.0)
MCV: 103.4 fL — ABNORMAL HIGH (ref 80.0–100.0)
Platelets: 229 10*3/uL (ref 150–400)
RBC: 2.94 MIL/uL — ABNORMAL LOW (ref 3.87–5.11)
RDW: 17.8 % — ABNORMAL HIGH (ref 11.5–15.5)
WBC: 3.7 10*3/uL — ABNORMAL LOW (ref 4.0–10.5)
nRBC: 1.1 % — ABNORMAL HIGH (ref 0.0–0.2)

## 2021-03-12 LAB — COMPREHENSIVE METABOLIC PANEL
ALT: 78 U/L — ABNORMAL HIGH (ref 0–44)
AST: 246 U/L — ABNORMAL HIGH (ref 15–41)
Albumin: 3.4 g/dL — ABNORMAL LOW (ref 3.5–5.0)
Alkaline Phosphatase: 237 U/L — ABNORMAL HIGH (ref 38–126)
Anion gap: 16 — ABNORMAL HIGH (ref 5–15)
BUN: 8 mg/dL (ref 6–20)
CO2: 24 mmol/L (ref 22–32)
Calcium: 8.6 mg/dL — ABNORMAL LOW (ref 8.9–10.3)
Chloride: 98 mmol/L (ref 98–111)
Creatinine, Ser: 0.79 mg/dL (ref 0.44–1.00)
GFR, Estimated: >60 mL/min (ref >60)
Glucose, Bld: 85 mg/dL (ref 70–99)
Potassium: 3.3 mmol/L — ABNORMAL LOW (ref 3.5–5.1)
Sodium: 138 mmol/L (ref 135–145)
Total Bilirubin: 1.2 mg/dL (ref 0.3–1.2)
Total Protein: 6.8 g/dL (ref 6.5–8.1)

## 2021-03-12 LAB — ACETAMINOPHEN LEVEL: Acetaminophen (Tylenol), Serum: <10 ug/mL — ABNORMAL LOW (ref 10–30)

## 2021-03-12 LAB — URINE DRUG SCREEN, QUALITATIVE (ARMC ONLY)
Amphetamines, Ur Screen: NOT DETECTED
Barbiturates, Ur Screen: NOT DETECTED
Benzodiazepine, Ur Scrn: NOT DETECTED
Cannabinoid 50 Ng, Ur ~~LOC~~: NOT DETECTED
Cocaine Metabolite,Ur ~~LOC~~: NOT DETECTED
MDMA (Ecstasy)Ur Screen: NOT DETECTED
Methadone Scn, Ur: NOT DETECTED
Opiate, Ur Screen: NOT DETECTED
Phencyclidine (PCP) Ur S: NOT DETECTED
Tricyclic, Ur Screen: NOT DETECTED

## 2021-03-12 LAB — ETHANOL: Alcohol, Ethyl (B): 283 mg/dL — ABNORMAL HIGH (ref <10)

## 2021-03-12 LAB — SALICYLATE LEVEL: Salicylate Lvl: <7 mg/dL — ABNORMAL LOW (ref 7.0–30.0)

## 2021-03-12 NOTE — ED Notes (Signed)
Pt dressed out with this RN and Vet NT. Belongings include: Black crocks Black socks  White t shirt Blue/gray leggings Book bag Black tank top bra

## 2021-03-12 NOTE — ED Notes (Signed)
Vol pending consult 

## 2021-03-12 NOTE — ED Notes (Signed)
2 of 2 bags moved to BHU belonging room. Labels intact. 

## 2021-03-12 NOTE — ED Notes (Signed)
Pt states she believes her ride is about to be here and needs to go. Pt goes to lobby and states that she will obtain DC paper work tomorrow morning. Has information for [facility] from [employer].

## 2021-03-12 NOTE — ED Notes (Signed)
Pt leaves without vitals and signature.

## 2021-03-12 NOTE — ED Provider Notes (Signed)
[facility] Emergency Department Provider Note   ____________________________________________   I have reviewed the triage vital signs and the nursing notes.   HISTORY  Chief Complaint Suicidal    History limited by: Not Limited   HPI Jamie Wilkins is a 60 y.o. female who presents to the emergency department today because of concern for depression. She states that it started after her recent admission to the hospital. Since being home she has felt increasingly lonely and depressed. She has had occasional thoughts of wanting to hurt herself but does not feel she would actually harm herself. She denies any previous history of depression. Did see her doctor yesterday and states that she was told she was given a prescription for depression but could not find it.   Records reviewed. Per medical record review patient has a history of recent admission to the hospital for chest pain and syncope, was found to have multiple electrolyte abnormalities. Alcohol abuse.   Past Medical History:  Diagnosis Date  . Asthma   . Heavy menstrual period   . Hypertension     Patient Active Problem List   Diagnosis Date Noted  . Malnutrition of moderate degree 02/17/2021  . Syncope   . Elevated lipase   . Electrolyte abnormality   . Chest pain 02/15/2021  . Hypocalcemia   . Hypokalemia   . Hypomagnesemia   . Alcoholic hepatitis without ascites     Past Surgical History:  Procedure Laterality Date  . ABDOMINAL HYSTERECTOMY     partial  . FOOT SURGERY    . S/P BTL      Prior to Admission medications   Medication Sig Start Date End Date Taking? Authorizing Provider  albuterol (PROVENTIL) (2.5 MG/3ML) 0.083% nebulizer solution Take 2.5 mg by nebulization every 6 (six) hours as needed for wheezing or shortness of breath.    [provider]  amLODipine (NORVASC) 10 MG tablet Take 10 mg by mouth daily.    [provider]  atorvastatin (LIPITOR) 40  MG tablet Take 40 mg by mouth daily.    [provider]  buPROPion (WELLBUTRIN SR) 150 MG 12 hr tablet Take 150 mg by mouth 2 (two) times daily.    [provider]  buPROPion (WELLBUTRIN XL) 150 MG 24 hr tablet Take 300 mg by mouth daily. 01/14/21   [provider]  CALCIUM 600+D 600-400 MG-UNIT tablet Take 1 tablet by mouth 2 (two) times daily. 10/20/20   [provider]  FLOVENT HFA 110 MCG/ACT inhaler Inhale 2 puffs into the lungs 2 (two) times daily. 01/14/21   [provider]  fluticasone (FLONASE) 50 MCG/ACT nasal spray Place 2 sprays into both nostrils daily.    [provider]  hydrochlorothiazide (HYDRODIURIL) 25 MG tablet Take 25 mg by mouth daily. for high blood pressure 09/18/20   [provider]  ibuprofen (ADVIL) 800 MG tablet Take 1 tablet (800 mg total) by mouth every 8 (eight) hours as needed. 10/06/20   Mickie Bail, NP  losartan (COZAAR) 25 MG tablet Take 25 mg by mouth daily.    [provider]  montelukast (SINGULAIR) 10 MG tablet Take 10 mg by mouth at bedtime. 08/07/20   [provider]  omeprazole (PRILOSEC) 40 MG capsule Take 40 mg by mouth daily.    [provider]  PROAIR HFA 108 [id number] Base) MCG/ACT inhaler 2 puff  every four to six hours as needed for wheezing, shortness of breath 10/20/20   [provider]    Allergies Patient has no known allergies.  No family history on file.  Social History Social History   Tobacco Use  . Smoking status: Current Every Day Smoker    Packs/day: 0.50    Types: Cigarettes  . Smokeless tobacco: Never Used  Substance Use Topics  . Alcohol use: Yes    Alcohol/week: 7.0 standard drinks    Types: 7 Standard drinks or equivalent per week  . Drug use: Never    Review of Systems Constitutional: No fever/chills Eyes: No visual changes. ENT: No sore throat. Cardiovascular: Denies chest pain. Respiratory: Denies shortness of  breath. Gastrointestinal: No abdominal pain.  No nausea, no vomiting.  No diarrhea.   Genitourinary: Negative for dysuria. Musculoskeletal: Negative for back pain. Skin: Negative for rash. Neurological: Negative for headaches, focal weakness or numbness.  ____________________________________________   PHYSICAL EXAM:  VITAL SIGNS: ED Triage Vitals  Enc Vitals Group     BP 03/12/21 1502 123/84     Pulse Rate 03/12/21 1502 (!) 103     Resp 03/12/21 1502 18     Temp 03/12/21 1502 98.3 F (36.8 C)     Temp Source 03/12/21 1502 Oral     SpO2 03/12/21 1502 97 %     Weight 03/12/21 1503 134 lb (60.8 kg)     Height 03/12/21 1503 5\' 7"  (1.702 m)     Head Circumference --      Peak Flow --      Pain Score 03/12/21 1502 0   Constitutional: Alert and oriented.  Eyes: Conjunctivae are normal.  ENT      Head: Normocephalic and atraumatic.      Nose: No congestion/rhinnorhea.      Mouth/Throat: Mucous membranes are moist.      Neck: No stridor. Cardiovascular: Normal rate, regular rhythm.  No murmurs, rubs, or gallops.  Respiratory: Normal respiratory effort without tachypnea nor retractions. Breath sounds are clear and equal bilaterally. No wheezes/rales/rhonchi. Gastrointestinal: Soft and non tender. No rebound. No guarding.  Genitourinary: Deferred Musculoskeletal: Normal range of motion in all extremities. . Neurologic:  Normal speech and language. No gross focal neurologic deficits are appreciated.  Skin:  Skin is warm, dry and intact. No rash noted. Psychiatric: Depressed  ____________________________________________    LABS (pertinent positives/negatives)  CBC wbc 3.7, hgb 10.7, plt 229 Acetaminophen and salicylate below threshold Ethanol 283 UDS none detected CMP na 138, k 3.3, glu 85, cr 0.79 ____________________________________________   EKG  None  ____________________________________________     RADIOLOGY  None  ____________________________________________   PROCEDURES  Procedures  ____________________________________________   INITIAL IMPRESSION / ASSESSMENT AND PLAN / ED COURSE  Pertinent labs & imaging results that were available during my care of the patient were reviewed by me and considered in my medical decision making (see chart for details).   Patient presented to the emergency department today because of concern for depression. Patient was seen by psychiatry team. At this time they did not feel patient required inpatient admission. Did recommend discharge with outpatient follow up. Patient left prior to my being able to prepare paperwork.   ____________________________________________   FINAL CLINICAL IMPRESSION(S) / ED DIAGNOSES  Depression  Note: This dictation was prepared with Dragon dictation. Any transcriptional errors that result from this process are unintentional     03/14/21, MD 03/12/21 2231

## 2021-03-12 NOTE — ED Notes (Signed)
Report received from Ally, RN including SBAR. Patient alert and oriented, warm and dry, and in no acute distress. Patient denies SI, HI, AVH and pain. Patient made aware of Q15 minute rounds and Rover and Officer presence for their safety. Patient instructed to come to this nurse with needs or concerns.  

## 2021-03-12 NOTE — ED Triage Notes (Signed)
Pt to ED POV for suicidal ideations and depression. Denies plan to harm self.  States she is lonely.  Pt states is daily drinker, l;ast drink today

## 2021-03-12 NOTE — ED Notes (Signed)
Pt is able to be DC'd per Lodema Pilot, NP and Dr. Derrill Kay. Awaiting for pt DC paper work, pt changing back into own belongings.

## 2021-03-12 NOTE — ED Notes (Signed)
Pt taken back to [facility], RN informed pt she had to take hairbow out since she was suicidal, pt asked "who said  I was suicidal?" RN informed pt that she had just informed staff she was having thoughts of harming self

## 2021-03-12 NOTE — ED Notes (Signed)
EDP at bedside  

## 2021-03-12 NOTE — ED Notes (Signed)
Pt given dinner  

## 2021-06-09 ENCOUNTER — Inpatient Hospital Stay
Admission: EM | Admit: 2021-06-09 | Discharge: 2021-06-12 | DRG: 391 | Disposition: A | Discharge status: Home / Self Care | Payer: Self-pay | Attending: Obstetrics and Gynecology | Admitting: Obstetrics and Gynecology

## 2021-06-09 ENCOUNTER — Emergency Department: Payer: Self-pay

## 2021-06-09 ENCOUNTER — Other Ambulatory Visit: Payer: Self-pay

## 2021-06-09 DIAGNOSIS — R1011 Right upper quadrant pain: Secondary | ICD-10-CM

## 2021-06-09 DIAGNOSIS — I10 Essential (primary) hypertension: Secondary | ICD-10-CM | POA: Diagnosis present

## 2021-06-09 DIAGNOSIS — K921 Melena: Secondary | ICD-10-CM

## 2021-06-09 DIAGNOSIS — Z7951 Long term (current) use of inhaled steroids: Secondary | ICD-10-CM

## 2021-06-09 DIAGNOSIS — D649 Anemia, unspecified: Secondary | ICD-10-CM | POA: Diagnosis present

## 2021-06-09 DIAGNOSIS — E43 Unspecified severe protein-calorie malnutrition: Secondary | ICD-10-CM | POA: Insufficient documentation

## 2021-06-09 DIAGNOSIS — K7010 Alcoholic hepatitis without ascites: Secondary | ICD-10-CM | POA: Diagnosis present

## 2021-06-09 DIAGNOSIS — I480 Paroxysmal atrial fibrillation: Secondary | ICD-10-CM

## 2021-06-09 DIAGNOSIS — E878 Other disorders of electrolyte and fluid balance, not elsewhere classified: Secondary | ICD-10-CM | POA: Diagnosis present

## 2021-06-09 DIAGNOSIS — E440 Moderate protein-calorie malnutrition: Secondary | ICD-10-CM | POA: Diagnosis present

## 2021-06-09 DIAGNOSIS — F17210 Nicotine dependence, cigarettes, uncomplicated: Secondary | ICD-10-CM | POA: Diagnosis present

## 2021-06-09 DIAGNOSIS — F1020 Alcohol dependence, uncomplicated: Secondary | ICD-10-CM | POA: Diagnosis present

## 2021-06-09 DIAGNOSIS — Z6820 Body mass index (BMI) 20.0-20.9, adult: Secondary | ICD-10-CM

## 2021-06-09 DIAGNOSIS — K709 Alcoholic liver disease, unspecified: Secondary | ICD-10-CM | POA: Diagnosis present

## 2021-06-09 DIAGNOSIS — E876 Hypokalemia: Secondary | ICD-10-CM | POA: Diagnosis present

## 2021-06-09 DIAGNOSIS — R531 Weakness: Secondary | ICD-10-CM

## 2021-06-09 DIAGNOSIS — I959 Hypotension, unspecified: Secondary | ICD-10-CM | POA: Diagnosis present

## 2021-06-09 DIAGNOSIS — D62 Acute posthemorrhagic anemia: Secondary | ICD-10-CM | POA: Diagnosis present

## 2021-06-09 DIAGNOSIS — J45909 Unspecified asthma, uncomplicated: Secondary | ICD-10-CM | POA: Diagnosis present

## 2021-06-09 DIAGNOSIS — K922 Gastrointestinal hemorrhage, unspecified: Secondary | ICD-10-CM

## 2021-06-09 DIAGNOSIS — Z20822 Contact with and (suspected) exposure to covid-19: Secondary | ICD-10-CM | POA: Diagnosis present

## 2021-06-09 DIAGNOSIS — E871 Hypo-osmolality and hyponatremia: Secondary | ICD-10-CM | POA: Diagnosis present

## 2021-06-09 DIAGNOSIS — K529 Noninfective gastroenteritis and colitis, unspecified: Principal | ICD-10-CM

## 2021-06-09 DIAGNOSIS — K760 Fatty (change of) liver, not elsewhere classified: Secondary | ICD-10-CM | POA: Diagnosis present

## 2021-06-09 DIAGNOSIS — Z79899 Other long term (current) drug therapy: Secondary | ICD-10-CM

## 2021-06-09 DIAGNOSIS — Z90710 Acquired absence of both cervix and uterus: Secondary | ICD-10-CM

## 2021-06-09 DIAGNOSIS — K449 Diaphragmatic hernia without obstruction or gangrene: Secondary | ICD-10-CM | POA: Diagnosis present

## 2021-06-09 LAB — MAGNESIUM: Magnesium: 0.9 mg/dL — CL (ref 1.7–2.4)

## 2021-06-09 LAB — ABO/RH: ABO/RH(D): A POS

## 2021-06-09 LAB — CBC WITH DIFFERENTIAL/PLATELET
Abs Immature Granulocytes: 0.03 10*3/uL (ref 0.00–0.07)
Basophils Absolute: 0 10*3/uL (ref 0.0–0.1)
Basophils Relative: 0 %
Eosinophils Absolute: 0 10*3/uL (ref 0.0–0.5)
Eosinophils Relative: 0 %
HCT: 14.2 % — CL (ref 36.0–46.0)
Hemoglobin: 4.7 g/dL — CL (ref 12.0–15.0)
Immature Granulocytes: 1 %
Lymphocytes Relative: 13 %
Lymphs Abs: 0.6 10*3/uL — ABNORMAL LOW (ref 0.7–4.0)
MCH: 27.5 pg (ref 26.0–34.0)
MCHC: 33.1 g/dL (ref 30.0–36.0)
MCV: 83 fL (ref 80.0–100.0)
Monocytes Absolute: 0.3 10*3/uL (ref 0.1–1.0)
Monocytes Relative: 7 %
Neutro Abs: 3.7 10*3/uL (ref 1.7–7.7)
Neutrophils Relative %: 79 %
Platelets: 338 10*3/uL (ref 150–400)
RBC: 1.71 MIL/uL — ABNORMAL LOW (ref 3.87–5.11)
RDW: 21.8 % — ABNORMAL HIGH (ref 11.5–15.5)
Smear Review: NORMAL
WBC: 4.7 10*3/uL (ref 4.0–10.5)
nRBC: 0.6 % — ABNORMAL HIGH (ref 0.0–0.2)

## 2021-06-09 LAB — COMPREHENSIVE METABOLIC PANEL
ALT: 21 U/L (ref 0–44)
AST: 53 U/L — ABNORMAL HIGH (ref 15–41)
Albumin: 2.6 g/dL — ABNORMAL LOW (ref 3.5–5.0)
Alkaline Phosphatase: 184 U/L — ABNORMAL HIGH (ref 38–126)
Anion gap: 17 — ABNORMAL HIGH (ref 5–15)
BUN: 23 mg/dL — ABNORMAL HIGH (ref 6–20)
CO2: 28 mmol/L (ref 22–32)
Calcium: 7.5 mg/dL — ABNORMAL LOW (ref 8.9–10.3)
Chloride: 85 mmol/L — ABNORMAL LOW (ref 98–111)
Creatinine, Ser: 1.05 mg/dL — ABNORMAL HIGH (ref 0.44–1.00)
GFR, Estimated: >60 mL/min (ref >60)
Glucose, Bld: 98 mg/dL (ref 70–99)
Potassium: 3.1 mmol/L — ABNORMAL LOW (ref 3.5–5.1)
Sodium: 130 mmol/L — ABNORMAL LOW (ref 135–145)
Total Bilirubin: 2.3 mg/dL — ABNORMAL HIGH (ref 0.3–1.2)
Total Protein: 5.7 g/dL — ABNORMAL LOW (ref 6.5–8.1)

## 2021-06-09 LAB — CBC
HCT: 18.7 % — ABNORMAL LOW (ref 36.0–46.0)
Hemoglobin: 6.1 g/dL — ABNORMAL LOW (ref 12.0–15.0)
MCH: 26 pg (ref 26.0–34.0)
MCHC: 32.6 g/dL (ref 30.0–36.0)
MCV: 79.6 fL — ABNORMAL LOW (ref 80.0–100.0)
Platelets: 251 10*3/uL (ref 150–400)
RBC: 2.35 MIL/uL — ABNORMAL LOW (ref 3.87–5.11)
RDW: 23 % — ABNORMAL HIGH (ref 11.5–15.5)
WBC: 4.4 10*3/uL (ref 4.0–10.5)
nRBC: 0.7 % — ABNORMAL HIGH (ref 0.0–0.2)

## 2021-06-09 LAB — LACTIC ACID, PLASMA
Lactic Acid, Venous: 1.6 mmol/L (ref 0.5–1.9)
Lactic Acid, Venous: 1.2 mmol/L (ref 0.5–1.9)

## 2021-06-09 LAB — RESP PANEL BY RT-PCR (FLU A&B, COVID) ARPGX2
Influenza A by PCR: NEGATIVE
Influenza B by PCR: NEGATIVE
SARS Coronavirus 2 by RT PCR: NEGATIVE

## 2021-06-09 LAB — GLUCOSE, CAPILLARY: Glucose-Capillary: 87 mg/dL (ref 70–99)

## 2021-06-09 LAB — LIPASE, BLOOD: Lipase: 78 U/L — ABNORMAL HIGH (ref 11–51)

## 2021-06-09 LAB — PREPARE RBC (CROSSMATCH)

## 2021-06-09 MED ORDER — ONDANSETRON HCL 4 MG/2ML IJ SOLN: 4.0000 mg | Freq: Four times a day (QID) | INTRAMUSCULAR | Status: DC | PRN

## 2021-06-09 MED ORDER — PANTOPRAZOLE INFUSION (NEW) - SIMPLE MED
8.0000 mg/h | INTRAVENOUS | Status: DC
  Administered 2021-06-09 – 2021-06-10 (×3): 8 mg/h via INTRAVENOUS
  Filled 2021-06-09: qty 100
  Filled 2021-06-09: qty 80
  Filled 2021-06-09: qty 100
  Filled 2021-06-09: qty 80
  Filled 2021-06-09: qty 100

## 2021-06-09 MED ORDER — THIAMINE HCL 100 MG/ML IJ SOLN
Freq: Once | INTRAVENOUS | Status: AC
  Filled 2021-06-09 (×4): qty 1000

## 2021-06-09 MED ORDER — IOHEXOL 300 MG/ML  SOLN
75.0000 mL | Freq: Once | INTRAMUSCULAR | Status: AC | PRN
  Administered 2021-06-09: 75 mL via INTRAVENOUS

## 2021-06-09 MED ORDER — PANTOPRAZOLE SODIUM 40 MG IV SOLR: 40.0000 mg | Freq: Two times a day (BID) | INTRAVENOUS | Status: DC

## 2021-06-09 MED ORDER — PANTOPRAZOLE 80MG IVPB - SIMPLE MED
80.0000 mg | Freq: Once | INTRAVENOUS | Status: AC
  Administered 2021-06-09: 80 mg via INTRAVENOUS
  Filled 2021-06-09: qty 80

## 2021-06-09 MED ORDER — ONDANSETRON HCL 4 MG/2ML IJ SOLN
4.0000 mg | Freq: Once | INTRAMUSCULAR | Status: AC
  Administered 2021-06-09: 4 mg via INTRAVENOUS
  Filled 2021-06-09: qty 2

## 2021-06-09 MED ORDER — SODIUM CHLORIDE 0.9 % IV BOLUS
1000.0000 mL | Freq: Once | INTRAVENOUS | Status: AC
  Administered 2021-06-09: 1000 mL via INTRAVENOUS

## 2021-06-09 MED ORDER — CHLORHEXIDINE GLUCONATE CLOTH 2 % EX PADS
6.0000 | MEDICATED_PAD | Freq: Every day | CUTANEOUS | Status: DC
  Administered 2021-06-12: 10:00:00 6 via TOPICAL
  Filled 2021-06-09: qty 6

## 2021-06-09 MED ORDER — ONDANSETRON HCL 4 MG PO TABS: 4.0000 mg | ORAL_TABLET | Freq: Four times a day (QID) | ORAL | Status: DC | PRN

## 2021-06-09 MED ORDER — SODIUM CHLORIDE 0.9 % IV SOLN
10.0000 mL/h | Freq: Once | INTRAVENOUS | Status: AC
  Administered 2021-06-09: 10 mL/h via INTRAVENOUS

## 2021-06-09 MED ORDER — SODIUM CHLORIDE 0.9 % IV SOLN: 10.0000 mL/h | Freq: Once | INTRAVENOUS | Status: DC

## 2021-06-09 MED ORDER — POTASSIUM CHLORIDE 10 MEQ/100ML IV SOLN
10.0000 meq | INTRAVENOUS | Status: AC
Start: 2021-06-09 — End: 2021-06-09
  Administered 2021-06-09: 10 meq via INTRAVENOUS
  Filled 2021-06-09: qty 100

## 2021-06-09 NOTE — ED Notes (Signed)
Verified with Greig Right PA the number of PRBC to transfuse. Per Darl Pikes, transfuse 3 units PRBC.

## 2021-06-09 NOTE — Consult Note (Signed)
GI Inpatient Consult Note  Reason for Consult: Melena/Acute blood loss anemia   Attending Requesting Consult:  History of Present Illness: Jamie Wilkins is a 60 y.o. female seen for evaluation of melena and profound anemia at the request of ED physician - Dr. Willy Eddy. Pt has a PMH of asthma, HTN, alcohol abuse. She presented to the [facility] ED via EMS this afternoon from the [facility] for chief complaint of generalized weakness and vomiting with associated RUQ/epigastric abdominal pain. She reports symptoms have been present for about three months, but acutely worsened over the past two days. She reports she felt so weak like she was going to pass out. She also reports over the past month she has noticed black, tarry stools off an on. She has never had symptoms like these before. Upon presentation to the ED, she was found to have profound anemia with hemoglobin 4.7/hematocrit 14.2% which is decreased from 10.2 three months ago. She was also found to have mildly elevated LFTs, hypokalemia, and hyponatremia. GI was consulted in context of profound anemia and concern for GI bleed.   Patient reports she has never had a history of GI bleeding. She does not take any acid suppression therapy on a daily basis. She denies any frequent NSAID use. She does smoke about 10 cigarettes daily. She endorses nightly liquor consumption. She reports over the past month she has noticed black, tarry stools, non-bloody and non-bilious emesis, and associated RUQ/epigastric abdominal pain. She denies any frank hematemesis. She denies any frequent heartburn or acid reflux symptoms. She denies fever, dysphagia, odynophagia, early satiety, or hoarseness. She has never had an endoscopy or colonoscopy. She had CT scan performed this afternoon which showed diffuse hepatic steatosis with no morphologic features of cirrhosis or portal hypertension, no overt masses or bleeding. She denies any diarrhea or constipation. Appetite  has been stable.    Past Medical History:  Past Medical History:  Diagnosis Date   Asthma    Heavy menstrual period    Hypertension     Problem List: Patient Active Problem List   Diagnosis Date Noted   Malnutrition of moderate degree 02/17/2021   Syncope    Elevated lipase    Electrolyte abnormality    Chest pain 02/15/2021   Hypocalcemia    Hypokalemia    Hypomagnesemia    Alcoholic hepatitis without ascites     Past Surgical History: Past Surgical History:  Procedure Laterality Date   ABDOMINAL HYSTERECTOMY     partial   FOOT SURGERY     S/P BTL      Allergies: No Known Allergies  Home Medications: (Not in a hospital admission)  Home medication reconciliation was completed with the patient.   Scheduled Inpatient Medications:    [START ON 06/13/2021] pantoprazole  40 mg Intravenous Q12H    Continuous Inpatient Infusions:    sodium chloride     sodium chloride     pantoprazole     potassium chloride      PRN Inpatient Medications:    Family History: family history is not on file.  The patient's family history is negative for inflammatory bowel disorders, GI malignancy, or solid organ transplantation.  Social History:   reports that she has been smoking cigarettes. She has been smoking an average of .5 packs per day. She has never used smokeless tobacco. She reports current alcohol use of about 7.0 standard drinks of alcohol per week. She reports that she does not use drugs. The patient denies ETOH,  tobacco, or drug use.   Review of Systems: Constitutional: Weight is stable.  Eyes: No changes in vision. ENT: No oral lesions, sore throat.  GI: see HPI.  Heme/Lymph: No easy bruising.  CV: No chest pain.  GU: No hematuria.  Integumentary: No rashes.  Neuro: No headaches.  Psych: No depression/anxiety.  Endocrine: No heat/cold intolerance.  Allergic/Immunologic: No urticaria.  Resp: No cough, SOB.  Musculoskeletal: No joint swelling.    Physical  Examination: BP 94/67   Pulse 95   Temp 97.7 F (36.5 C) (Oral)   Resp 15   Ht 5\' 7"  (1.702 m)   Wt 59 kg   LMP 05/11/2010 (Approximate)   SpO2 95%   BMI 20.36 kg/m  Somnolent, but arouseable. Answers all questions appropriately.  Gen: NAD, alert and oriented x 4 HEENT: PEERLA, EOMI, Neck: supple, no JVD or thyromegaly Chest: CTA bilaterally, no wheezes, crackles, or other adventitious sounds CV: RRR, no m/g/c/r Abd: soft,ND, +BS in all four quadrants; tender to deep palpation in epigastric and RUQ, no HSM, guarding, ridigity, or rebound tenderness, neg Murphy's sign  Ext: no edema, well perfused with 2+ pulses, Skin: no rash or lesions noted Lymph: no LAD  Data: Lab Results  Component Value Date   WBC 4.7 06/09/2021   HGB 4.7 (LL) 06/09/2021   HCT 14.2 (LL) 06/09/2021   MCV 83.0 06/09/2021   PLT 338 06/09/2021   Recent Labs  Lab 06/09/21 1343  HGB 4.7*   Lab Results  Component Value Date   NA 130 (L) 06/09/2021   K 3.1 (L) 06/09/2021   CL 85 (L) 06/09/2021   CO2 28 06/09/2021   BUN 23 (H) 06/09/2021   CREATININE 1.05 (H) 06/09/2021   Lab Results  Component Value Date   ALT 21 06/09/2021   AST 53 (H) 06/09/2021   ALKPHOS 184 (H) 06/09/2021   BILITOT 2.3 (H) 06/09/2021   No results for input(s): APTT, INR, PTT in the last 168 hours.  CT abd/pelvis with contrast 06/09/2021: IMPRESSION: 1. Marked hepatic steatosis with limited evaluation for focal hepatic lesion. 2. Gallbladder sludge. No findings suggest acute cholecystitis or choledocholithiasis. 3.  Aortic Atherosclerosis (ICD10-I70.0).   Assessment/Plan:  60 y/o AA female with a PMH of HTN, asthma, and alcohol abuse presents to the [facility] ED this afternoon from [facility] for chief complaint of acute generalized weakness, vomiting, and associated RUQ/epigastric abdominal pain.   1. Profound anemia/Melena/RUQ/epigastric abd pain GI Bleed - hemoglobin 4.7 on presentation decreased from baseline of >10  three months ago. In setting of nausea, vomiting, RUQ/epigastric abdominal pain, concern for UGI bleed. Ddx includes peptic ulcer disease, gastritis +/- H pylori, esophagitis, duodenitis, AVMs, varices, occult GI malignancy, or less likely a colonic source.   2. Alcohol abuse - high risk for withdrawal. Recommend CIWA protocol  3. Electrolyte derangement - hypokalemia, hyponatremia   4. Transaminitis - likely 2/2 #2.   5. Hypotension - 80/60, likely 2/2 profound anemia. Improving with IVF.   6. Alcoholic hepatitis - in setting of alcohol abuse, no indication for prednisone therapy at this time  Recommendations:  - Agree with admission to medicine - Agree with Protonix gtt and IV fluid resuscitation  - Transfuse 2 units pRBCs. Goal Hgb >7.0. Continue monitor H&H closely.  - Correct electrolyte abnormalities - Hold all antiplatelet/anticoagulation. Avoid NSAIDs - CIWA protocol for her history of alcohol abuse and high risk for withdrawal - Plan for EGD and colonoscopy when clinically feasible after her clinical status improves,  anemia improves, and electrolytes repleted - If evidence of overt GI blood loss, please call Dr. Norma Fredrickson - Clear liquid diet for now - Following along with you   Thank you for the consult. Please call with questions or concerns.  Mickle Mallory [facility] Gastroenterology [phone] (Cell)

## 2021-06-09 NOTE — ED Notes (Signed)
Jamie Wilkins (neighbor) emergency contact = [phone]

## 2021-06-09 NOTE — ED Notes (Signed)
Pt. Transported to ICU 20 by Eliberto Ivory, RN

## 2021-06-09 NOTE — ED Provider Notes (Signed)
[facility] Emergency Department Provider Note  ____________________________________________   Event Date/Time   First MD Initiated Contact with Patient 06/09/21 1305     (approximate)  I have reviewed the triage vital signs and the nursing notes.   HISTORY  Chief Complaint Weakness    HPI Jamie Wilkins is a 60 y.o. female presents emergency department from [facility] with generalized weakness and vomiting.  Patient states she had a cough about a week ago.  No shortness of breath.  States she just very weak and tired.  Patient is complaining of right upper quadrant and epigastric pain.  She denies any diarrhea.  States she does have vomiting.  States has been on and off for months but worse over the last 2 days.  She denies fever or chills.  No burning with urination.  Last bowel movement was yesterday and was normal  Past Medical History:  Diagnosis Date   Asthma    Heavy menstrual period    Hypertension     Patient Active Problem List   Diagnosis Date Noted   Malnutrition of moderate degree 02/17/2021   Syncope    Elevated lipase    Electrolyte abnormality    Chest pain 02/15/2021   Hypocalcemia    Hypokalemia    Hypomagnesemia    Alcoholic hepatitis without ascites     Past Surgical History:  Procedure Laterality Date   ABDOMINAL HYSTERECTOMY     partial   FOOT SURGERY     S/P BTL      Prior to Admission medications   Medication Sig Start Date End Date Taking? Authorizing Provider  albuterol (PROVENTIL) (2.5 MG/3ML) 0.083% nebulizer solution Take 2.5 mg by nebulization every 6 (six) hours as needed for wheezing or shortness of breath.    [provider]  amLODipine (NORVASC) 10 MG tablet Take 10 mg by mouth daily.    [provider]  atorvastatin (LIPITOR) 40 MG tablet Take 40 mg by mouth daily.    [provider]  buPROPion (WELLBUTRIN SR) 150 MG 12 hr tablet Take 150 mg by mouth 2 (two) times daily.     [provider]  buPROPion (WELLBUTRIN XL) 150 MG 24 hr tablet Take 300 mg by mouth daily. 01/14/21   [provider]  CALCIUM 600+D 600-400 MG-UNIT tablet Take 1 tablet by mouth 2 (two) times daily. 10/20/20   [provider]  FLOVENT HFA 110 MCG/ACT inhaler Inhale 2 puffs into the lungs 2 (two) times daily. 01/14/21   [provider]  fluticasone (FLONASE) 50 MCG/ACT nasal spray Place 2 sprays into both nostrils daily.    [provider]  hydrochlorothiazide (HYDRODIURIL) 25 MG tablet Take 25 mg by mouth daily. for high blood pressure 09/18/20   [provider]  ibuprofen (ADVIL) 800 MG tablet Take 1 tablet (800 mg total) by mouth every 8 (eight) hours as needed. 10/06/20   Mickie Bail, NP  losartan (COZAAR) 25 MG tablet Take 25 mg by mouth daily.    [provider]  montelukast (SINGULAIR) 10 MG tablet Take 10 mg by mouth at bedtime. 08/07/20   [provider]  omeprazole (PRILOSEC) 40 MG capsule Take 40 mg by mouth daily.    [provider]  PROAIR HFA 108 [id number] Base) MCG/ACT inhaler 2 puff  every four to six hours as needed for wheezing, shortness of breath 10/20/20   [provider]    Allergies Patient has no known allergies.  No family history on file.  Social History Social History   Tobacco Use   Smoking status: Every Day    Packs/day: 0.50    Types: Cigarettes   Smokeless tobacco: Never  Substance Use Topics   Alcohol use: Yes    Alcohol/week: 7.0 standard drinks    Types: 7 Standard drinks or equivalent per week   Drug use: Never    Review of Systems  Constitutional: No fever/chills Eyes: No visual changes. ENT: No sore throat. Respiratory: Denies cough Cardiovascular: Denies chest pain Gastrointestinal: Positive abdominal pain Genitourinary: Negative for dysuria. Musculoskeletal: Negative for back pain. Skin: Negative for rash. Psychiatric: no mood changes,      ____________________________________________   PHYSICAL EXAM:  VITAL SIGNS: ED Triage Vitals  Enc Vitals Group     BP 06/09/21 1311 (S) (!) 85/62     Pulse Rate 06/09/21 1311 (!) 107     Resp 06/09/21 1311 16     Temp 06/09/21 1311 97.7 F (36.5 C)     Temp Source 06/09/21 1311 Oral     SpO2 06/09/21 1311 98 %     Weight 06/09/21 1315 130 lb (59 kg)     Height 06/09/21 1315 5\' 7"  (1.702 m)     Head Circumference --      Peak Flow --      Pain Score 06/09/21 1315 0     Pain Loc --      Pain Edu? --      Excl. in GC? --     Constitutional: Alert and oriented. Well appearing and in no acute distress. Eyes: Conjunctivae are normal.  Head: Atraumatic. Nose: No congestion/rhinnorhea. Mouth/Throat: Mucous membranes are moist.   Neck:  supple no lymphadenopathy noted Cardiovascular: Normal rate, regular rhythm. Heart sounds are normal Respiratory: Normal respiratory effort.  No retractions, lungs c t a  Abd: soft tenderin ruq and epigastric bs normal all 4 quad GU: deferred Musculoskeletal: FROM all extremities, warm and well perfused Neurologic:  Normal speech and language.  Skin:  Skin is warm, dry and intact. No rash noted. Psychiatric: Mood and affect are normal. Speech and behavior are normal.  ____________________________________________   LABS (all labs ordered are listed, but only abnormal results are displayed)  Labs Reviewed  COMPREHENSIVE METABOLIC PANEL - Abnormal; Notable for the following components:      Result Value   Sodium 130 (*)    Potassium 3.1 (*)    Chloride 85 (*)    BUN 23 (*)    Creatinine, Ser 1.05 (*)    Calcium 7.5 (*)    Total Protein 5.7 (*)    Albumin 2.6 (*)    AST 53 (*)    Alkaline Phosphatase 184 (*)    Total Bilirubin 2.3 (*)    Anion gap 17 (*)    All other components within normal limits  LIPASE, BLOOD - Abnormal; Notable for the following components:   Lipase 78 (*)    All other components within normal limits   CBC WITH DIFFERENTIAL/PLATELET - Abnormal; Notable for the following components:   RBC 1.71 (*)    Hemoglobin 4.7 (*)    HCT 14.2 (*)    RDW 21.8 (*)    nRBC 0.6 (*)    Lymphs Abs 0.6 (*)    All other components within normal limits  RESP PANEL BY RT-PCR (FLU A&B, COVID) ARPGX2  LACTIC ACID, PLASMA  LACTIC ACID, PLASMA  URINALYSIS, COMPLETE (UACMP) WITH MICROSCOPIC  MAGNESIUM  PREPARE  RBC (CROSSMATCH)  ABO/RH  TYPE AND SCREEN  PREPARE RBC (CROSSMATCH)   ____________________________________________   ____________________________________________  RADIOLOGY  Cxr [place] ruq Ct abd/pelvis  ____________________________________________   PROCEDURES  Procedure(s) performed: ekg  Procedures    ____________________________________________   INITIAL IMPRESSION / ASSESSMENT AND PLAN / ED COURSE  Pertinent labs & imaging results that were available during my care of the patient were reviewed by me and considered in my medical decision making (see chart for details).   Patient is a 60 year old female presents from [facility] with weakness.  See HPI.  Physical exam shows patient to be tachycardic with decreased blood pressure, epigastric area is tender to palpation  DDx: sepsis, acute cholecystitis, acute pancreatitis, acute gastroenteritis, covid, gi bleed  Cbc has h&h of 4.7/14.2;  lipase 78, comprehensive metabolic panel shows low K+, elevated liver enzymes but they have improved since the patient's last labs  Ekg shows afib/flutter, see physician read  Pt was evaluated by DR Roxan Hockey, told him she has melena for 2 weeks, dr Roxan Hockey ordered transfusion   Dr. Roxan Hockey consulted with GI.  Plan at this time is to admit the patient.  Dr. Roxan Hockey would prefer the patient go to the unit if her blood pressure remains low.  Called intensivist as patient's blood pressure dropped to 80/60  Jamie Wilkins was evaluated in Emergency Department on 06/09/2021 for the  symptoms described in the history of present illness. She was evaluated in the context of the global COVID-19 pandemic, which necessitated consideration that the patient might be at risk for infection with the SARS-CoV-2 virus that causes COVID-19. Institutional protocols and algorithms that pertain to the evaluation of patients at risk for COVID-19 are in a state of rapid change based on information released by regulatory bodies including the CDC and federal and state organizations. These policies and algorithms were followed during the patient's care in the ED.    As part of my medical decision making, I reviewed the following data within the electronic MEDICAL RECORD NUMBER Nursing notes reviewed and incorporated, Labs reviewed , EKG interpreted atrial fibrillation, Old chart reviewed, Radiograph reviewed , Discussed with admitting physician , A consult was requested and obtained from this/these consultant(s) Gastroenterology, Evaluated by EM attending Dr. Roxan Hockey, Notes from prior ED visits, and [place] Controlled Substance Database  ____________________________________________   FINAL CLINICAL IMPRESSION(S) / ED DIAGNOSES  Final diagnoses:  RUQ pain  Acute upper GI bleed  Weakness      NEW MEDICATIONS STARTED DURING THIS VISIT:  New Prescriptions   No medications on file     Note:  This document was prepared using Dragon voice recognition software and may include unintentional dictation errors.    Faythe Ghee, PA-C 06/09/21 1619    Willy Eddy, MD 06/10/21 [phone]

## 2021-06-09 NOTE — ED Notes (Signed)
notified Antoine Primas, MD that pt's potassium infusions were being held to prioritize blood and protonix infusions.

## 2021-06-09 NOTE — H&P (Signed)
History and Physical   Jamie Wilkins:[id number] DOB: 1961/08/07 DOA: 06/09/2021  Referring MD/NP/PA: Dr Antoine Primas  PCP: Veneda Melter, FNP   Outpatient Specialists: None   Patient coming from: Home  Chief Complaint: Weakness  HPI: Jamie Wilkins is a 60 y.o. female with medical history significant of alcohol abuse, essential hypertension, asthma, suspected liver cirrhosis who went to the [facility] today with complaint of weakness and vomiting.  Also reported right upper quadrant epigastric and abdominal pain.  Symptoms have been on and off for the last 3 months.  In the last 3 days they have gotten worse.  She has been feeling weak.  Short of breath with little activity.  She has felt occasionally like she was going to pass out.  She denied bright red blood per rectum but has noticed tarry stools on and off over the last 3 months.  Patient was seen in April of this year with hemoglobin about 10.2.  Today in the ER her hemoglobin is down to 4.7.  She has no diarrhea.  Patient has hypokalemia hyponatremia mildly elevated LFTs.  No obvious active bleed at the moment but patient has symptomatic anemia from suspected GI bleed.  She is therefore being admitted to the hospital for further evaluation and treatment..  ED Course: Temperature 98.3 blood pressure 85/62, pulse 107, respiratory rate of 18 and oxygen sat 95% on room air.  White count 4.4 hemoglobin is 4.7, platelet count of 338.  Sodium 130 potassium 3.1 chloride 85.  BUN is 23 creatinine 1.05.  Gap of 17.  ALT 21 alkaline phosphatase 184 albumin 2.6 lipase of 78 and AST 53.  Lactic acid 1.6.  Abdominal ultrasound showed hepatic steatosis with gallbladder sludge.  Chest x-ray showed no acute findings.  CT abdomen pelvis showed marked hepatic steatosis with limited focal hepatic lesion.  Patient will be admitted therefore for evaluation of symptomatic anemia.  Review of Systems: As per HPI otherwise 10 point review of  systems negative.    Past Medical History:  Diagnosis Date   Asthma    Heavy menstrual period    Hypertension     Past Surgical History:  Procedure Laterality Date   ABDOMINAL HYSTERECTOMY     partial   FOOT SURGERY     S/P BTL       reports that she has been smoking cigarettes. She has been smoking an average of .5 packs per day. She has never used smokeless tobacco. She reports current alcohol use of about 7.0 standard drinks of alcohol per week. She reports that she does not use drugs.  No Known Allergies  No family history on file.   Prior to Admission medications   Medication Sig Start Date End Date Taking? Authorizing Provider  albuterol (PROVENTIL) (2.5 MG/3ML) 0.083% nebulizer solution Take 2.5 mg by nebulization every 6 (six) hours as needed for wheezing or shortness of breath.    [provider]  amLODipine (NORVASC) 10 MG tablet Take 10 mg by mouth daily.    [provider]  atorvastatin (LIPITOR) 40 MG tablet Take 40 mg by mouth daily.    [provider]  buPROPion (WELLBUTRIN SR) 150 MG 12 hr tablet Take 150 mg by mouth 2 (two) times daily.    [provider]  buPROPion (WELLBUTRIN XL) 150 MG 24 hr tablet Take 300 mg by mouth daily. 01/14/21   [provider]  CALCIUM 600+D 600-400 MG-UNIT tablet Take 1 tablet by mouth 2 (two)  times daily. 10/20/20   [provider]  FLOVENT HFA 110 MCG/ACT inhaler Inhale 2 puffs into the lungs 2 (two) times daily. 01/14/21   [provider]  fluticasone (FLONASE) 50 MCG/ACT nasal spray Place 2 sprays into both nostrils daily.    [provider]  hydrochlorothiazide (HYDRODIURIL) 25 MG tablet Take 25 mg by mouth daily. for high blood pressure 09/18/20   [provider]  ibuprofen (ADVIL) 800 MG tablet Take 1 tablet (800 mg total) by mouth every 8 (eight) hours as needed. 10/06/20   Mickie Bail, NP  losartan (COZAAR) 25 MG tablet Take 25 mg by mouth  daily.    [provider]  montelukast (SINGULAIR) 10 MG tablet Take 10 mg by mouth at bedtime. 08/07/20   [provider]  omeprazole (PRILOSEC) 40 MG capsule Take 40 mg by mouth daily.    [provider]  PROAIR HFA 108 [phone] Base) MCG/ACT inhaler 2 puff  every four to six hours as needed for wheezing, shortness of breath 10/20/20   [provider]    Physical Exam: Vitals:   06/09/21 1700 06/09/21 1730 06/09/21 1740 06/09/21 1755  BP: 94/78 100/74 100/74 96/63  Pulse:   95 98  Resp: 12 11 15 16   Temp:  97.9 F (36.6 C) 97.9 F (36.6 C) 97.6 F (36.4 C)  TempSrc:  Oral Oral Oral  SpO2:  98% 100% 98%  Weight:      Height:          Constitutional: Acutely ill looking with no distress Vitals:   06/09/21 1700 06/09/21 1730 06/09/21 1740 06/09/21 1755  BP: 94/78 100/74 100/74 96/63  Pulse:   95 98  Resp: 12 11 15 16   Temp:  97.9 F (36.6 C) 97.9 F (36.6 C) 97.6 F (36.4 C)  TempSrc:  Oral Oral Oral  SpO2:  98% 100% 98%  Weight:      Height:       Eyes: PERRL, lids and conjunctivae pale ENMT: Mucous membranes are moist. Posterior pharynx clear of any exudate or lesions.Normal dentition.  Neck: normal, supple, no masses, no thyromegaly Respiratory: clear to auscultation bilaterally, no wheezing, no crackles. Normal respiratory effort. No accessory muscle use.  Cardiovascular: Sinus tachycardia, no murmurs / rubs / gallops. No extremity edema. 2+ pedal pulses. No carotid bruits.  Abdomen: no tenderness, no masses palpated. No hepatosplenomegaly. Bowel sounds positive.  Musculoskeletal: no clubbing / cyanosis. No joint deformity upper and lower extremities. Good ROM, no contractures. Normal muscle tone.  Skin: no rashes, lesions, ulcers. No induration Neurologic: CN 2-12 grossly intact. Sensation intact, DTR normal. Strength 5/5 in all 4.  Psychiatric: Normal judgment and insight. Alert and oriented x 3.  Depressed mood.     Labs on  Admission: I have personally reviewed following labs and imaging studies  CBC: Recent Labs  Lab 06/09/21 1343  WBC 4.7  NEUTROABS 3.7  HGB 4.7*  HCT 14.2*  MCV 83.0  PLT 338   Basic Metabolic Panel: Recent Labs  Lab 06/09/21 1343  NA 130*  K 3.1*  CL 85*  CO2 28  GLUCOSE 98  BUN 23*  CREATININE 1.05*  CALCIUM 7.5*   GFR: Estimated Creatinine Clearance: 53.1 mL/min (A) (by C-G formula based on SCr of 1.05 mg/dL (H)). Liver Function Tests: Recent Labs  Lab 06/09/21 1343  AST 53*  ALT 21  ALKPHOS 184*  BILITOT 2.3*  PROT 5.7*  ALBUMIN 2.6*   Recent Labs  Lab  06/09/21 1343  LIPASE 78*   No results for input(s): AMMONIA in the last 168 hours. Coagulation Profile: No results for input(s): INR, PROTIME in the last 168 hours. Cardiac Enzymes: No results for input(s): CKTOTAL, CKMB, CKMBINDEX, TROPONINI in the last 168 hours. BNP (last 3 results) No results for input(s): PROBNP in the last 8760 hours. HbA1C: No results for input(s): HGBA1C in the last 72 hours. CBG: No results for input(s): GLUCAP in the last 168 hours. Lipid Profile: No results for input(s): CHOL, HDL, LDLCALC, TRIG, CHOLHDL, LDLDIRECT in the last 72 hours. Thyroid Function Tests: No results for input(s): TSH, T4TOTAL, FREET4, T3FREE, THYROIDAB in the last 72 hours. Anemia Panel: No results for input(s): VITAMINB12, FOLATE, FERRITIN, TIBC, IRON, RETICCTPCT in the last 72 hours. Urine analysis: No results found for: COLORURINE, APPEARANCEUR, LABSPEC, PHURINE, GLUCOSEU, HGBUR, BILIRUBINUR, KETONESUR, PROTEINUR, UROBILINOGEN, NITRITE, LEUKOCYTESUR Sepsis Labs: @LABRCNTIP (procalcitonin:4,lacticidven:4) ) Recent Results (from the past 240 hour(s))  Resp Panel by RT-PCR (Flu A&B, Covid) Nasopharyngeal Swab     Status: None   Collection Time: 06/09/21  1:43 PM   Specimen: Nasopharyngeal Swab; Nasopharyngeal(NP) swabs in vial transport medium  Result Value Ref Range Status   SARS Coronavirus 2  by RT PCR NEGATIVE NEGATIVE Final    Comment: (NOTE) SARS-CoV-2 target nucleic acids are NOT DETECTED.  The SARS-CoV-2 RNA is generally detectable in upper respiratory specimens during the acute phase of infection. The lowest concentration of SARS-CoV-2 viral copies this assay can detect is 138 copies/mL. A negative result does not preclude SARS-Cov-2 infection and should not be used as the sole basis for treatment or other patient management decisions. A negative result may occur with  improper specimen collection/handling, submission of specimen other than nasopharyngeal swab, presence of viral mutation(s) within the areas targeted by this assay, and inadequate number of viral copies(<138 copies/mL). A negative result must be combined with clinical observations, patient history, and epidemiological information. The expected result is Negative.  Fact Sheet for Patients:  06/11/21  Fact Sheet for Healthcare Providers:  BloggerCourse.com  This test is no t yet approved or cleared by the [employer] FDA and  has been authorized for detection and/or diagnosis of SARS-CoV-2 by FDA under an Emergency Use Authorization (EUA). This EUA will remain  in effect (meaning this test can be used) for the duration of the COVID-19 declaration under Section 564(b)(1) of the Act, 21 U.S.C.section 360bbb-3(b)(1), unless the authorization is terminated  or revoked sooner.       Influenza A by PCR NEGATIVE NEGATIVE Final   Influenza B by PCR NEGATIVE NEGATIVE Final    Comment: (NOTE) The Xpert Xpress SARS-CoV-2/FLU/RSV plus assay is intended as an aid in the diagnosis of influenza from Nasopharyngeal swab specimens and should not be used as a sole basis for treatment. Nasal washings and aspirates are unacceptable for Xpert Xpress SARS-CoV-2/FLU/RSV testing.  Fact Sheet for Patients: [place]  Fact  Sheet for Healthcare Providers: BloggerCourse.com  This test is not yet approved or cleared by the [employer].[place] FDA and has been authorized for detection and/or diagnosis of SARS-CoV-2 by FDA under an Emergency Use Authorization (EUA). This EUA will remain in effect (meaning this test can be used) for the duration of the COVID-19 declaration under Section 564(b)(1) of the Act, 21 U.S.C. section 360bbb-3(b)(1), unless the authorization is terminated or revoked.  Performed at [facility], [address], [place], [place]      Radiological Exams on Admission: CT ABDOMEN PELVIS W CONTRAST  Result Date: 06/09/2021 CLINICAL DATA:  Nonlocalized acute abdominal pain. Vomiting X 1 month. Shortness of breath on exertion X 2 days. EXAM: CT ABDOMEN AND PELVIS WITH CONTRAST TECHNIQUE: Multidetector CT imaging of the abdomen and pelvis was performed using the standard protocol following bolus administration of intravenous contrast. CONTRAST:  52mL OMNIPAQUE IOHEXOL 300 MG/ML  SOLN COMPARISON:  Ultrasound abdomen 06/09/2021, CT abdomen pelvis 02/15/2021 FINDINGS: Lower chest: No acute abnormality. Hepatobiliary: The hepatic parenchyma is markedly diffusely hypodense compared to the splenic parenchyma consistent with fatty infiltration. No definite focal liver abnormality. Vague layering hyperdensity within the gallbladder lumen consistent with gallbladder sludge. No biliary dilatation. Pancreas: No focal lesion. Normal pancreatic contour. No surrounding inflammatory changes. No main pancreatic ductal dilatation. Spleen: Similar-appearing subcentimeter hypodensity within the spleen is nonspecific (2:16). Otherwise normal in size without focal abnormality. Adrenals/Urinary Tract: No adrenal nodule bilaterally. Bilateral kidneys enhance symmetrically. Subcentimeter hypodensities are too small to characterize. Possible punctate calcified stone within the left kidney  (5:42). No hydronephrosis. No hydroureter. The urinary bladder is unremarkable. Stomach/Bowel: Stomach is within normal limits. No evidence of bowel wall thickening or dilatation. Appendix appears normal. Vascular/Lymphatic: No abdominal aorta or iliac aneurysm. Severe calcified and noncalcified atherosclerotic plaque of the aorta and its branches. No abdominal, pelvic, or inguinal lymphadenopathy. Reproductive: Status post hysterectomy.  No adnexal mass identified. Other: No intraperitoneal free fluid. No intraperitoneal free gas. No organized fluid collection. Musculoskeletal: No abdominal wall hernia or abnormality. No suspicious lytic or blastic osseous lesions. No acute displaced fracture. Multilevel degenerative changes of the spine. IMPRESSION: 1. Marked hepatic steatosis with limited evaluation for focal hepatic lesion. 2. Gallbladder sludge. No findings suggest acute cholecystitis or choledocholithiasis. 3.  Aortic Atherosclerosis (ICD10-I70.0). Electronically Signed   By: Tish Frederickson M.D.   On: 06/09/2021 15:18   DG Chest Portable 1 View  Result Date: 06/09/2021 CLINICAL DATA:  Weakness and shortness of breath on exertion for 2 days. EXAM: PORTABLE CHEST 1 VIEW COMPARISON:  CT chest 02/15/2021.  PA and lateral chest 02/14/2021. FINDINGS: The lungs are emphysematous but clear. Heart size is normal. Aortic atherosclerosis noted. No pneumothorax or pleural fluid. No acute or focal bony abnormality. IMPRESSION: No acute disease. Aortic Atherosclerosis (ICD10-I70.0) and Emphysema (ICD10-J43.9). Electronically Signed   By: Drusilla Kanner M.D.   On: 06/09/2021 14:14   US Abdomen Limited RUQ (LIVER/GB)  Result Date: 06/09/2021 CLINICAL DATA:  Right upper quadrant pain for the last month. EXAM: ULTRASOUND ABDOMEN LIMITED RIGHT UPPER QUADRANT COMPARISON:  Right upper quadrant ultrasound dated February 15, 2021. FINDINGS: Gallbladder: Unchanged prominent sludge. No gallstones or wall thickening visualized.  No sonographic Murphy sign noted by sonographer. Common bile duct: Diameter: 3 mm, normal. Liver: No focal lesion identified. Unchanged diffusely increased in parenchymal echogenicity. Portal vein is patent on color Doppler imaging with normal direction of blood flow towards the liver. Other: None. IMPRESSION: 1. No acute abnormality. 2. Unchanged gallbladder sludge and hepatic steatosis. Electronically Signed   By: Obie Dredge M.D.   On: 06/09/2021 14:29     Assessment/Plan Principal Problem:   Symptomatic anemia Active Problems:   Hypokalemia   Hypomagnesemia   Alcoholic hepatitis without ascites   Electrolyte abnormality   Malnutrition of moderate degree   Hyponatremia     #1 symptomatic anemia: Suspected upper GI bleed.  Patient will be admitted for evaluation and treatment.  GI consulted.  IV Protonix.  Serial H&H.  Transfusing 3 units of packed red blood cells.  We will follow H&H closely.  Counseling provided.  Keep n.p.o.  #2 history of alcoholism: No evidence of cirrhosis on ultrasound or CT.  Probably alcoholic hepatitis at this point.  We will watch patient closely.  Denies active alcohol intake at the moment.  Watch for signs of DT.  #3 malnutrition: Once oral intake Ensures, we will get nutrition consult  #4 hypokalemia: Replete potassium  #5 hyponatremia: Gentle hydration with saline and monitor sodium level  DVT prophylaxis: SCD Code Status: Full code Family Communication: No family at bedside Disposition Plan: Home Consults called: None Admission status: Inpatient  Severity of Illness: The appropriate patient status for this patient is INPATIENT. Inpatient status is judged to be reasonable and necessary in order to provide the required intensity of service to ensure the patient's safety. The patient's presenting symptoms, physical exam findings, and initial radiographic and laboratory data in the context of their chronic comorbidities is felt to place them at  high risk for further clinical deterioration. Furthermore, it is not anticipated that the patient will be medically stable for discharge from the hospital within 2 midnights of admission. The following factors support the patient status of inpatient.   " The patient's presenting symptoms include generalized weakness with shortness of breath. " The worrisome physical exam findings include sinus tachycardia with conjunctival pallor. " The initial radiographic and laboratory data are worrisome because of hemoglobin of 4.7. " The chronic co-morbidities include alcoholism.   * I certify that at the point of admission it is my clinical judgment that the patient will require inpatient hospital care spanning beyond 2 midnights from the point of admission due to high intensity of service, high risk for further deterioration and high frequency of surveillance required.Lonia Blood*   Pati Thinnes,LAWAL MD [facility] Pager [phone]  If 7PM-7AM, please contact night-coverage [email or web address] Password [email or web address]  06/09/2021, 8:14 PM

## 2021-06-09 NOTE — ED Triage Notes (Signed)
Pt BIB EMS from [facility] seen there for generalized weakness and SOB on exertion x 2 days, also reports cough x 1 week. On EMS arrival, SBP=76/44, HR=100, B/S 142, Sinus Tach with PAC. NS 500cc given, BP 91/61. Pt reports vomiting x 1 month.

## 2021-06-10 ENCOUNTER — Inpatient Hospital Stay: Payer: Self-pay | Admitting: Anesthesiology

## 2021-06-10 ENCOUNTER — Encounter
Admission: EM | Disposition: A | Discharge status: Home / Self Care | Payer: Self-pay | Source: Home / Self Care | Attending: Obstetrics and Gynecology

## 2021-06-10 ENCOUNTER — Encounter: Payer: Self-pay | Admitting: Internal Medicine

## 2021-06-10 DIAGNOSIS — D649 Anemia, unspecified: Secondary | ICD-10-CM

## 2021-06-10 DIAGNOSIS — I480 Paroxysmal atrial fibrillation: Secondary | ICD-10-CM

## 2021-06-10 DIAGNOSIS — E43 Unspecified severe protein-calorie malnutrition: Secondary | ICD-10-CM | POA: Insufficient documentation

## 2021-06-10 HISTORY — PX: ESOPHAGOGASTRODUODENOSCOPY: SHX5428

## 2021-06-10 LAB — COMPREHENSIVE METABOLIC PANEL
ALT: 18 U/L (ref 0–44)
AST: 43 U/L — ABNORMAL HIGH (ref 15–41)
Albumin: 2.3 g/dL — ABNORMAL LOW (ref 3.5–5.0)
Alkaline Phosphatase: 162 U/L — ABNORMAL HIGH (ref 38–126)
Anion gap: 16 — ABNORMAL HIGH (ref 5–15)
BUN: 20 mg/dL (ref 6–20)
CO2: 25 mmol/L (ref 22–32)
Calcium: 7 mg/dL — ABNORMAL LOW (ref 8.9–10.3)
Chloride: 94 mmol/L — ABNORMAL LOW (ref 98–111)
Creatinine, Ser: 0.88 mg/dL (ref 0.44–1.00)
GFR, Estimated: >60 mL/min (ref >60)
Glucose, Bld: 80 mg/dL (ref 70–99)
Potassium: 3.8 mmol/L (ref 3.5–5.1)
Sodium: 135 mmol/L (ref 135–145)
Total Bilirubin: 3.5 mg/dL — ABNORMAL HIGH (ref 0.3–1.2)
Total Protein: 5.1 g/dL — ABNORMAL LOW (ref 6.5–8.1)

## 2021-06-10 LAB — CBC
HCT: 25 % — ABNORMAL LOW (ref 36.0–46.0)
Hemoglobin: 8.7 g/dL — ABNORMAL LOW (ref 12.0–15.0)
MCH: 27.6 pg (ref 26.0–34.0)
MCHC: 34.8 g/dL (ref 30.0–36.0)
MCV: 79.4 fL — ABNORMAL LOW (ref 80.0–100.0)
Platelets: 220 10*3/uL (ref 150–400)
RBC: 3.15 MIL/uL — ABNORMAL LOW (ref 3.87–5.11)
RDW: 20 % — ABNORMAL HIGH (ref 11.5–15.5)
WBC: 4.6 10*3/uL (ref 4.0–10.5)
nRBC: 0 % (ref 0.0–0.2)

## 2021-06-10 LAB — MRSA NEXT GEN BY PCR, NASAL: MRSA by PCR Next Gen: NOT DETECTED

## 2021-06-10 LAB — HEMOGLOBIN: Hemoglobin: 8.9 g/dL — ABNORMAL LOW (ref 12.0–15.0)

## 2021-06-10 SURGERY — EGD (ESOPHAGOGASTRODUODENOSCOPY)
Anesthesia: General

## 2021-06-10 MED ORDER — POTASSIUM CHLORIDE 10 MEQ/100ML IV SOLN
10.0000 meq | INTRAVENOUS | Status: AC
Start: 2021-06-10 — End: 2021-06-10
  Administered 2021-06-10 (×5): 10 meq via INTRAVENOUS
  Filled 2021-06-10 (×5): qty 100

## 2021-06-10 MED ORDER — SODIUM CHLORIDE 0.9 % IV SOLN: INTRAVENOUS | Status: DC

## 2021-06-10 MED ORDER — MAGNESIUM SULFATE 2 GM/50ML IV SOLN
2.0000 g | Freq: Once | INTRAVENOUS | Status: AC
  Administered 2021-06-10: 2 g via INTRAVENOUS
  Filled 2021-06-10: qty 50

## 2021-06-10 MED ORDER — PROPOFOL 10 MG/ML IV BOLUS
INTRAVENOUS | Status: DC | PRN
  Administered 2021-06-10: 60 mg via INTRAVENOUS

## 2021-06-10 NOTE — H&P (View-Only) (Signed)
[facility] Brief Progress Note   Patient seen and examined this afternoon resting comfortably in hospital bed. No acute events overnight. No gross hematochezia or melena. She has been transfused 3 units pRBCs with improvements to 8.7 this morning. Her electrolyte derangement has resolved. She did have some decreased O2 sats overnight which have resolved and not recurred, possibly related to sleep apnea. Blood pressures have been stable and have not required pressors. Discussed plan of care with Dr. Toledo and Dr. Wouk and we will plan on EGD this afternoon with Dr. Toledo for indications of melena, profound anemia, and epigastric abdominal pain. If EGD is negative, will likely need colonoscopy tomorrow. See procedure note for findings and further recommendations.   I reviewed the risks (including bleeding, perforation, infection, anesthesia complications, cardiac/respiratory complications), benefits and alternatives of EGD. Patient consents to proceed.   Mason Kanden Carey, PA-C [facility]  

## 2021-06-10 NOTE — Op Note (Signed)
[facility] Patient Name: Jamie Wilkins Procedure Date: 06/10/2021 2:04 PM MRN: [id number] Account #: [id number] Date of Birth: February 07, 1961 Admit Type: Inpatient Age: 60 Room: [facility] ENDO ROOM 2 Gender: Female Note Status: Finalized Procedure:             Upper GI endoscopy Indications:           Acute post hemorrhagic anemia, Melena Providers:             Boykin Nearing. Norma Fredrickson MD, MD Referring MD:          Valentina Shaggy. White (Referring MD) Medicines:             Propofol per Anesthesia Complications:         No immediate complications. Procedure:             Pre-Anesthesia Assessment:                        - The risks and benefits of the procedure and the                         sedation options and risks were discussed with the                         patient. All questions were answered and informed                         consent was obtained.                        - Patient identification and proposed procedure were                         verified prior to the procedure by the nurse. The                         procedure was verified in the procedure room.                        - ASA Grade Assessment: III - A patient with severe                         systemic disease.                        - After reviewing the risks and benefits, the patient                         was deemed in satisfactory condition to undergo the                         procedure.                        After obtaining informed consent, the endoscope was                         passed under direct vision. Throughout the procedure,                         the patient's blood  pressure, pulse, and oxygen                         saturations were monitored continuously. The Endoscope                         was introduced through the mouth, and advanced to the                         third part of duodenum. The upper GI endoscopy was                         accomplished  without difficulty. The patient tolerated                         the procedure well. Findings:      The esophagus was normal.      A 1 cm hiatal hernia was present.      The examined duodenum was normal.      Normal mucosa was found in the entire examined stomach. Impression:            - Normal esophagus.                        - 1 cm hiatal hernia.                        - Normal examined duodenum.                        - Normal mucosa was found in the entire stomach.                        - No specimens collected. Recommendation:        - Return patient to hospital ward for ongoing care.                        - Perform a colonoscopy tomorrow. Procedure Code(s):     --- Professional ---                        [phone], Esophagogastroduodenoscopy, flexible,                         transoral; diagnostic, including collection of                         specimen(s) by brushing or washing, when performed                         (separate procedure) Diagnosis Code(s):     --- Professional ---                        K92.1, Melena (includes Hematochezia)                        D62, Acute posthemorrhagic anemia                        K44.9, Diaphragmatic hernia without obstruction or  gangrene CPT copyright 2019 American Medical Association. All rights reserved. The codes documented in this report are preliminary and upon coder review may  be revised to meet current compliance requirements. Stanton Kidney MD, MD 06/10/2021 2:18:28 PM This report has been signed electronically. Number of Addenda: 0 Note Initiated On: 06/10/2021 2:04 PM Estimated Blood Loss:  Estimated blood loss: none.      [facility]

## 2021-06-10 NOTE — Anesthesia Preprocedure Evaluation (Addendum)
Anesthesia Evaluation  Patient identified by MRN, date of birth, ID band Patient awake    Reviewed: Allergy & Precautions, NPO status , Patient's Chart, lab work & pertinent test results  Airway Mallampati: II  TM Distance: >3 FB Neck ROM: Full    Dental  (+) Poor Dentition, Loose, Missing   Pulmonary asthma , Current Smoker and Patient abstained from smoking.,    Pulmonary exam normal        Cardiovascular hypertension, Pt. on medications negative cardio ROS Normal cardiovascular exam     Neuro/Psych negative neurological ROS  negative psych ROS   GI/Hepatic negative GI ROS, (+) Cirrhosis     substance abuse  alcohol use, Hepatitis -  Endo/Other  negative endocrine ROS  Renal/GU negative Renal ROS  negative genitourinary   Musculoskeletal negative musculoskeletal ROS (+)   Abdominal   Peds negative pediatric ROS (+)  Hematology negative hematology ROS (+) anemia ,   Anesthesia Other Findings alcohol abuse essential hypertension Asthma suspected liver cirrhosis   Reproductive/Obstetrics negative OB ROS                           Anesthesia Physical Anesthesia Plan  ASA: 3  Anesthesia Plan: General   Post-op Pain Management:    Induction: Intravenous  PONV Risk Score and Plan: 2 and Propofol infusion and TIVA  Airway Management Planned: Natural Airway and Nasal Cannula  Additional Equipment:   Intra-op Plan:   Post-operative Plan:   Informed Consent: I have reviewed the patients History and Physical, chart, labs and discussed the procedure including the risks, benefits and alternatives for the proposed anesthesia with the patient or authorized representative who has indicated his/her understanding and acceptance.       Plan Discussed with: CRNA, Anesthesiologist and Surgeon  Anesthesia Plan Comments:         Anesthesia Quick Evaluation

## 2021-06-10 NOTE — Interval H&P Note (Signed)
History and Physical Interval Note:  06/10/2021 2:08 PM  Jamie Wilkins  has presented today for surgery, with the diagnosis of Melena, profound anemia.  The various methods of treatment have been discussed with the patient and family. After consideration of risks, benefits and other options for treatment, the patient has consented to  Procedure(s): ESOPHAGOGASTRODUODENOSCOPY (EGD) (N/A) as a surgical intervention.  The patient's history has been reviewed, patient examined, no change in status, stable for surgery.  I have reviewed the patient's chart and labs.  Questions were answered to the patient's satisfaction.     [place], [place]

## 2021-06-10 NOTE — Anesthesia Postprocedure Evaluation (Signed)
Anesthesia Post Note  Patient: Jamie Wilkins  Procedure(s) Performed: ESOPHAGOGASTRODUODENOSCOPY (EGD)  Patient location during evaluation: Phase II Anesthesia Type: General Level of consciousness: awake and alert, awake and oriented Pain management: pain level controlled Vital Signs Assessment: post-procedure vital signs reviewed and stable Respiratory status: spontaneous breathing, nonlabored ventilation and respiratory function stable Cardiovascular status: blood pressure returned to baseline and stable Postop Assessment: no apparent nausea or vomiting Anesthetic complications: no   No notable events documented.   Last Vitals:  Vitals:   06/10/21 1438 06/10/21 1448  BP: 100/72 114/80  Pulse: 67   Resp: 17   Temp:    SpO2: 100%     Last Pain:  Vitals:   06/10/21 1438  TempSrc:   PainSc: 0-No pain                 Manfred Arch

## 2021-06-10 NOTE — Transfer of Care (Signed)
Immediate Anesthesia Transfer of Care Note  Patient: Jamie Wilkins  Procedure(s) Performed: ESOPHAGOGASTRODUODENOSCOPY (EGD)  Patient Location: PACU  Anesthesia Type:General  Level of Consciousness: awake, alert  and oriented  Airway & Oxygen Therapy: Patient Spontanous Breathing  Post-op Assessment: Report given to RN and Post -op Vital signs reviewed and stable  Post vital signs: Reviewed and stable  Last Vitals:  Vitals Value Taken Time  BP 102/71 06/10/21 1418  Temp 36.6 C 06/10/21 1418  Pulse 70 06/10/21 1419  Resp 23 06/10/21 1419  SpO2 98 % 06/10/21 1419  Vitals shown include unvalidated device data.  Last Pain:  Vitals:   06/10/21 1418  TempSrc: Temporal  PainSc:       Patients Stated Pain Goal: 0 (06/09/21 1920)  Complications: No notable events documented.

## 2021-06-10 NOTE — Progress Notes (Signed)
Attempt to call report, Primary nurse. Tamika,RN will call back per secretary.

## 2021-06-10 NOTE — Progress Notes (Signed)
Initial Nutrition Assessment  DOCUMENTATION CODES:   Severe malnutrition in context of chronic illness  INTERVENTION:   RD will add supplements once pt's diet is advanced  Pt at high refeed risk; recommend monitor potassium, magnesium and phosphorus labs daily until stable  NUTRITION DIAGNOSIS:   Severe Malnutrition related to chronic illness (poor oral intake, etoh abuse) as evidenced by 20 percent weight loss in 6-7 months, moderate fat depletion, moderate muscle depletion, severe muscle depletion.  GOAL:   Patient will meet greater than or equal to 90% of their needs  MONITOR:   Diet advancement, Labs, Weight trends, Skin, I & O's  REASON FOR ASSESSMENT:   Malnutrition Screening Tool    ASSESSMENT:   60 y.o. female with medical history significant of alcohol abuse, essential hypertension, asthma and suspected liver cirrhosis who is admitted with nausea and GIB.  Met with pt in room today. Pt reports poor appetite and oral intake for several months pta r/t nausea and early satiety. Pt reports a 30lb recent weight loss. Per chart, pt is down 29lbs(20%) over the past 6-7 months; this is significant weight loss. Pt reports that [name]. Pt has also been drinking a lot of V8. RD discussed with pt the importance of protein intake needed to preserve lean muscle. Pt does not like Ensure but she does enjoy [name]. Pt currently NPO for EGD today. Pt may require colonoscopy tomorrow. RD will add supplements once pt's diet is advanced. Pt is at high refeed risk.   Medications reviewed and include: NaCl _0 /hr, protonix  Labs reviewed: K 3.8 wnl, Mg 0.9(L) Hgb 8.7(L), Hct 25.0(L)  NUTRITION - FOCUSED PHYSICAL EXAM:  Flowsheet Row Most Recent Value  Orbital Region No depletion  Upper Arm Region Moderate depletion  Thoracic and Lumbar Region Moderate depletion  Buccal Region Mild depletion  Temple Region Mild  depletion  Clavicle Bone Region Moderate depletion  Clavicle and Acromion Bone Region Moderate depletion  Scapular Bone Region Moderate depletion  Dorsal Hand Severe depletion  Patellar Region Severe depletion  Anterior Thigh Region Severe depletion  Posterior Calf Region Severe depletion  Edema (RD Assessment) None  Hair Reviewed  Eyes Reviewed  Mouth Reviewed  Skin Reviewed  Nails Reviewed   Diet Order:   Diet Order             Diet NPO time specified  Diet effective now                  EDUCATION NEEDS:   Education needs have been addressed  Skin:  Skin Assessment: Reviewed RN Assessment  Last BM:  PTA  Height:   Ht Readings from Last 1 Encounters:  06/10/21 5' 7" (1.702 m)    Weight:   Wt Readings from Last 1 Encounters:  06/10/21 59 kg    Ideal Body Weight:  61.3 kg  BMI:  Body mass index is 20.36 kg/m.  Estimated Nutritional Needs:   Kcal:  1500-1700kcal/day  Protein:  75-85g/day  Fluid:  1.6-1.9L/day  Jamie Distance MS, RD, LDN Please refer to [facility] for RD and/or RD on-call/weekend/after hours pager

## 2021-06-10 NOTE — Progress Notes (Signed)
[facility] Brief Progress Note   Patient seen and examined this afternoon resting comfortably in hospital bed. No acute events overnight. No gross hematochezia or melena. She has been transfused 3 units pRBCs with improvements to 8.7 this morning. Her electrolyte derangement has resolved. She did have some decreased O2 sats overnight which have resolved and not recurred, possibly related to sleep apnea. Blood pressures have been stable and have not required pressors. Discussed plan of care with Dr. Norma Fredrickson and Dr. Ashok Pall and we will plan on EGD this afternoon with Dr. Norma Fredrickson for indications of melena, profound anemia, and epigastric abdominal pain. If EGD is negative, will likely need colonoscopy tomorrow. See procedure note for findings and further recommendations.   I reviewed the risks (including bleeding, perforation, infection, anesthesia complications, cardiac/respiratory complications), benefits and alternatives of EGD. Patient consents to proceed.   Jacob Moores, PA-C [facility]

## 2021-06-10 NOTE — Progress Notes (Signed)
PROGRESS NOTE    Jamie Wilkins  IRS:[id number] DOB: 09-13-1961 DOA: 06/09/2021 PCP: Veneda Melter, FNP  Outpatient Specialists: none    Brief Narrative:   Jamie Wilkins is a 60 y.o. female with medical history significant of alcohol abuse, essential hypertension, asthma, suspected liver cirrhosis who went to the [place] [facility] today with complaint of weakness and vomiting.  Also reported right upper quadrant epigastric and abdominal pain.  Symptoms have been on and off for the last 3 months.  In the last 3 days they have gotten worse.  She has been feeling weak.  Short of breath with little activity.  She has felt occasionally like she was going to pass out.  She denied bright red blood per rectum but has noticed tarry stools on and off over the last 3 months.  Patient was seen in April of this year with hemoglobin about 10.2.  Today in the ER her hemoglobin is down to 4.7.  She has no diarrhea.  Patient has hypokalemia hyponatremia mildly elevated LFTs.  No obvious active bleed at the moment but patient has symptomatic anemia from suspected GI bleed.  She is therefore being admitted to the hospital for further evaluation and treatment..   Assessment & Plan:   Principal Problem:   Symptomatic anemia Active Problems:   Hypokalemia   Hypomagnesemia   Alcoholic hepatitis without ascites   Electrolyte abnormality   Malnutrition of moderate degree   Hyponatremia   AF (paroxysmal atrial fibrillation) (HCC)  # Gastrointestinal bleeding # Acute blood loss anemia Several weeks melena. No hx upper or lower endoscopy. No regular nsaid use. Is a heavy drinker. Hgb 4.7 on arrival, was 10.7 3 months ago. Transfused 3 units, hgb this morning 8.7. hemodnyamically stable - continue protonix IV gtt per GI - f/u pm cbc, maintain hgb above 7 - will touch base w/ GI about timing of endoscopy, is currently npo  # Heavy drinker # Alcohol steatosis Drinks several drinks a day - continue  thiamine, mvi - CIWA, not currently scoring  # Hypokalemia Resolved w/ repletion   DVT prophylaxis: SCDs Code Status: full Family Communication: none @ bedside  Level of care: Stepdown, will transfer to med surg Status is: Inpatient  Remains inpatient appropriate because:Inpatient level of care appropriate due to severity of illness  Dispo: The patient is from: Home              Anticipated d/c is to: Home              Patient currently is not medically stable to d/c.   Difficult to place patient No        Consultants:  GI  Procedures: none  Antimicrobials:  none    Subjective: This morning feels well, no abd pain, no n/v. Hasn't had a bm  Objective: Vitals:   06/10/21 0600 06/10/21 0615 06/10/21 0700 06/10/21 0800  BP: 116/82 115/82 124/75 115/80  Pulse: 84 84 87 69  Resp: 13 12 15 16   Temp:  98.1 F (36.7 C)    TempSrc:  Oral    SpO2: 100% 100% 100% 100%  Weight:      Height:        Intake/Output Summary (Last 24 hours) at 06/10/2021 1012 Last data filed at 06/10/2021 06/12/2021 Gross per 24 hour  Intake 2843.88 ml  Output --  Net 2843.88 ml   Filed Weights   06/09/21 1315 06/09/21 2215  Weight: 59 kg 54.7 kg    Examination:  General exam: Appears calm and comfortable  Respiratory system: Clear to auscultation. Respiratory effort normal. Cardiovascular system: S1 & S2 heard, RRR. No JVD, murmurs, rubs, gallops or clicks. No pedal edema. Gastrointestinal system: Abdomen is nondistended, soft and nontender. No organomegaly or masses felt. Normal bowel sounds heard. Central nervous system: Alert and oriented. No focal neurological deficits. Extremities: Symmetric 5 x 5 power. Skin: No rashes, lesions or ulcers Psychiatry: Judgement and insight appear normal. Mood & affect appropriate.     Data Reviewed: I have personally reviewed following labs and imaging studies  CBC: Recent Labs  Lab 06/09/21 1343 06/09/21 2116 06/10/21 0851  WBC 4.7  4.4 4.6  NEUTROABS 3.7  --   --   HGB 4.7* 6.1* 8.7*  HCT 14.2* 18.7* 25.0*  MCV 83.0 79.6* 79.4*  PLT 338 251 220   Basic Metabolic Panel: Recent Labs  Lab 06/09/21 1343 06/09/21 2116 06/10/21 0851  NA 130*  --  135  K 3.1*  --  3.8  CL 85*  --  94*  CO2 28  --  25  GLUCOSE 98  --  80  BUN 23*  --  20  CREATININE 1.05*  --  0.88  CALCIUM 7.5*  --  7.0*  MG  --  0.9*  --    GFR: Estimated Creatinine Clearance: 58.7 mL/min (by C-G formula based on SCr of 0.88 mg/dL). Liver Function Tests: Recent Labs  Lab 06/09/21 1343 06/10/21 0851  AST 53* 43*  ALT 21 18  ALKPHOS 184* 162*  BILITOT 2.3* 3.5*  PROT 5.7* 5.1*  ALBUMIN 2.6* 2.3*   Recent Labs  Lab 06/09/21 1343  LIPASE 78*   No results for input(s): AMMONIA in the last 168 hours. Coagulation Profile: No results for input(s): INR, PROTIME in the last 168 hours. Cardiac Enzymes: No results for input(s): CKTOTAL, CKMB, CKMBINDEX, TROPONINI in the last 168 hours. BNP (last 3 results) No results for input(s): PROBNP in the last 8760 hours. HbA1C: No results for input(s): HGBA1C in the last 72 hours. CBG: Recent Labs  Lab 06/09/21 2220  GLUCAP 87   Lipid Profile: No results for input(s): CHOL, HDL, LDLCALC, TRIG, CHOLHDL, LDLDIRECT in the last 72 hours. Thyroid Function Tests: No results for input(s): TSH, T4TOTAL, FREET4, T3FREE, THYROIDAB in the last 72 hours. Anemia Panel: No results for input(s): VITAMINB12, FOLATE, FERRITIN, TIBC, IRON, RETICCTPCT in the last 72 hours. Urine analysis: No results found for: COLORURINE, APPEARANCEUR, LABSPEC, PHURINE, GLUCOSEU, HGBUR, BILIRUBINUR, KETONESUR, PROTEINUR, UROBILINOGEN, NITRITE, LEUKOCYTESUR Sepsis Labs: @LABRCNTIP (procalcitonin:4,lacticidven:4)  ) Recent Results (from the past 240 hour(s))  Resp Panel by RT-PCR (Flu A&B, Covid) Nasopharyngeal Swab     Status: None   Collection Time: 06/09/21  1:43 PM   Specimen: Nasopharyngeal Swab; Nasopharyngeal(NP)  swabs in vial transport medium  Result Value Ref Range Status   SARS Coronavirus 2 by RT PCR NEGATIVE NEGATIVE Final    Comment: (NOTE) SARS-CoV-2 target nucleic acids are NOT DETECTED.  The SARS-CoV-2 RNA is generally detectable in upper respiratory specimens during the acute phase of infection. The lowest concentration of SARS-CoV-2 viral copies this assay can detect is 138 copies/mL. A negative result does not preclude SARS-Cov-2 infection and should not be used as the sole basis for treatment or other patient management decisions. A negative result may occur with  improper specimen collection/handling, submission of specimen other than nasopharyngeal swab, presence of viral mutation(s) within the areas targeted by this assay, and inadequate number of viral copies(<138 copies/mL). A  negative result must be combined with clinical observations, patient history, and epidemiological information. The expected result is Negative.  Fact Sheet for Patients:  BloggerCourse.comhttps://www.fda.gov/media/152166/download  Fact Sheet for Healthcare Providers:  SeriousBroker.ithttps://www.fda.gov/media/152162/download  This test is no t yet approved or cleared by the [place] FDA and  has been authorized for detection and/or diagnosis of SARS-CoV-2 by FDA under an Emergency Use Authorization (EUA). This EUA will remain  in effect (meaning this test can be used) for the duration of the COVID-19 declaration under Section 564(b)(1) of the Act, 21 U.S.C.section 360bbb-3(b)(1), unless the authorization is terminated  or revoked sooner.       Influenza A by PCR NEGATIVE NEGATIVE Final   Influenza B by PCR NEGATIVE NEGATIVE Final    Comment: (NOTE) The Xpert Xpress SARS-CoV-2/FLU/RSV plus assay is intended as an aid in the diagnosis of influenza from Nasopharyngeal swab specimens and should not be used as a sole basis for treatment. Nasal washings and aspirates are unacceptable for Xpert Xpress  SARS-CoV-2/FLU/RSV testing.  Fact Sheet for Patients: BloggerCourse.comhttps://www.fda.gov/media/152166/download  Fact Sheet for Healthcare Providers: SeriousBroker.ithttps://www.fda.gov/media/152162/download  This test is not yet approved or cleared by the [place] FDA and has been authorized for detection and/or diagnosis of SARS-CoV-2 by FDA under an Emergency Use Authorization (EUA). This EUA will remain in effect (meaning this test can be used) for the duration of the COVID-19 declaration under Section 564(b)(1) of the Act, 21 U.S.C. section 360bbb-3(b)(1), unless the authorization is terminated or revoked.  Performed at [facility], [address] [place], [place] [address]   MRSA Next Gen by PCR, Nasal     Status: None   Collection Time: 06/09/21  8:28 PM   Specimen: Nasal Mucosa; Nasal Swab  Result Value Ref Range Status   MRSA by PCR Next Gen NOT DETECTED NOT DETECTED Final    Comment: (NOTE) The GeneXpert MRSA Assay (FDA approved for NASAL specimens only), is one component of a comprehensive MRSA colonization surveillance program. It is not intended to diagnose MRSA infection nor to guide or monitor treatment for MRSA infections. Test performance is not FDA approved in patients less than 61 years old. Performed at [facility], [address] [place], [place] [address]          Radiology Studies: CT ABDOMEN PELVIS W CONTRAST  Result Date: 06/09/2021 CLINICAL DATA:  Nonlocalized acute abdominal pain. Vomiting X 1 month. Shortness of breath on exertion X 2 days. EXAM: CT ABDOMEN AND PELVIS WITH CONTRAST TECHNIQUE: Multidetector CT imaging of the abdomen and pelvis was performed using the standard protocol following bolus administration of intravenous contrast. CONTRAST:  75mL OMNIPAQUE IOHEXOL 300 MG/ML  SOLN COMPARISON:  Ultrasound abdomen 06/09/2021, CT abdomen pelvis 02/15/2021 FINDINGS: Lower chest: No acute abnormality. Hepatobiliary: The hepatic parenchyma is  markedly diffusely hypodense compared to the splenic parenchyma consistent with fatty infiltration. No definite focal liver abnormality. Vague layering hyperdensity within the gallbladder lumen consistent with gallbladder sludge. No biliary dilatation. Pancreas: No focal lesion. Normal pancreatic contour. No surrounding inflammatory changes. No main pancreatic ductal dilatation. Spleen: Similar-appearing subcentimeter hypodensity within the spleen is nonspecific (2:16). Otherwise normal in size without focal abnormality. Adrenals/Urinary Tract: No adrenal nodule bilaterally. Bilateral kidneys enhance symmetrically. Subcentimeter hypodensities are too small to characterize. Possible punctate calcified stone within the left kidney (5:42). No hydronephrosis. No hydroureter. The urinary bladder is unremarkable. Stomach/Bowel: Stomach is within normal limits. No evidence of bowel wall thickening or dilatation. Appendix appears normal. Vascular/Lymphatic: No abdominal aorta  or iliac aneurysm. Severe calcified and noncalcified atherosclerotic plaque of the aorta and its branches. No abdominal, pelvic, or inguinal lymphadenopathy. Reproductive: Status post hysterectomy.  No adnexal mass identified. Other: No intraperitoneal free fluid. No intraperitoneal free gas. No organized fluid collection. Musculoskeletal: No abdominal wall hernia or abnormality. No suspicious lytic or blastic osseous lesions. No acute displaced fracture. Multilevel degenerative changes of the spine. IMPRESSION: 1. Marked hepatic steatosis with limited evaluation for focal hepatic lesion. 2. Gallbladder sludge. No findings suggest acute cholecystitis or choledocholithiasis. 3.  Aortic Atherosclerosis (ICD10-I70.0). Electronically Signed   By: Tish Frederickson M.D.   On: 06/09/2021 15:18   DG Chest Portable 1 View  Result Date: 06/09/2021 CLINICAL DATA:  Weakness and shortness of breath on exertion for 2 days. EXAM: PORTABLE CHEST 1 VIEW COMPARISON:   CT chest 02/15/2021.  PA and lateral chest 02/14/2021. FINDINGS: The lungs are emphysematous but clear. Heart size is normal. Aortic atherosclerosis noted. No pneumothorax or pleural fluid. No acute or focal bony abnormality. IMPRESSION: No acute disease. Aortic Atherosclerosis (ICD10-I70.0) and Emphysema (ICD10-J43.9). Electronically Signed   By: Drusilla Kanner M.D.   On: 06/09/2021 14:14   US Abdomen Limited RUQ (LIVER/GB)  Result Date: 06/09/2021 CLINICAL DATA:  Right upper quadrant pain for the last month. EXAM: ULTRASOUND ABDOMEN LIMITED RIGHT UPPER QUADRANT COMPARISON:  Right upper quadrant ultrasound dated February 15, 2021. FINDINGS: Gallbladder: Unchanged prominent sludge. No gallstones or wall thickening visualized. No sonographic Murphy sign noted by sonographer. Common bile duct: Diameter: 3 mm, normal. Liver: No focal lesion identified. Unchanged diffusely increased in parenchymal echogenicity. Portal vein is patent on color Doppler imaging with normal direction of blood flow towards the liver. Other: None. IMPRESSION: 1. No acute abnormality. 2. Unchanged gallbladder sludge and hepatic steatosis. Electronically Signed   By: Obie Dredge M.D.   On: 06/09/2021 14:29        Scheduled Meds:  Chlorhexidine Gluconate Cloth  6 each Topical Daily   Continuous Infusions:  sodium chloride     pantoprazole 8 mg/hr (06/10/21 0702)   banana bag IV 1000 mL 75 mL/hr at 06/10/21 0319     LOS: 1 day    Time spent: 30 min    Silvano Bilis, MD [facility]   If 7PM-7AM, please contact night-coverage [email or web address] Password [facility] 06/10/2021, 10:12 AM

## 2021-06-10 NOTE — Progress Notes (Signed)
Patient has received 2 units of PRBCs while on this unit and 1 in the ER. Tolerated blood infusions well with no adverse reaction noted. O2 sat dropped to 28% while sleeping and 2L/M was applied via nasal cannula. Tolerated well and drop in sats did not reoccur. Patient is alert and oriented with no complaints of pain or discomfort. Incontinent of bowel and bladder. Call bell in reach. Will continue to monitor.

## 2021-06-11 ENCOUNTER — Encounter: Payer: Self-pay | Admitting: Internal Medicine

## 2021-06-11 LAB — BASIC METABOLIC PANEL
Anion gap: 10 (ref 5–15)
BUN: 15 mg/dL (ref 6–20)
CO2: 26 mmol/L (ref 22–32)
Calcium: 7.2 mg/dL — ABNORMAL LOW (ref 8.9–10.3)
Chloride: 99 mmol/L (ref 98–111)
Creatinine, Ser: 0.73 mg/dL (ref 0.44–1.00)
GFR, Estimated: >60 mL/min (ref >60)
Glucose, Bld: 85 mg/dL (ref 70–99)
Potassium: 3.3 mmol/L — ABNORMAL LOW (ref 3.5–5.1)
Sodium: 135 mmol/L (ref 135–145)

## 2021-06-11 LAB — TYPE AND SCREEN
ABO/RH(D): A POS
Antibody Screen: NEGATIVE
Unit division: 0
Unit division: 0
Unit division: 0
Unit division: 0
Unit division: 0

## 2021-06-11 LAB — BPAM RBC
Blood Product Expiration Date: 202207222359
Blood Product Expiration Date: 202207222359
Blood Product Expiration Date: 202207242359
Blood Product Expiration Date: 202208112359
Blood Product Expiration Date: 202208122359
ISSUE DATE / TIME: 202207201734
ISSUE DATE / TIME: 202207202108
ISSUE DATE / TIME: 202207210259
Unit Type and Rh: 600
Unit Type and Rh: 600
Unit Type and Rh: 600
Unit Type and Rh: 6200
Unit Type and Rh: 6200

## 2021-06-11 LAB — CBC
HCT: 24.6 % — ABNORMAL LOW (ref 36.0–46.0)
Hemoglobin: 8.5 g/dL — ABNORMAL LOW (ref 12.0–15.0)
MCH: 27.4 pg (ref 26.0–34.0)
MCHC: 34.6 g/dL (ref 30.0–36.0)
MCV: 79.4 fL — ABNORMAL LOW (ref 80.0–100.0)
Platelets: 199 10*3/uL (ref 150–400)
RBC: 3.1 MIL/uL — ABNORMAL LOW (ref 3.87–5.11)
RDW: 20.7 % — ABNORMAL HIGH (ref 11.5–15.5)
WBC: 4.5 10*3/uL (ref 4.0–10.5)
nRBC: 0 % (ref 0.0–0.2)

## 2021-06-11 LAB — MAGNESIUM: Magnesium: 1.2 mg/dL — ABNORMAL LOW (ref 1.7–2.4)

## 2021-06-11 MED ORDER — POLYETHYLENE GLYCOL 3350 17 GM/SCOOP PO POWD
1.0000 | Freq: Once | ORAL | Status: AC
  Administered 2021-06-11: 255 g via ORAL
  Filled 2021-06-11 (×2): qty 255

## 2021-06-11 MED ORDER — BISACODYL 5 MG PO TBEC
10.0000 mg | DELAYED_RELEASE_TABLET | Freq: Once | ORAL | Status: AC
  Administered 2021-06-11: 13:00:00 10 mg via ORAL
  Filled 2021-06-11: qty 2

## 2021-06-11 MED ORDER — NICOTINE 14 MG/24HR TD PT24
14.0000 mg | MEDICATED_PATCH | Freq: Every day | TRANSDERMAL | Status: DC
  Administered 2021-06-11 – 2021-06-12 (×2): 14 mg via TRANSDERMAL
  Filled 2021-06-11 (×2): qty 1

## 2021-06-11 MED ORDER — POTASSIUM CHLORIDE CRYS ER 20 MEQ PO TBCR
60.0000 meq | EXTENDED_RELEASE_TABLET | Freq: Once | ORAL | Status: AC
  Administered 2021-06-11: 13:00:00 60 meq via ORAL
  Filled 2021-06-11: qty 3

## 2021-06-11 MED ORDER — MAGNESIUM SULFATE 4 GM/100ML IV SOLN
4.0000 g | Freq: Once | INTRAVENOUS | Status: AC
  Administered 2021-06-11: 17:00:00 4 g via INTRAVENOUS
  Filled 2021-06-11: qty 100

## 2021-06-11 NOTE — Progress Notes (Signed)
GI Inpatient Follow-up Note  Subjective:  Patient seen in follow-up for profound anemia and melena. She is s/p EGD yesterday with Dr. Norma Fredrickson which was negative for any source of bleeding. She was scheduled to have colonoscopy this morning, but prep orders were not released so patient didn't get her prep. No acute events overnight. Hemoglobin has been stable. No further melena or overt GI blood loss. Pt tolerating clear liquid diet fine. She denies nausea, vomiting, or abdominal pain today. Biggest complaint if IV in her left arm.   Scheduled Inpatient Medications:   Chlorhexidine Gluconate Cloth  6 each Topical Daily   nicotine  14 mg Transdermal Daily   polyethylene glycol powder  1 Container Oral Once    Continuous Inpatient Infusions:    sodium chloride      PRN Inpatient Medications:  ondansetron **OR** ondansetron (ZOFRAN) IV  Review of Systems: Constitutional: Weight is stable.  Eyes: No changes in vision. ENT: No oral lesions, sore throat.  GI: see HPI.  Heme/Lymph: No easy bruising.  CV: No chest pain.  GU: No hematuria.  Integumentary: No rashes.  Neuro: No headaches.  Psych: No depression/anxiety.  Endocrine: No heat/cold intolerance.  Allergic/Immunologic: No urticaria.  Resp: No cough, SOB.  Musculoskeletal: No joint swelling.    Physical Examination: BP (!) 99/54 (BP Location: Right Wrist)   Pulse 77   Temp 97.9 F (36.6 C)   Resp 18   Ht 5\' 7"  (1.702 m)   Wt 60.5 kg   LMP 05/11/2010 (Approximate)   SpO2 99%   BMI 20.89 kg/m  Gen: NAD, alert and oriented x 4 HEENT: PEERLA, EOMI, Neck: supple, no JVD or thyromegaly Chest: CTA bilaterally, no wheezes, crackles, or other adventitious sounds CV: RRR, no m/g/c/r Abd: soft, NT, ND, +BS in all four quadrants; no HSM, guarding, ridigity, or rebound tenderness Ext: no edema, well perfused with 2+ pulses, Skin: no rash or lesions noted Lymph: no LAD  Data: Lab Results  Component Value Date   WBC 4.5  06/11/2021   HGB 8.5 (L) 06/11/2021   HCT 24.6 (L) 06/11/2021   MCV 79.4 (L) 06/11/2021   PLT 199 06/11/2021   Recent Labs  Lab 06/10/21 0851 06/10/21 1600 06/11/21 0529  HGB 8.7* 8.9* 8.5*   Lab Results  Component Value Date   NA 135 06/11/2021   K 3.3 (L) 06/11/2021   CL 99 06/11/2021   CO2 26 06/11/2021   BUN 15 06/11/2021   CREATININE 0.73 06/11/2021   Lab Results  Component Value Date   ALT 18 06/10/2021   AST 43 (H) 06/10/2021   ALKPHOS 162 (H) 06/10/2021   BILITOT 3.5 (H) 06/10/2021   No results for input(s): APTT, INR, PTT in the last 168 hours.  Assessment/Plan:  60 y/o AA female with a PMH of HTN, asthma, and alcohol abuse admitted to the hospital for acute generalized weakness, vomiting, and associated RUQ/epigastric abdominal pain. GI consulted due to profound anemia    1. Profound anemia/Melena/RUQ/epigastric abd pain GI Bleed - s/p EGD yesterday which was negative for any source of bleeding. Colonoscopy was scheduled for today, but not performed because prep was not given last night. H&H stable with no further overt gastrointestinal blood loss.    2. Alcohol abuse - high risk for withdrawal. On CIWA protocol.   3. Hypokalemia - repleted today   4. Transaminitis - likely 2/2 #2.  Recommendations:  - H&H stable with no overt gastrointestinal blood loss - Continue to monitor daily  H&H - Plan for colonoscopy tomorrow with Dr. Allegra Lai who is on call this weekend and will follow the patient - If colonoscopy is negative, will plan for video capsule endoscopy to rule out small bowel bleeding - Clear liquid diet today. NPO after midnight. Bowel prep orders placed.  - See procedure note for findings and further recommendations  I reviewed the risks (including bleeding, perforation, infection, anesthesia complications, cardiac/respiratory complications), benefits and alternatives of colonoscopy. Patient consents to proceed.    Please call with questions or  concerns.    Jacob Moores, PA-C [facility] [phone] (Cell)

## 2021-06-11 NOTE — Progress Notes (Signed)
PROGRESS NOTE    Jamie Wilkins  NLZ:[id number] DOB: 1961/10/14 DOA: 06/09/2021 PCP: Veneda Melter, FNP  Outpatient Specialists: none    Brief Narrative:   Jamie Wilkins is a 60 y.o. female with medical history significant of alcohol abuse, essential hypertension, asthma, suspected liver cirrhosis who went to the [facility] today with complaint of weakness and vomiting.  Also reported right upper quadrant epigastric and abdominal pain.  Symptoms have been on and off for the last 3 months.  In the last 3 days they have gotten worse.  She has been feeling weak.  Short of breath with little activity.  She has felt occasionally like she was going to pass out.  She denied bright red blood per rectum but has noticed tarry stools on and off over the last 3 months.  Patient was seen in April of this year with hemoglobin about 10.2.  Today in the ER her hemoglobin is down to 4.7.  She has no diarrhea.  Patient has hypokalemia hyponatremia mildly elevated LFTs.  No obvious active bleed at the moment but patient has symptomatic anemia from suspected GI bleed.  She is therefore being admitted to the hospital for further evaluation and treatment..   Assessment & Plan:   Principal Problem:   Symptomatic anemia Active Problems:   Hypokalemia   Hypomagnesemia   Alcoholic hepatitis without ascites   Electrolyte abnormality   Malnutrition of moderate degree   Hyponatremia   AF (paroxysmal atrial fibrillation) (HCC)   Protein-calorie malnutrition, severe  # Gastrointestinal bleeding # Acute blood loss anemia Several weeks melena. No hx upper or lower endoscopy. No regular nsaid use. Is a heavy drinker. Hgb 4.7 on arrival, was 10.7 3 months ago. Transfused 3 units, hgb this morning 8.4 and stable. Hemodynamically stable. Upper endoscopy 7/21 unrevealing. Plan for colonoscopy today but for some reason didn't get prep last night so will prep tonight with plan for colonoscopy tomorrow - stop  pantop gtt - trend h/h  # Heavy drinker # Alcohol steatosis Drinks several drinks a day - continue thiamine, mvi - CIWA, not currently scoring  # Hypokalemia 3.3 today.  - f/u mg level - 60 meq oral ordered   DVT prophylaxis: SCDs Code Status: full Family Communication: none @ bedside  Level of care: Med-Surg Status is: Inpatient  Remains inpatient appropriate because:Inpatient level of care appropriate due to severity of illness  Dispo: The patient is from: Home              Anticipated d/c is to: Home              Patient currently is not medically stable to d/c.   Difficult to place patient No        Consultants:  GI  Procedures: none  Antimicrobials:  none    Subjective: This morning feels well, no abd pain, no n/v. Hasn't had a bm.   Objective: Vitals:   06/10/21 2200 06/10/21 2345 06/11/21 0648 06/11/21 0730  BP: 101/67 98/67 108/75 94/68  Pulse: 96 83 84 81  Resp: 19 16 18 18   Temp: 98.3 F (36.8 C) 98.1 F (36.7 C) 98 F (36.7 C) 97.6 F (36.4 C)  TempSrc: Oral Oral Oral Oral  SpO2: 95% 97% 99% 98%  Weight:  60.5 kg    Height:  5\' 7"  (1.702 m)     No intake or output data in the 24 hours ending 06/11/21 0934  Filed Weights   06/09/21 2215 06/10/21  1332 06/10/21 2345  Weight: 54.7 kg 59 kg 60.5 kg    Examination:  General exam: Appears calm and comfortable  Respiratory system: Clear to auscultation. Respiratory effort normal. Cardiovascular system: S1 & S2 heard, RRR. No JVD, murmurs, rubs, gallops or clicks. No pedal edema. Gastrointestinal system: Abdomen is nondistended, soft and nontender. No organomegaly or masses felt. Normal bowel sounds heard. Central nervous system: Alert and oriented. No focal neurological deficits. Extremities: Symmetric 5 x 5 power. Skin: No rashes, lesions or ulcers Psychiatry: Judgement and insight appear normal. Mood & affect appropriate.     Data Reviewed: I have personally reviewed following  labs and imaging studies  CBC: Recent Labs  Lab 06/09/21 1343 06/09/21 2116 06/10/21 0851 06/10/21 1600 06/11/21 0529  WBC 4.7 4.4 4.6  --  4.5  NEUTROABS 3.7  --   --   --   --   HGB 4.7* 6.1* 8.7* 8.9* 8.5*  HCT 14.2* 18.7* 25.0*  --  24.6*  MCV 83.0 79.6* 79.4*  --  79.4*  PLT 338 251 220  --  199   Basic Metabolic Panel: Recent Labs  Lab 06/09/21 1343 06/09/21 2116 06/10/21 0851 06/11/21 0529  NA 130*  --  135 135  K 3.1*  --  3.8 3.3*  CL 85*  --  94* 99  CO2 28  --  25 26  GLUCOSE 98  --  80 85  BUN 23*  --  20 15  CREATININE 1.05*  --  0.88 0.73  CALCIUM 7.5*  --  7.0* 7.2*  MG  --  0.9*  --   --    GFR: Estimated Creatinine Clearance: 71.4 mL/min (by C-G formula based on SCr of 0.73 mg/dL). Liver Function Tests: Recent Labs  Lab 06/09/21 1343 06/10/21 0851  AST 53* 43*  ALT 21 18  ALKPHOS 184* 162*  BILITOT 2.3* 3.5*  PROT 5.7* 5.1*  ALBUMIN 2.6* 2.3*   Recent Labs  Lab 06/09/21 1343  LIPASE 78*   No results for input(s): AMMONIA in the last 168 hours. Coagulation Profile: No results for input(s): INR, PROTIME in the last 168 hours. Cardiac Enzymes: No results for input(s): CKTOTAL, CKMB, CKMBINDEX, TROPONINI in the last 168 hours. BNP (last 3 results) No results for input(s): PROBNP in the last 8760 hours. HbA1C: No results for input(s): HGBA1C in the last 72 hours. CBG: Recent Labs  Lab 06/09/21 2220  GLUCAP 87   Lipid Profile: No results for input(s): CHOL, HDL, LDLCALC, TRIG, CHOLHDL, LDLDIRECT in the last 72 hours. Thyroid Function Tests: No results for input(s): TSH, T4TOTAL, FREET4, T3FREE, THYROIDAB in the last 72 hours. Anemia Panel: No results for input(s): VITAMINB12, FOLATE, FERRITIN, TIBC, IRON, RETICCTPCT in the last 72 hours. Urine analysis: No results found for: COLORURINE, APPEARANCEUR, LABSPEC, PHURINE, GLUCOSEU, HGBUR, BILIRUBINUR, KETONESUR, PROTEINUR, UROBILINOGEN, NITRITE, LEUKOCYTESUR Sepsis  Labs: @LABRCNTIP (procalcitonin:4,lacticidven:4)  ) Recent Results (from the past 240 hour(s))  Resp Panel by RT-PCR (Flu A&B, Covid) Nasopharyngeal Swab     Status: None   Collection Time: 06/09/21  1:43 PM   Specimen: Nasopharyngeal Swab; Nasopharyngeal(NP) swabs in vial transport medium  Result Value Ref Range Status   SARS Coronavirus 2 by RT PCR NEGATIVE NEGATIVE Final    Comment: (NOTE) SARS-CoV-2 target nucleic acids are NOT DETECTED.  The SARS-CoV-2 RNA is generally detectable in upper respiratory specimens during the acute phase of infection. The lowest concentration of SARS-CoV-2 viral copies this assay can detect is 138 copies/mL. A negative result  does not preclude SARS-Cov-2 infection and should not be used as the sole basis for treatment or other patient management decisions. A negative result may occur with  improper specimen collection/handling, submission of specimen other than nasopharyngeal swab, presence of viral mutation(s) within the areas targeted by this assay, and inadequate number of viral copies(<138 copies/mL). A negative result must be combined with clinical observations, patient history, and epidemiological information. The expected result is Negative.  Fact Sheet for Patients:  BloggerCourse.com  Fact Sheet for Healthcare Providers:  SeriousBroker.it  This test is no t yet approved or cleared by the [place] FDA and  has been authorized for detection and/or diagnosis of SARS-CoV-2 by FDA under an Emergency Use Authorization (EUA). This EUA will remain  in effect (meaning this test can be used) for the duration of the COVID-19 declaration under Section 564(b)(1) of the Act, 21 U.S.C.section 360bbb-3(b)(1), unless the authorization is terminated  or revoked sooner.       Influenza A by PCR NEGATIVE NEGATIVE Final   Influenza B by PCR NEGATIVE NEGATIVE Final    Comment: (NOTE) The Xpert  Xpress SARS-CoV-2/FLU/RSV plus assay is intended as an aid in the diagnosis of influenza from Nasopharyngeal swab specimens and should not be used as a sole basis for treatment. Nasal washings and aspirates are unacceptable for Xpert Xpress SARS-CoV-2/FLU/RSV testing.  Fact Sheet for Patients: BloggerCourse.com  Fact Sheet for Healthcare Providers: SeriousBroker.it  This test is not yet approved or cleared by the [place] FDA and has been authorized for detection and/or diagnosis of SARS-CoV-2 by FDA under an Emergency Use Authorization (EUA). This EUA will remain in effect (meaning this test can be used) for the duration of the COVID-19 declaration under Section 564(b)(1) of the Act, 21 U.S.C. section 360bbb-3(b)(1), unless the authorization is terminated or revoked.  Performed at [facility], [address] [place], [place] [address]   MRSA Next Gen by PCR, Nasal     Status: None   Collection Time: 06/09/21  8:28 PM   Specimen: Nasal Mucosa; Nasal Swab  Result Value Ref Range Status   MRSA by PCR Next Gen NOT DETECTED NOT DETECTED Final    Comment: (NOTE) The GeneXpert MRSA Assay (FDA approved for NASAL specimens only), is one component of a comprehensive MRSA colonization surveillance program. It is not intended to diagnose MRSA infection nor to guide or monitor treatment for MRSA infections. Test performance is not FDA approved in patients less than 57 years old. Performed at [facility], [address], [place], [place] [address]          Radiology Studies: CT ABDOMEN PELVIS W CONTRAST  Result Date: 06/09/2021 CLINICAL DATA:  Nonlocalized acute abdominal pain. Vomiting X 1 month. Shortness of breath on exertion X 2 days. EXAM: CT ABDOMEN AND PELVIS WITH CONTRAST TECHNIQUE: Multidetector CT imaging of the abdomen and pelvis was performed using the standard protocol following bolus  administration of intravenous contrast. CONTRAST:  9mL OMNIPAQUE IOHEXOL 300 MG/ML  SOLN COMPARISON:  Ultrasound abdomen 06/09/2021, CT abdomen pelvis 02/15/2021 FINDINGS: Lower chest: No acute abnormality. Hepatobiliary: The hepatic parenchyma is markedly diffusely hypodense compared to the splenic parenchyma consistent with fatty infiltration. No definite focal liver abnormality. Vague layering hyperdensity within the gallbladder lumen consistent with gallbladder sludge. No biliary dilatation. Pancreas: No focal lesion. Normal pancreatic contour. No surrounding inflammatory changes. No main pancreatic ductal dilatation. Spleen: Similar-appearing subcentimeter hypodensity within the spleen is nonspecific (2:16). Otherwise normal in size without focal  abnormality. Adrenals/Urinary Tract: No adrenal nodule bilaterally. Bilateral kidneys enhance symmetrically. Subcentimeter hypodensities are too small to characterize. Possible punctate calcified stone within the left kidney (5:42). No hydronephrosis. No hydroureter. The urinary bladder is unremarkable. Stomach/Bowel: Stomach is within normal limits. No evidence of bowel wall thickening or dilatation. Appendix appears normal. Vascular/Lymphatic: No abdominal aorta or iliac aneurysm. Severe calcified and noncalcified atherosclerotic plaque of the aorta and its branches. No abdominal, pelvic, or inguinal lymphadenopathy. Reproductive: Status post hysterectomy.  No adnexal mass identified. Other: No intraperitoneal free fluid. No intraperitoneal free gas. No organized fluid collection. Musculoskeletal: No abdominal wall hernia or abnormality. No suspicious lytic or blastic osseous lesions. No acute displaced fracture. Multilevel degenerative changes of the spine. IMPRESSION: 1. Marked hepatic steatosis with limited evaluation for focal hepatic lesion. 2. Gallbladder sludge. No findings suggest acute cholecystitis or choledocholithiasis. 3.  Aortic Atherosclerosis  (ICD10-I70.0). Electronically Signed   By: Tish FredericksonMorgane  Naveau M.D.   On: 06/09/2021 15:18   DG Chest Portable 1 View  Result Date: 06/09/2021 CLINICAL DATA:  Weakness and shortness of breath on exertion for 2 days. EXAM: PORTABLE CHEST 1 VIEW COMPARISON:  CT chest 02/15/2021.  PA and lateral chest 02/14/2021. FINDINGS: The lungs are emphysematous but clear. Heart size is normal. Aortic atherosclerosis noted. No pneumothorax or pleural fluid. No acute or focal bony abnormality. IMPRESSION: No acute disease. Aortic Atherosclerosis (ICD10-I70.0) and Emphysema (ICD10-J43.9). Electronically Signed   By: Drusilla Kannerhomas  Dalessio M.D.   On: 06/09/2021 14:14   US Abdomen Limited RUQ (LIVER/GB)  Result Date: 06/09/2021 CLINICAL DATA:  Right upper quadrant pain for the last month. EXAM: ULTRASOUND ABDOMEN LIMITED RIGHT UPPER QUADRANT COMPARISON:  Right upper quadrant ultrasound dated February 15, 2021. FINDINGS: Gallbladder: Unchanged prominent sludge. No gallstones or wall thickening visualized. No sonographic Murphy sign noted by sonographer. Common bile duct: Diameter: 3 mm, normal. Liver: No focal lesion identified. Unchanged diffusely increased in parenchymal echogenicity. Portal vein is patent on color Doppler imaging with normal direction of blood flow towards the liver. Other: None. IMPRESSION: 1. No acute abnormality. 2. Unchanged gallbladder sludge and hepatic steatosis. Electronically Signed   By: Obie DredgeWilliam T Derry M.D.   On: 06/09/2021 14:29        Scheduled Meds:  bisacodyl  10 mg Oral Once   Chlorhexidine Gluconate Cloth  6 each Topical Daily   nicotine  14 mg Transdermal Daily   polyethylene glycol powder  1 Container Oral Once   potassium chloride  60 mEq Oral Once   Continuous Infusions:  sodium chloride     pantoprazole 8 mg/hr (06/10/21 1259)     LOS: 2 days    Time spent: 30 min    Silvano BilisNoah B Kassie Keng, MD [facility]   If 7PM-7AM, please contact night-coverage [email or web address] Password  [email or web address] 06/11/2021, 9:34 AM

## 2021-06-11 NOTE — Progress Notes (Signed)
Colonoscopy prep was not given to Jamie Wilkins on 06/10/2021. Per Dr. Norma Fredrickson, Jamie Wilkins will need prep given today at 1700 for colonoscopy tomorrow by Dr. Allegra Lai.

## 2021-06-12 ENCOUNTER — Encounter
Admission: EM | Disposition: A | Discharge status: Home / Self Care | Payer: Self-pay | Source: Home / Self Care | Attending: Obstetrics and Gynecology

## 2021-06-12 ENCOUNTER — Inpatient Hospital Stay: Payer: Self-pay | Admitting: Anesthesiology

## 2021-06-12 DIAGNOSIS — K921 Melena: Secondary | ICD-10-CM

## 2021-06-12 DIAGNOSIS — K529 Noninfective gastroenteritis and colitis, unspecified: Secondary | ICD-10-CM

## 2021-06-12 HISTORY — PX: GIVENS CAPSULE STUDY: SHX5432

## 2021-06-12 HISTORY — PX: COLONOSCOPY: SHX5424

## 2021-06-12 LAB — BASIC METABOLIC PANEL
Anion gap: 7 (ref 5–15)
BUN: 10 mg/dL (ref 6–20)
CO2: 24 mmol/L (ref 22–32)
Calcium: 7.6 mg/dL — ABNORMAL LOW (ref 8.9–10.3)
Chloride: 102 mmol/L (ref 98–111)
Creatinine, Ser: 0.67 mg/dL (ref 0.44–1.00)
GFR, Estimated: >60 mL/min (ref >60)
Glucose, Bld: 80 mg/dL (ref 70–99)
Potassium: 3.3 mmol/L — ABNORMAL LOW (ref 3.5–5.1)
Sodium: 133 mmol/L — ABNORMAL LOW (ref 135–145)

## 2021-06-12 LAB — MAGNESIUM: Magnesium: 1.4 mg/dL — ABNORMAL LOW (ref 1.7–2.4)

## 2021-06-12 LAB — CBC
HCT: 25.4 % — ABNORMAL LOW (ref 36.0–46.0)
Hemoglobin: 8.6 g/dL — ABNORMAL LOW (ref 12.0–15.0)
MCH: 26.9 pg (ref 26.0–34.0)
MCHC: 33.9 g/dL (ref 30.0–36.0)
MCV: 79.4 fL — ABNORMAL LOW (ref 80.0–100.0)
Platelets: 184 10*3/uL (ref 150–400)
RBC: 3.2 MIL/uL — ABNORMAL LOW (ref 3.87–5.11)
RDW: 20.7 % — ABNORMAL HIGH (ref 11.5–15.5)
WBC: 4.1 10*3/uL (ref 4.0–10.5)
nRBC: 0.5 % — ABNORMAL HIGH (ref 0.0–0.2)

## 2021-06-12 LAB — IRON AND TIBC
Iron: 20 ug/dL — ABNORMAL LOW (ref 28–170)
Saturation Ratios: 15 % (ref 10.4–31.8)
TIBC: 132 ug/dL — ABNORMAL LOW (ref 250–450)
UIBC: 112 ug/dL

## 2021-06-12 LAB — FERRITIN: Ferritin: 1468 ng/mL — ABNORMAL HIGH (ref 11–307)

## 2021-06-12 LAB — FOLATE: Folate: 6 ng/mL (ref >5.9)

## 2021-06-12 LAB — VITAMIN B12: Vitamin B-12: 1132 pg/mL — ABNORMAL HIGH (ref 180–914)

## 2021-06-12 SURGERY — COLONOSCOPY WITH PROPOFOL
Anesthesia: General

## 2021-06-12 SURGERY — COLONOSCOPY
Anesthesia: Monitor Anesthesia Care

## 2021-06-12 MED ORDER — ALBUTEROL SULFATE HFA 108 (90 BASE) MCG/ACT IN AERS
INHALATION_SPRAY | RESPIRATORY_TRACT | Status: AC
  Filled 2021-06-12: qty 6.7

## 2021-06-12 MED ORDER — LIDOCAINE HCL (PF) 2 % IJ SOLN
INTRAMUSCULAR | Status: AC
  Filled 2021-06-12: qty 5

## 2021-06-12 MED ORDER — SODIUM CHLORIDE 0.9 % IV SOLN
300.0000 mg | Freq: Once | INTRAVENOUS | Status: AC
  Administered 2021-06-12: 14:00:00 300 mg via INTRAVENOUS
  Filled 2021-06-12: qty 15

## 2021-06-12 MED ORDER — FERROUS SULFATE 325 (65 FE) MG PO TABS: 325.0000 mg | ORAL_TABLET | ORAL | 1 refills | Status: AC

## 2021-06-12 MED ORDER — PHENYLEPHRINE HCL (PRESSORS) 10 MG/ML IV SOLN
INTRAVENOUS | Status: DC | PRN
  Administered 2021-06-12: 100 ug via INTRAVENOUS

## 2021-06-12 MED ORDER — MAGNESIUM SULFATE 4 GM/100ML IV SOLN
4.0000 g | Freq: Once | INTRAVENOUS | Status: AC
  Administered 2021-06-12: 4 g via INTRAVENOUS
  Filled 2021-06-12: qty 100

## 2021-06-12 MED ORDER — LIDOCAINE HCL (CARDIAC) PF 100 MG/5ML IV SOSY
PREFILLED_SYRINGE | INTRAVENOUS | Status: DC | PRN
  Administered 2021-06-12: 100 mg via INTRAVENOUS

## 2021-06-12 MED ORDER — ALBUTEROL SULFATE HFA 108 (90 BASE) MCG/ACT IN AERS
INHALATION_SPRAY | RESPIRATORY_TRACT | Status: DC | PRN
  Administered 2021-06-12: 3 via RESPIRATORY_TRACT

## 2021-06-12 MED ORDER — POTASSIUM CHLORIDE CRYS ER 20 MEQ PO TBCR
40.0000 meq | EXTENDED_RELEASE_TABLET | Freq: Once | ORAL | Status: DC
Start: 2021-06-12 — End: 2021-06-12
  Filled 2021-06-12: qty 2

## 2021-06-12 MED ORDER — PROPOFOL 500 MG/50ML IV EMUL
INTRAVENOUS | Status: AC
  Filled 2021-06-12: qty 50

## 2021-06-12 MED ORDER — LACTATED RINGERS IV SOLN: INTRAVENOUS | Status: DC | PRN

## 2021-06-12 MED ORDER — SODIUM CHLORIDE 0.9 % IV SOLN: INTRAVENOUS | Status: DC

## 2021-06-12 MED ORDER — PROPOFOL 500 MG/50ML IV EMUL
INTRAVENOUS | Status: DC | PRN
  Administered 2021-06-12: 160 ug/kg/min via INTRAVENOUS
  Administered 2021-06-12: 80 mg via INTRAVENOUS

## 2021-06-12 NOTE — Transfer of Care (Signed)
Immediate Anesthesia Transfer of Care Note  Patient: Jamie Wilkins  Procedure(s) Performed: COLONOSCOPY GIVENS CAPSULE STUDY  Patient Location: PACU  Anesthesia Type:MAC  Level of Consciousness: awake  Airway & Oxygen Therapy: Patient Spontanous Breathing  Post-op Assessment: Report given to RN  Post vital signs: stable  Last Vitals:  Vitals Value Taken Time  BP 105/70 06/12/21 1155  Temp    Pulse 91 06/12/21 1155  Resp 21 06/12/21 1156  SpO2 95 % 06/12/21 1155  Vitals shown include unvalidated device data.  Last Pain:  Vitals:   06/12/21 1100  TempSrc:   PainSc: 0-No pain      Patients Stated Pain Goal: 0 (34/03/70 9643)  Complications: No notable events documented.

## 2021-06-12 NOTE — Op Note (Signed)
[facility] Gastroenterology Patient Name: Jamie Wilkins Procedure Date: 06/12/2021 10:57 AM MRN: [id number] Account #: [id number] Date of Birth: 05-10-1961 Admit Type: Inpatient Age: 60 Room: [facility] ENDO ROOM 1 Gender: Female Note Status: Finalized Procedure:             Colonoscopy Indications:           Last colonoscopy: June 2012, Epigastric abdominal                         pain, Abdominal pain in the right upper quadrant,                         Melena, Acute post hemorrhagic anemia Providers:             Toney Reil MD, MD Referring MD:          Valentina Shaggy. White (Referring MD) Medicines:             General Anesthesia Complications:         No immediate complications. Estimated blood loss:                         Minimal. Procedure:             Pre-Anesthesia Assessment:                        - Prior to the procedure, a History and Physical was                         performed, and patient medications and allergies were                         reviewed. The patient is competent. The risks and                         benefits of the procedure and the sedation options and                         risks were discussed with the patient. All questions                         were answered and informed consent was obtained.                         Patient identification and proposed procedure were                         verified by the physician, the nurse, the                         anesthesiologist, the anesthetist and the technician                         in the pre-procedure area in the procedure room in the                         endoscopy suite. Mental Status Examination: alert and  oriented. Airway Examination: normal oropharyngeal                         airway and neck mobility. Respiratory Examination:                         clear to auscultation. CV Examination: normal.                         Prophylactic  Antibiotics: The patient does not require                         prophylactic antibiotics. Prior Anticoagulants: The                         patient has taken no previous anticoagulant or                         antiplatelet agents. ASA Grade Assessment: III - A                         patient with severe systemic disease. After reviewing                         the risks and benefits, the patient was deemed in                         satisfactory condition to undergo the procedure. The                         anesthesia plan was to use general anesthesia.                         Immediately prior to administration of medications,                         the patient was re-assessed for adequacy to receive                         sedatives. The heart rate, respiratory rate, oxygen                         saturations, blood pressure, adequacy of pulmonary                         ventilation, and response to care were monitored                         throughout the procedure. The physical status of the                         patient was re-assessed after the procedure.                        After obtaining informed consent, the colonoscope was                         passed under direct vision. Throughout the procedure,  the patient's blood pressure, pulse, and oxygen                         saturations were monitored continuously. The                         Colonoscope was introduced through the anus and                         advanced to the the cecum, identified by appendiceal                         orifice and ileocecal valve. The colonoscopy was                         performed with moderate difficulty due to significant                         looping. Successful completion of the procedure was                         aided by applying abdominal pressure. The patient                         tolerated the procedure well. The quality of the bowel                          preparation was adequate. Findings:      The perianal and digital rectal examinations were normal. Pertinent       negatives include normal sphincter tone and no palpable rectal lesions.      Unable to intubate TI despite several attempts      Segmental severe inflammation characterized by adherent blood,       congestion (edema), erosions, erythema, friability, loss of vascularity       and mucus was found in the ascending colon. Biopsies were taken with a       cold forceps for histology.      Normal mucosa was found in the rectum, in the sigmoid colon, in the       descending colon, in the transverse colon and in the cecum. Segmental       Biopsies were taken with a cold forceps for histology.      The retroflexed view of the distal rectum and anal verge was normal and       showed no anal or rectal abnormalities. Impression:            - Segmental severe inflammation was found in the                         ascending colon secondary to colitis. Biopsied.                        - Normal mucosa in the rectum, in the sigmoid colon,                         in the descending colon, in the transverse colon and  in the cecum. Biopsied.                        - The distal rectum and anal verge are normal on                         retroflexion view. Recommendation:        - Return patient to hospital ward for ongoing care.                        - Resume regular diet today.                        - Continue present medications.                        - Await pathology results.                        - Return to GI clinic with Dr Norma Fredrickson in 4 weeks. Procedure Code(s):     --- Professional ---                        [phone], Colonoscopy, flexible; with biopsy, single or                         multiple Diagnosis Code(s):     --- Professional ---                        K52.9, Noninfective gastroenteritis and colitis,                         unspecified                         R10.13, Epigastric pain                        R10.11, Right upper quadrant pain                        D62, Acute posthemorrhagic anemia                        K92.1, Melena (includes Hematochezia) CPT copyright 2019 American Medical Association. All rights reserved. The codes documented in this report are preliminary and upon coder review may  be revised to meet current compliance requirements. Dr. Libby Maw Toney Reil MD, MD 06/12/2021 11:48:40 AM This report has been signed electronically. Number of Addenda: 0 Note Initiated On: 06/12/2021 10:57 AM Scope Withdrawal Time: 0 hours 16 minutes 40 seconds  Total Procedure Duration: 0 hours 31 minutes 51 seconds  Estimated Blood Loss:  Estimated blood loss was minimal.      [facility]

## 2021-06-12 NOTE — Anesthesia Postprocedure Evaluation (Signed)
Anesthesia Post Note  Patient: Jamie Wilkins  Procedure(s) Performed: COLONOSCOPY GIVENS CAPSULE STUDY  Patient location during evaluation: PACU Anesthesia Type: MAC Level of consciousness: awake and alert Pain management: pain level controlled Vital Signs Assessment: post-procedure vital signs reviewed and stable Respiratory status: spontaneous breathing, nonlabored ventilation, respiratory function stable and patient connected to nasal cannula oxygen Cardiovascular status: stable and blood pressure returned to baseline Postop Assessment: no apparent nausea or vomiting Anesthetic complications: no   No notable events documented.   Last Vitals:  Vitals:   06/12/21 1553 06/12/21 1555  BP: 91/73 100/70  Pulse: 95 97  Resp: 17   Temp: (!) 36.3 C   SpO2: 100%     Last Pain:  Vitals:   06/12/21 1553  TempSrc: Oral  PainSc:                  Tonny Bollman

## 2021-06-12 NOTE — Discharge Summary (Signed)
Jamie Wilkins:[id number] DOB: 1961/05/22 DOA: 06/09/2021  PCP: Veneda Melter, FNP  Admit date: 06/09/2021 Discharge date: 06/12/2021  Time spent: 35 minutes  Recommendations for Outpatient Follow-up:  PCP f/u 1 week, will need check of hemoglobin, potassium, and magnesium  GI follow-up to review biopsy results    Discharge Diagnoses:  Principal Problem:   Symptomatic anemia Active Problems:   Hypokalemia   Hypomagnesemia   Alcoholic hepatitis without ascites   Electrolyte abnormality   Malnutrition of moderate degree   Hyponatremia   AF (paroxysmal atrial fibrillation) (HCC)   Protein-calorie malnutrition, severe   Melena   Discharge Condition: stable  Diet recommendation: regular  Filed Weights   06/10/21 1332 06/10/21 2345 06/12/21 1100  Weight: 59 kg 60.5 kg 60.5 kg    History of present illness:  Jamie Wilkins is a 60 y.o. female with medical history significant of alcohol abuse, essential hypertension, asthma, suspected liver cirrhosis who went to the [place] clinic today with complaint of weakness and vomiting.  Also reported right upper quadrant epigastric and abdominal pain.  Symptoms have been on and off for the last 3 months.  In the last 3 days they have gotten worse.  She has been feeling weak.  Short of breath with little activity.  She has felt occasionally like she was going to pass out.  She denied bright red blood per rectum but has noticed tarry stools on and off over the last 3 months.  Patient was seen in April of this year with hemoglobin about 10.2.  Today in the ER her hemoglobin is down to 4.7.  She has no diarrhea.  Patient has hypokalemia hyponatremia mildly elevated LFTs.  No obvious active bleed at the moment but patient has symptomatic anemia from suspected GI bleed.  She is therefore being admitted to the hospital for further evaluation and treatment.Marland Kitchen  Hospital Course:  # Gastrointestinal bleeding # Acute blood loss anemia #  Colitis Several weeks melena. No hx upper or lower endoscopy. No regular nsaid use. Is a heavy drinker. Hgb 4.7 on arrival, was 10.7 3 months ago. Transfused 3 units, hgb since then stable in the 8s. Upper endoscopy 7/21 unrevealing. Colonoscopy 7/23 showing severe inflammation ascending colon consistent with colitis. Patient given iron transfusion prior to discharge - close pcp f/u to monitor hgb - f/u gi Dr. Norma Fredrickson to review biopsy results. GI did not advise specific therapies at discharge   # Heavy drinker # Alcohol steatosis Cessation counseling advised   # Hypokalemia # Hypokalemia Repleted while here, will need check on this at pcp f/u  Procedures: Upper endoscopy and colonoscopy   Consultations: GI  Discharge Exam: Vitals:   06/12/21 1202 06/12/21 1232  BP:  114/74  Pulse:  67  Resp:  16  Temp:  98.9 F (37.2 C)  SpO2: 95% 97%    General exam: Appears calm and comfortable Respiratory system: Clear to auscultation. Respiratory effort normal. Cardiovascular system: S1 & S2 heard, RRR. No JVD, murmurs, rubs, gallops or clicks. No pedal edema. Gastrointestinal system: Abdomen is nondistended, soft and nontender. No organomegaly or masses felt. Normal bowel sounds heard. Central nervous system: Alert and oriented. No focal neurological deficits. Extremities: Symmetric 5 x 5 power. Skin: No rashes, lesions or ulcers Psychiatry: Judgement and insight appear normal. Mood & affect appropriate.  Discharge Instructions   Discharge Instructions     Diet - low sodium heart healthy   Complete by: As directed    Increase activity slowly  Complete by: As directed       Allergies as of 06/12/2021   No Known Allergies      Medication List     STOP taking these medications    albuterol (2.5 MG/3ML) 0.083% nebulizer solution Commonly known as: PROVENTIL   amLODipine 10 MG tablet Commonly known as: NORVASC   atorvastatin 40 MG tablet Commonly known as: LIPITOR    buPROPion 150 MG 12 hr tablet Commonly known as: WELLBUTRIN SR   buPROPion 150 MG 24 hr tablet Commonly known as: WELLBUTRIN XL   Calcium 600+D 600-400 MG-UNIT Tabs Generic drug: Calcium Carbonate-Vitamin D3   Flovent HFA 110 MCG/ACT inhaler Generic drug: fluticasone   fluticasone 50 MCG/ACT nasal spray Commonly known as: FLONASE   hydrochlorothiazide 25 MG tablet Commonly known as: HYDRODIURIL   ibuprofen 800 MG tablet Commonly known as: ADVIL   losartan 25 MG tablet Commonly known as: COZAAR   montelukast 10 MG tablet Commonly known as: SINGULAIR   omeprazole 40 MG capsule Commonly known as: PRILOSEC   ProAir HFA 108 (90 Base) MCG/ACT inhaler Generic drug: albuterol       TAKE these medications    ferrous sulfate 325 (65 FE) MG tablet Commonly known as: FerrouSul Take 1 tablet (325 mg total) by mouth every other day.       No Known Allergies  Follow-up Information     [place], Boykin Nearingeodoro K, MD. Schedule an appointment as soon as possible for a visit.   Specialty: Gastroenterology Contact information: [address] [place] [id number] [phone]         Your primary care provider Follow up.   Why: in 1 week                 The results of significant diagnostics from this hospitalization (including imaging, microbiology, ancillary and laboratory) are listed below for reference.    Significant Diagnostic Studies: CT ABDOMEN PELVIS W CONTRAST  Result Date: 06/09/2021 CLINICAL DATA:  Nonlocalized acute abdominal pain. Vomiting X 1 month. Shortness of breath on exertion X 2 days. EXAM: CT ABDOMEN AND PELVIS WITH CONTRAST TECHNIQUE: Multidetector CT imaging of the abdomen and pelvis was performed using the standard protocol following bolus administration of intravenous contrast. CONTRAST:  75mL OMNIPAQUE IOHEXOL 300 MG/ML  SOLN COMPARISON:  Ultrasound abdomen 06/09/2021, CT abdomen pelvis 02/15/2021 FINDINGS: Lower chest: No acute  abnormality. Hepatobiliary: The hepatic parenchyma is markedly diffusely hypodense compared to the splenic parenchyma consistent with fatty infiltration. No definite focal liver abnormality. Vague layering hyperdensity within the gallbladder lumen consistent with gallbladder sludge. No biliary dilatation. Pancreas: No focal lesion. Normal pancreatic contour. No surrounding inflammatory changes. No main pancreatic ductal dilatation. Spleen: Similar-appearing subcentimeter hypodensity within the spleen is nonspecific (2:16). Otherwise normal in size without focal abnormality. Adrenals/Urinary Tract: No adrenal nodule bilaterally. Bilateral kidneys enhance symmetrically. Subcentimeter hypodensities are too small to characterize. Possible punctate calcified stone within the left kidney (5:42). No hydronephrosis. No hydroureter. The urinary bladder is unremarkable. Stomach/Bowel: Stomach is within normal limits. No evidence of bowel wall thickening or dilatation. Appendix appears normal. Vascular/Lymphatic: No abdominal aorta or iliac aneurysm. Severe calcified and noncalcified atherosclerotic plaque of the aorta and its branches. No abdominal, pelvic, or inguinal lymphadenopathy. Reproductive: Status post hysterectomy.  No adnexal mass identified. Other: No intraperitoneal free fluid. No intraperitoneal free gas. No organized fluid collection. Musculoskeletal: No abdominal wall hernia or abnormality. No suspicious lytic or blastic osseous lesions. No acute displaced fracture. Multilevel degenerative changes of the  spine. IMPRESSION: 1. Marked hepatic steatosis with limited evaluation for focal hepatic lesion. 2. Gallbladder sludge. No findings suggest acute cholecystitis or choledocholithiasis. 3.  Aortic Atherosclerosis (ICD10-I70.0). Electronically Signed   By: Tish Frederickson M.D.   On: 06/09/2021 15:18   DG Chest Portable 1 View  Result Date: 06/09/2021 CLINICAL DATA:  Weakness and shortness of breath on  exertion for 2 days. EXAM: PORTABLE CHEST 1 VIEW COMPARISON:  CT chest 02/15/2021.  PA and lateral chest 02/14/2021. FINDINGS: The lungs are emphysematous but clear. Heart size is normal. Aortic atherosclerosis noted. No pneumothorax or pleural fluid. No acute or focal bony abnormality. IMPRESSION: No acute disease. Aortic Atherosclerosis (ICD10-I70.0) and Emphysema (ICD10-J43.9). Electronically Signed   By: Drusilla Kanner M.D.   On: 06/09/2021 14:14   US Abdomen Limited RUQ (LIVER/GB)  Result Date: 06/09/2021 CLINICAL DATA:  Right upper quadrant pain for the last month. EXAM: ULTRASOUND ABDOMEN LIMITED RIGHT UPPER QUADRANT COMPARISON:  Right upper quadrant ultrasound dated February 15, 2021. FINDINGS: Gallbladder: Unchanged prominent sludge. No gallstones or wall thickening visualized. No sonographic Murphy sign noted by sonographer. Common bile duct: Diameter: 3 mm, normal. Liver: No focal lesion identified. Unchanged diffusely increased in parenchymal echogenicity. Portal vein is patent on color Doppler imaging with normal direction of blood flow towards the liver. Other: None. IMPRESSION: 1. No acute abnormality. 2. Unchanged gallbladder sludge and hepatic steatosis. Electronically Signed   By: Obie Dredge M.D.   On: 06/09/2021 14:29    Microbiology: Recent Results (from the past 240 hour(s))  Resp Panel by RT-PCR (Flu A&B, Covid) Nasopharyngeal Swab     Status: None   Collection Time: 06/09/21  1:43 PM   Specimen: Nasopharyngeal Swab; Nasopharyngeal(NP) swabs in vial transport medium  Result Value Ref Range Status   SARS Coronavirus 2 by RT PCR NEGATIVE NEGATIVE Final    Comment: (NOTE) SARS-CoV-2 target nucleic acids are NOT DETECTED.  The SARS-CoV-2 RNA is generally detectable in upper respiratory specimens during the acute phase of infection. The lowest concentration of SARS-CoV-2 viral copies this assay can detect is 138 copies/mL. A negative result does not preclude  SARS-Cov-2 infection and should not be used as the sole basis for treatment or other patient management decisions. A negative result may occur with  improper specimen collection/handling, submission of specimen other than nasopharyngeal swab, presence of viral mutation(s) within the areas targeted by this assay, and inadequate number of viral copies(<138 copies/mL). A negative result must be combined with clinical observations, patient history, and epidemiological information. The expected result is Negative.  Fact Sheet for Patients:  BloggerCourse.com  Fact Sheet for Healthcare Providers:  SeriousBroker.it  This test is no t yet approved or cleared by the [place] FDA and  has been authorized for detection and/or diagnosis of SARS-CoV-2 by FDA under an Emergency Use Authorization (EUA). This EUA will remain  in effect (meaning this test can be used) for the duration of the COVID-19 declaration under Section 564(b)(1) of the Act, 21 U.S.C.section 360bbb-3(b)(1), unless the authorization is terminated  or revoked sooner.       Influenza A by PCR NEGATIVE NEGATIVE Final   Influenza B by PCR NEGATIVE NEGATIVE Final    Comment: (NOTE) The Xpert Xpress SARS-CoV-2/FLU/RSV plus assay is intended as an aid in the diagnosis of influenza from Nasopharyngeal swab specimens and should not be used as a sole basis for treatment. Nasal washings and aspirates are unacceptable for Xpert Xpress SARS-CoV-2/FLU/RSV testing.  Fact Sheet for Patients: BloggerCourse.com  Fact Sheet for Healthcare Providers: SeriousBroker.it  This test is not yet approved or cleared by the [place] FDA and has been authorized for detection and/or diagnosis of SARS-CoV-2 by FDA under an Emergency Use Authorization (EUA). This EUA will remain in effect (meaning this test can be used) for the duration of  the COVID-19 declaration under Section 564(b)(1) of the Act, 21 U.S.C. section 360bbb-3(b)(1), unless the authorization is terminated or revoked.  Performed at [facility], [address] [place], [place] [address]   MRSA Next Gen by PCR, Nasal     Status: None   Collection Time: 06/09/21  8:28 PM   Specimen: Nasal Mucosa; Nasal Swab  Result Value Ref Range Status   MRSA by PCR Next Gen NOT DETECTED NOT DETECTED Final    Comment: (NOTE) The GeneXpert MRSA Assay (FDA approved for NASAL specimens only), is one component of a comprehensive MRSA colonization surveillance program. It is not intended to diagnose MRSA infection nor to guide or monitor treatment for MRSA infections. Test performance is not FDA approved in patients less than 51 years old. Performed at [facility], [address] [place], [place] [address]      Labs: Basic Metabolic Panel: Recent Labs  Lab 06/09/21 1343 06/09/21 2116 06/10/21 0851 06/11/21 0529 06/12/21 0409  NA 130*  --  135 135 133*  K 3.1*  --  3.8 3.3* 3.3*  CL 85*  --  94* 99 102  CO2 28  --  25 26 24   GLUCOSE 98  --  80 85 80  BUN 23*  --  20 15 10   CREATININE 1.05*  --  0.88 0.73 0.67  CALCIUM 7.5*  --  7.0* 7.2* 7.6*  MG  --  0.9*  --  1.2* 1.4*   Liver Function Tests: Recent Labs  Lab 06/09/21 1343 06/10/21 0851  AST 53* 43*  ALT 21 18  ALKPHOS 184* 162*  BILITOT 2.3* 3.5*  PROT 5.7* 5.1*  ALBUMIN 2.6* 2.3*   Recent Labs  Lab 06/09/21 1343  LIPASE 78*   No results for input(s): AMMONIA in the last 168 hours. CBC: Recent Labs  Lab 06/09/21 1343 06/09/21 2116 06/10/21 0851 06/10/21 1600 06/11/21 0529 06/12/21 0409  WBC 4.7 4.4 4.6  --  4.5 4.1  NEUTROABS 3.7  --   --   --   --   --   HGB 4.7* 6.1* 8.7* 8.9* 8.5* 8.6*  HCT 14.2* 18.7* 25.0*  --  24.6* 25.4*  MCV 83.0 79.6* 79.4*  --  79.4* 79.4*  PLT 338 251 220  --  199 184   Cardiac Enzymes: No results for input(s): CKTOTAL, CKMB,  CKMBINDEX, TROPONINI in the last 168 hours. BNP: BNP (last 3 results) No results for input(s): BNP in the last 8760 hours.  ProBNP (last 3 results) No results for input(s): PROBNP in the last 8760 hours.  CBG: Recent Labs  Lab 06/09/21 2220  GLUCAP 87       Signed:  06/11/21 MD.  [facility] 06/12/2021, 1:10 PM

## 2021-06-12 NOTE — Progress Notes (Signed)
This Clinical research associate assigned to pt from [id number].  Magnesium and iron infusions completed.  VSS prior to discharge.  Pt has been discharged home.  Discharge papers given and explained to pt.  Pt verbalized understanding.  Meds and f/u appointments reviewed.  Rx sent electronically to the pharmacy.  Pt made aware.

## 2021-06-12 NOTE — Anesthesia Preprocedure Evaluation (Addendum)
Anesthesia Evaluation  Patient identified by MRN, date of birth, ID band Patient awake    Reviewed: Allergy & Precautions, NPO status , Patient's Chart, lab work & pertinent test results  History of Anesthesia Complications Negative for: history of anesthetic complications  Airway Mallampati: II  TM Distance: >3 FB Neck ROM: Full    Dental no notable dental hx.    Pulmonary asthma , Current Smoker and Patient abstained from smoking.,    Pulmonary exam normal breath sounds clear to auscultation       Cardiovascular Exercise Tolerance: Good METS: 3 - Mets hypertension, negative cardio ROS Normal cardiovascular exam Rhythm:Regular Rate:Normal     Neuro/Psych negative neurological ROS  negative psych ROS   GI/Hepatic negative GI ROS, (+)     substance abuse  , Hepatitis -  Endo/Other  negative endocrine ROS  Renal/GU negative Renal ROS  negative genitourinary   Musculoskeletal negative musculoskeletal ROS (+)   Abdominal   Peds  Hematology negative hematology ROS (+)   Anesthesia Other Findings   Reproductive/Obstetrics negative OB ROS                            Anesthesia Physical Anesthesia Plan  ASA: 3 and emergent  Anesthesia Plan: MAC   Post-op Pain Management:    Induction: Intravenous  PONV Risk Score and Plan:   Airway Management Planned: Natural Airway and Nasal Cannula  Additional Equipment:   Intra-op Plan:   Post-operative Plan:   Informed Consent: I have reviewed the patients History and Physical, chart, labs and discussed the procedure including the risks, benefits and alternatives for the proposed anesthesia with the patient or authorized representative who has indicated his/her understanding and acceptance.     Dental Advisory Given  Plan Discussed with: Anesthesiologist, CRNA and Surgeon  Anesthesia Plan Comments: (Patient consented for risks of  anesthesia including but not limited to:  - adverse reactions to medications - damage to eyes, teeth, lips or other oral mucosa - nerve damage due to positioning  - sore throat or hoarseness - Damage to heart, brain, nerves, lungs, other parts of body or loss of life  Patient voiced understanding.)        Anesthesia Quick Evaluation

## 2021-06-14 ENCOUNTER — Encounter: Payer: Self-pay | Admitting: Gastroenterology

## 2021-06-18 LAB — SURGICAL PATHOLOGY

## 2021-07-20 ENCOUNTER — Other Ambulatory Visit: Payer: Self-pay

## 2021-07-20 ENCOUNTER — Emergency Department: Payer: Self-pay

## 2021-07-20 ENCOUNTER — Inpatient Hospital Stay
Admission: EM | Admit: 2021-07-20 | Discharge: 2021-07-21 | DRG: 641 | Disposition: A | Discharge status: Home / Self Care | Payer: Self-pay | Attending: Internal Medicine | Admitting: Internal Medicine

## 2021-07-20 DIAGNOSIS — Z90710 Acquired absence of both cervix and uterus: Secondary | ICD-10-CM

## 2021-07-20 DIAGNOSIS — E860 Dehydration: Principal | ICD-10-CM | POA: Diagnosis present

## 2021-07-20 DIAGNOSIS — J90 Pleural effusion, not elsewhere classified: Secondary | ICD-10-CM | POA: Diagnosis present

## 2021-07-20 DIAGNOSIS — Z20822 Contact with and (suspected) exposure to covid-19: Secondary | ICD-10-CM | POA: Diagnosis present

## 2021-07-20 DIAGNOSIS — R911 Solitary pulmonary nodule: Secondary | ICD-10-CM | POA: Diagnosis present

## 2021-07-20 DIAGNOSIS — K7010 Alcoholic hepatitis without ascites: Secondary | ICD-10-CM | POA: Diagnosis present

## 2021-07-20 DIAGNOSIS — F17210 Nicotine dependence, cigarettes, uncomplicated: Secondary | ICD-10-CM | POA: Diagnosis present

## 2021-07-20 DIAGNOSIS — R7989 Other specified abnormal findings of blood chemistry: Secondary | ICD-10-CM

## 2021-07-20 DIAGNOSIS — L89152 Pressure ulcer of sacral region, stage 2: Secondary | ICD-10-CM | POA: Diagnosis present

## 2021-07-20 DIAGNOSIS — E876 Hypokalemia: Secondary | ICD-10-CM | POA: Diagnosis present

## 2021-07-20 DIAGNOSIS — Y92009 Unspecified place in unspecified non-institutional (private) residence as the place of occurrence of the external cause: Secondary | ICD-10-CM

## 2021-07-20 DIAGNOSIS — Z8249 Family history of ischemic heart disease and other diseases of the circulatory system: Secondary | ICD-10-CM

## 2021-07-20 DIAGNOSIS — W1789XA Other fall from one level to another, initial encounter: Secondary | ICD-10-CM | POA: Diagnosis present

## 2021-07-20 DIAGNOSIS — I4891 Unspecified atrial fibrillation: Secondary | ICD-10-CM | POA: Diagnosis present

## 2021-07-20 DIAGNOSIS — D6489 Other specified anemias: Secondary | ICD-10-CM | POA: Diagnosis present

## 2021-07-20 DIAGNOSIS — W19XXXA Unspecified fall, initial encounter: Secondary | ICD-10-CM

## 2021-07-20 DIAGNOSIS — I959 Hypotension, unspecified: Secondary | ICD-10-CM | POA: Diagnosis present

## 2021-07-20 DIAGNOSIS — N179 Acute kidney failure, unspecified: Secondary | ICD-10-CM | POA: Diagnosis present

## 2021-07-20 DIAGNOSIS — I480 Paroxysmal atrial fibrillation: Secondary | ICD-10-CM | POA: Diagnosis present

## 2021-07-20 DIAGNOSIS — E8809 Other disorders of plasma-protein metabolism, not elsewhere classified: Secondary | ICD-10-CM | POA: Diagnosis present

## 2021-07-20 DIAGNOSIS — F1010 Alcohol abuse, uncomplicated: Secondary | ICD-10-CM | POA: Diagnosis present

## 2021-07-20 DIAGNOSIS — R778 Other specified abnormalities of plasma proteins: Secondary | ICD-10-CM

## 2021-07-20 DIAGNOSIS — I10 Essential (primary) hypertension: Secondary | ICD-10-CM | POA: Diagnosis present

## 2021-07-20 DIAGNOSIS — R296 Repeated falls: Secondary | ICD-10-CM | POA: Diagnosis present

## 2021-07-20 LAB — CBC WITH DIFFERENTIAL/PLATELET
Abs Immature Granulocytes: 0.03 10*3/uL (ref 0.00–0.07)
Basophils Absolute: 0 10*3/uL (ref 0.0–0.1)
Basophils Relative: 0 %
Eosinophils Absolute: 0 10*3/uL (ref 0.0–0.5)
Eosinophils Relative: 0 %
HCT: 29.3 % — ABNORMAL LOW (ref 36.0–46.0)
Hemoglobin: 9.7 g/dL — ABNORMAL LOW (ref 12.0–15.0)
Immature Granulocytes: 0 %
Lymphocytes Relative: 17 %
Lymphs Abs: 1.2 10*3/uL (ref 0.7–4.0)
MCH: 29.7 pg (ref 26.0–34.0)
MCHC: 33.1 g/dL (ref 30.0–36.0)
MCV: 89.6 fL (ref 80.0–100.0)
Monocytes Absolute: 0.8 10*3/uL (ref 0.1–1.0)
Monocytes Relative: 10 %
Neutro Abs: 5.2 10*3/uL (ref 1.7–7.7)
Neutrophils Relative %: 73 %
Platelets: 220 10*3/uL (ref 150–400)
RBC: 3.27 MIL/uL — ABNORMAL LOW (ref 3.87–5.11)
RDW: 27.3 % — ABNORMAL HIGH (ref 11.5–15.5)
Smear Review: NORMAL
WBC: 7.2 10*3/uL (ref 4.0–10.5)
nRBC: 0.3 % — ABNORMAL HIGH (ref 0.0–0.2)

## 2021-07-20 LAB — COMPREHENSIVE METABOLIC PANEL
ALT: 17 U/L (ref 0–44)
AST: 59 U/L — ABNORMAL HIGH (ref 15–41)
Albumin: 2 g/dL — ABNORMAL LOW (ref 3.5–5.0)
Alkaline Phosphatase: 288 U/L — ABNORMAL HIGH (ref 38–126)
Anion gap: 14 (ref 5–15)
BUN: 13 mg/dL (ref 8–23)
CO2: 28 mmol/L (ref 22–32)
Calcium: 6.5 mg/dL — ABNORMAL LOW (ref 8.9–10.3)
Chloride: 93 mmol/L — ABNORMAL LOW (ref 98–111)
Creatinine, Ser: 1.04 mg/dL — ABNORMAL HIGH (ref 0.44–1.00)
GFR, Estimated: >60 mL/min (ref >60)
Glucose, Bld: 104 mg/dL — ABNORMAL HIGH (ref 70–99)
Potassium: 2.6 mmol/L — CL (ref 3.5–5.1)
Sodium: 135 mmol/L (ref 135–145)
Total Bilirubin: 2.2 mg/dL — ABNORMAL HIGH (ref 0.3–1.2)
Total Protein: 5.7 g/dL — ABNORMAL LOW (ref 6.5–8.1)

## 2021-07-20 LAB — TROPONIN I (HIGH SENSITIVITY)
Troponin I (High Sensitivity): 28 ng/L — ABNORMAL HIGH (ref <18)
Troponin I (High Sensitivity): 20 ng/L — ABNORMAL HIGH (ref <18)

## 2021-07-20 LAB — BRAIN NATRIURETIC PEPTIDE: B Natriuretic Peptide: 352.7 pg/mL — ABNORMAL HIGH (ref 0.0–100.0)

## 2021-07-20 LAB — D-DIMER, QUANTITATIVE: D-Dimer, Quant: 2.9 ug/mL-FEU — ABNORMAL HIGH (ref 0.00–0.50)

## 2021-07-20 LAB — MAGNESIUM: Magnesium: 0.9 mg/dL — CL (ref 1.7–2.4)

## 2021-07-20 LAB — RESP PANEL BY RT-PCR (FLU A&B, COVID) ARPGX2
Influenza A by PCR: NEGATIVE
Influenza B by PCR: NEGATIVE
SARS Coronavirus 2 by RT PCR: NEGATIVE

## 2021-07-20 LAB — PHOSPHORUS: Phosphorus: 1.5 mg/dL — ABNORMAL LOW (ref 2.5–4.6)

## 2021-07-20 MED ORDER — POTASSIUM CHLORIDE CRYS ER 20 MEQ PO TBCR
40.0000 meq | EXTENDED_RELEASE_TABLET | Freq: Once | ORAL | Status: AC
  Administered 2021-07-20: 40 meq via ORAL
  Filled 2021-07-20: qty 2

## 2021-07-20 MED ORDER — POTASSIUM CHLORIDE 10 MEQ/100ML IV SOLN
10.0000 meq | Freq: Once | INTRAVENOUS | Status: AC
  Administered 2021-07-20: 10 meq via INTRAVENOUS
  Filled 2021-07-20 (×2): qty 100

## 2021-07-20 MED ORDER — CALCIUM CARBONATE 1250 (500 CA) MG PO TABS
1250.0000 mg | ORAL_TABLET | Freq: Three times a day (TID) | ORAL | Status: DC
  Administered 2021-07-21 (×3): 1250 mg via ORAL
  Filled 2021-07-20 (×4): qty 1

## 2021-07-20 MED ORDER — MAGNESIUM HYDROXIDE 400 MG/5ML PO SUSP: 30.0000 mL | Freq: Every day | ORAL | Status: DC | PRN

## 2021-07-20 MED ORDER — MAGNESIUM SULFATE 4 GM/100ML IV SOLN
4.0000 g | Freq: Once | INTRAVENOUS | Status: AC
  Administered 2021-07-20: 4 g via INTRAVENOUS
  Filled 2021-07-20: qty 100

## 2021-07-20 MED ORDER — POTASSIUM CHLORIDE 10 MEQ/100ML IV SOLN
10.0000 meq | Freq: Once | INTRAVENOUS | Status: AC
  Administered 2021-07-20: 10 meq via INTRAVENOUS
  Filled 2021-07-20: qty 100

## 2021-07-20 MED ORDER — TRAZODONE HCL 50 MG PO TABS: 25.0000 mg | ORAL_TABLET | Freq: Every evening | ORAL | Status: DC | PRN

## 2021-07-20 MED ORDER — ACETAMINOPHEN 325 MG PO TABS: 650.0000 mg | ORAL_TABLET | Freq: Four times a day (QID) | ORAL | Status: DC | PRN

## 2021-07-20 MED ORDER — IOHEXOL 350 MG/ML SOLN
75.0000 mL | Freq: Once | INTRAVENOUS | Status: AC | PRN
  Administered 2021-07-20: 75 mL via INTRAVENOUS

## 2021-07-20 MED ORDER — POTASSIUM PHOSPHATES 15 MMOLE/5ML IV SOLN
20.0000 mmol | Freq: Once | INTRAVENOUS | Status: AC
  Administered 2021-07-20: 20 mmol via INTRAVENOUS
  Filled 2021-07-20: qty 6.67

## 2021-07-20 MED ORDER — ASPIRIN 81 MG PO CHEW
324.0000 mg | CHEWABLE_TABLET | Freq: Once | ORAL | Status: AC
  Administered 2021-07-20: 324 mg via ORAL
  Filled 2021-07-20: qty 4

## 2021-07-20 MED ORDER — CALCIUM GLUCONATE-NACL 1-0.675 GM/50ML-% IV SOLN
1.0000 g | Freq: Once | INTRAVENOUS | Status: AC
  Administered 2021-07-20: 1000 mg via INTRAVENOUS
  Filled 2021-07-20: qty 50

## 2021-07-20 MED ORDER — ONDANSETRON HCL 4 MG PO TABS: 4.0000 mg | ORAL_TABLET | Freq: Four times a day (QID) | ORAL | Status: DC | PRN

## 2021-07-20 MED ORDER — ONDANSETRON HCL 4 MG/2ML IJ SOLN: 4.0000 mg | Freq: Four times a day (QID) | INTRAMUSCULAR | Status: DC | PRN

## 2021-07-20 MED ORDER — ACETAMINOPHEN 650 MG RE SUPP: 650.0000 mg | Freq: Four times a day (QID) | RECTAL | Status: DC | PRN

## 2021-07-20 MED ORDER — MAGNESIUM SULFATE 50 % IJ SOLN: 6.0000 g | Freq: Once | INTRAVENOUS | Status: DC

## 2021-07-20 MED ORDER — POTASSIUM CHLORIDE IN NACL 40-0.9 MEQ/L-% IV SOLN
INTRAVENOUS | Status: DC
  Filled 2021-07-20 (×2): qty 1000

## 2021-07-20 MED ORDER — ENOXAPARIN SODIUM 40 MG/0.4ML IJ SOSY
40.0000 mg | PREFILLED_SYRINGE | INTRAMUSCULAR | Status: DC
  Administered 2021-07-20: 40 mg via SUBCUTANEOUS
  Filled 2021-07-20: qty 0.4

## 2021-07-20 MED ORDER — ADENOSINE 6 MG/2ML IV SOLN
6.0000 mg | Freq: Once | INTRAVENOUS | Status: DC
  Filled 2021-07-20: qty 2

## 2021-07-20 MED ORDER — SODIUM CHLORIDE 0.9 % IV SOLN: INTRAVENOUS | Status: DC

## 2021-07-20 NOTE — ED Triage Notes (Signed)
Pt arrives via ems from home. EMS reports fell this morning c/o CP and dizziness when she fell. EMS reports pt found to be in uncontrolled afib 120-160. SPO2 90% RA. BP 88 systolic. A&ox4. No hx of afib, no thinners. 20 in rt ac. 500 ml NS administered. On 2L oxygen. Afib now between 80-120. O2 increased to 96% on 2L Jamie Wilkins.

## 2021-07-20 NOTE — Discharge Instructions (Addendum)
Please follow-up with your primary care doctor tomorrow if you are able and return immediately to the emergency room if you change your mind and wish to receive treatment.

## 2021-07-20 NOTE — ED Notes (Signed)
Pt given blankets per request.

## 2021-07-20 NOTE — ED Notes (Signed)
Pt given sandwhich tray per request with water

## 2021-07-20 NOTE — ED Notes (Signed)
MD Smith at bedside.

## 2021-07-20 NOTE — ED Notes (Signed)
Pt NAD but states its hard to move because her chest hurts. A/ox4, speaking in full and complete sentences. Pt states she fell once today and has been having substernal gas pain since waking this a.m. pt denies dizziness, nausea, SOB or palpitations. Per EMS, pt was hypotensive at scene and was given NS with improvement to BP. Pt has weak peripheral pulses.

## 2021-07-20 NOTE — ED Notes (Signed)
Pt HR converts to SVT. MD Malinda made aware. Crash cart moved to room, pads placed on pt, adenosine on standby.

## 2021-07-20 NOTE — ED Notes (Addendum)
Pt converts back to A-fib 80-120, remain asymptomatic

## 2021-07-20 NOTE — ED Notes (Signed)
Pt states she will stay overnight for 1 night only. MD Katrinka Blazing made aware

## 2021-07-20 NOTE — ED Triage Notes (Signed)
See first nurse note. Pt reports fall today after tripping over step, denies hitting head or LOC Reports centralized cp that started this am, states it is gas pains.  Denies shob, n/v  EKG a fib with rate of 118 20 g to Right AC PTA

## 2021-07-20 NOTE — ED Notes (Signed)
Pt brief changed.  

## 2021-07-20 NOTE — ED Notes (Signed)
Patient transported to CT 

## 2021-07-20 NOTE — ED Provider Notes (Addendum)
I assumed care of this patient approximately 1500.  Please see all providers note for full details guarding patient's initial evaluation assessment.  In brief patient presents after a fall today with some chronic pain in her substernal and right chest area that she describes as consistent with gas pain is not any different today.  Has been there for 5 or 6 hours today.  She was recently admitted for acute blood loss anemia with unclear source at that time was found to have A. fib RVR.  On arrival to emergency room today she was in A. fib with RVR with rates in the 120s to 160s which seemed to improve with fluids and some potassium repletion started by off going provider.  She is borderline hypotensive as well with systolics around 85-88 and she was given some IV fluids.  Patient states she is not sure why she fell at my assessment.  She states that her discomfort in her chest is not actually chest pain but just some pressure from gas and she seems to be minimizing this to a significant amount.  She states she has not had any alcohol in about 2 weeks.  Although she is not sure why she is fallen..  Initial work-up showing A. fib with RVR and ECG.  Troponin is elevated at 28.  Museum is 0.9.  Phosphorus is 1.5.  CMP remarkable for K of 2.6 and some chronic appearing elevated alk phos and T bili.  T bili of 2.2 today as compared to 3.5 months ago.  In addition patient has no new abdominal pain history or tenderness on exam to suggest acute cholestatic process.  BNP slightly elevated at 352 although globally patient does not appear volume overloaded.  Chest x-ray shows no acute focal consolidation, overt edema, pneumothorax or other clear acute process.  Plan is to follow-up repeat troponin and reassess and likely admit given unclear fall with patient to be in A. fib with RVR not currently rate controlled on outpatient basis although it seem seems to heart rate has improved with just some fluids and potassium.  We  will add magnesium and phosphorus as well as some p.o. potassium.  We will also do D-dimer to assess patient's risk for possible PE contributing to fall and right-sided chest discomfort.  D-dimer is elevated and CTA ordered.  CTA remarkable for no evidence of PE.  There is small effusions with bilateral atelectasis.  There is evidence of hepatic steatosis and aortic atherosclerosis.  There is also a pulmonary nodule.  Given severity of electrolyte derangements and elevated troponin as well as A. fib with RVR with patient not anticoagulated on rate control at home and unclear etiology for fall I will admit to medicine service for further evaluation and management.   Lucrezia Starch, MD 07/20/21 1816    Lucrezia Starch, MD 07/20/21 2019

## 2021-07-20 NOTE — ED Notes (Signed)
Pt states she wants to go home and not stay admitted to hospital. MD Smith at bedside, aware, pt agrees to stay for electrolyte replacement, d-dimer and CTA 

## 2021-07-20 NOTE — ED Notes (Signed)
Pt return from CT.

## 2021-07-20 NOTE — H&P (Addendum)
[place]   PATIENT NAME: Jamie Wilkins    MR#:  [id number]  DATE OF BIRTH:  November 02, 1961  DATE OF ADMISSION:  07/20/2021  PRIMARY CARE PHYSICIAN: [facility] [place]   Patient is coming from: Home  REQUESTING/REFERRING PHYSICIAN: Hulan Saas, MD  CHIEF COMPLAINT:   Chief Complaint  Patient presents with  . Fall  . Chest Pain    HISTORY OF PRESENT ILLNESS:  Jamie Wilkins is a 60 y.o. female with medical history significant for asthma, hypertension, atrial fibrillation not on anticoagulation likely from fall risk, alcohol hepatitis and ongoing alcohol abuse, who presented to the emergency room with acute onset of fall.  The patient stated that her legs give way.  She has been feeling generally weak.  She denies any head injury or presyncope or syncope.  No paresthesias or focal muscle weakness.  She admits to chest pain that felt like gas pain per her report.  No nausea or vomiting or diaphoresis.  No leg pain or edema or recent travels or surgeries.  No cough or wheezing or hemoptysis.  No bleeding diathesis.  Her last alcoholic drink was 2 weeks ago but she is not sure as she quit for good.  No orthopnea or dyspnea on exertion or paroxysmal nocturnal dyspnea.  ED Course: When she came to the ER heart rate was 118 and otherwise vital signs were within normal.  Labs were remarkable for hypokalemia of 2.6 and hypocalcemia of 6.5 with albumin of 2 though, and top of T9 5.7.  Total bili was 2.2 and alk phos 288 with AST 59 and normal ALT of 17.  BNP was 352.7 and high-sensitivity troponin I was 20.  CBC showed anemia with hemoglobin of 9.7 hematocrit 29.3 compared to 8.6 and 25.4 on 06/12/2021.  High-sensitivity troponin I was 28 and later 20.  Quantitative D-dimer was 2.9.  EKG as reviewed by me : Showed atrial fibrillation with rapid ventricular response of 118 with PVCs. Imaging: Portable chest x-ray showed no acute cardiopulmonary disease.  Chest CTA showed no  evidence for PE.  Showed small effusions and basilar atelectasis, marked hepatic steatosis, aortic atherosclerosis and scattered small pulmonary nodules less than 5 mm which are stable from prior study.  The patient was given 4 baby aspirin, 1 g of IV calcium gluconate, 4 g of IV magnesium sulfate, 20 mEq of IV potassium chloride, and 40 mEq p.o. potassium chloride as well as 20 MM0L of potassium phosphate.  The patient will be admitted to a progressive unit bed for further evaluation and management. PAST MEDICAL HISTORY:   Past Medical History:  Diagnosis Date  . Asthma   . Heavy menstrual period   . Hypertension     PAST SURGICAL HISTORY:   Past Surgical History:  Procedure Laterality Date  . ABDOMINAL HYSTERECTOMY     partial  . COLONOSCOPY N/A 06/12/2021   Procedure: COLONOSCOPY;  Surgeon: Lin Landsman, MD;  Location: [facility] ENDOSCOPY;  Service: Gastroenterology;  Laterality: N/A;  . ESOPHAGOGASTRODUODENOSCOPY N/A 06/10/2021   Procedure: ESOPHAGOGASTRODUODENOSCOPY (EGD);  Surgeon: Toledo, Benay Pike, MD;  Location: [facility] ENDOSCOPY;  Service: Gastroenterology;  Laterality: N/A;  . FOOT SURGERY    . GIVENS CAPSULE STUDY N/A 06/12/2021   Procedure: GIVENS CAPSULE STUDY;  Surgeon: Lin Landsman, MD;  Location: [facility] ENDOSCOPY;  Service: Gastroenterology;  Laterality: N/A;  . S/P BTL      SOCIAL HISTORY:   Social History   Tobacco Use  . Smoking  status: Every Day    Packs/day: 0.50    Types: Cigarettes  . Smokeless tobacco: Never  Substance Use Topics  . Alcohol use: Yes    Alcohol/week: 7.0 standard drinks    Types: 7 Standard drinks or equivalent per week    FAMILY HISTORY:   Positive for CVA and cancer in her brother and diabetes mellitus and peripheral vascular disease in her mother and brother.   DRUG ALLERGIES:  No Known Allergies  REVIEW OF SYSTEMS:   ROS As per history of present illness. All pertinent systems were reviewed above. Constitutional, HEENT,  cardiovascular, respiratory, GI, GU, musculoskeletal, neuro, psychiatric, endocrine, integumentary and hematologic systems were reviewed and are otherwise negative/unremarkable except for positive findings mentioned above in the HPI.   MEDICATIONS AT HOME:   Prior to Admission medications   Medication Sig Start Date End Date Taking? Authorizing Provider  ferrous sulfate (FERROUSUL) 325 (65 FE) MG tablet Take 1 tablet (325 mg total) by mouth every other day. 06/12/21   Wouk, Ailene Rud, MD      VITAL SIGNS:  Blood pressure 100/67, pulse (!) 121, temperature 98 F (36.7 C), temperature source Oral, resp. rate 16, height '5\' 7"'  (1.702 m), weight 71.7 kg, last menstrual period 05/11/2010, SpO2 95 %.  PHYSICAL EXAMINATION:  Physical Exam  GENERAL:  60 y.o.-year-old African-American female patient lying in the bed with no acute distress.  EYES: Pupils equal, round, reactive to light and accommodation. No scleral icterus. Extraocular muscles intact.  HEENT: Head atraumatic, normocephalic. Oropharynx and nasopharynx clear.  NECK:  Supple, no jugular venous distention. No thyroid enlargement, no tenderness.  LUNGS: Normal breath sounds bilaterally, no wheezing, rales,rhonchi or crepitation. No use of accessory muscles of respiration.  CARDIOVASCULAR: Irregularly irregular rhythm, S1, S2 normal. No murmurs, rubs, or gallops.  ABDOMEN: Soft, nondistended, nontender. Bowel sounds present. No organomegaly or mass.  EXTREMITIES: No pedal edema, cyanosis, or clubbing.  NEUROLOGIC: Cranial nerves II through XII are intact. Muscle strength 5/5 in all extremities. Sensation intact. Gait not checked.  PSYCHIATRIC: The patient is alert and oriented x 3.  Normal affect and good eye contact. SKIN: No obvious rash, lesion, or ulcer.   LABORATORY PANEL:   CBC Recent Labs  Lab 07/20/21 1242  WBC 7.2  HGB 9.7*  HCT 29.3*  PLT 220    ------------------------------------------------------------------------------------------------------------------  Chemistries  Recent Labs  Lab 07/20/21 1237 07/20/21 1242  NA  --  135  K  --  2.6*  CL  --  93*  CO2  --  28  GLUCOSE  --  104*  BUN  --  13  CREATININE  --  1.04*  CALCIUM  --  6.5*  MG 0.9*  --   AST  --  59*  ALT  --  17  ALKPHOS  --  288*  BILITOT  --  2.2*   ------------------------------------------------------------------------------------------------------------------  Cardiac Enzymes No results for input(s): TROPONINI in the last 168 hours. ------------------------------------------------------------------------------------------------------------------  RADIOLOGY:  CT Angio Chest PE W and/or Wo Contrast  Result Date: 07/20/2021 CLINICAL DATA:  Suspect pulmonary embolism in a 60 year old female. EXAM: CT ANGIOGRAPHY CHEST WITH CONTRAST TECHNIQUE: Multidetector CT imaging of the chest was performed using the standard protocol during bolus administration of intravenous contrast. Multiplanar CT image reconstructions and MIPs were obtained to evaluate the vascular anatomy. CONTRAST:  98m OMNIPAQUE IOHEXOL 350 MG/ML SOLN COMPARISON:  February 15, 2021. FINDINGS: Cardiovascular: The aorta shows normal caliber with scattered calcified atheromatous plaque. Heart size is normal  with trace pericardial effusion. Main pulmonary artery density is 331 Hounsfield units. No sign of pulmonary embolism. Subsegmental level mildly limited by respiratory motion at the lung bases and in the mid chest. Mediastinum/Nodes: Esophagus grossly normal. Mildly patulous in the midportion. Thoracic inlet structures are unremarkable. No axillary lymphadenopathy. The no thoracic inlet lymphadenopathy. No mediastinal lymphadenopathy. No hilar lymphadenopathy. Lungs/Pleura: Small bilateral pleural effusions layering dependently. No sign of dense consolidation, mild basilar atelectasis. Small  RIGHT lower lobe pulmonary nodule (image 61/6) 4 mm is unchanged since March. Tiny RIGHT lower lobe pulmonary nodule along the medial aspect of the RIGHT lower lobe 3 mm (image 60/6) also unchanged. No sign of pneumothorax.  Airways are patent. Small 3-4 mm nodules in the RIGHT middle lobe also show a similar appearance. Upper Abdomen: Marked hepatic steatosis likely worse than on the previous exam though difficult to quantify across these 2 evaluations. Liver is incompletely imaged. Imaged portions of adrenal glands, spleen, pancreas and RIGHT and LEFT kidney unremarkable. Imaged portions of the gastrointestinal tract likewise with very limited assessment without acute process. Musculoskeletal: No acute musculoskeletal finding. Spinal degenerative changes. Review of the MIP images confirms the above findings. IMPRESSION: Negative for pulmonary embolism. Mildly limited assessment at the subsegmental level. Small effusions and basilar atelectasis. Marked hepatic steatosis, potentially worsening over a series of prior studies. Correlate with any signs of liver disease. Aortic atherosclerosis. Scattered small pulmonary nodules less than 5 mm, stable since the prior study. No follow-up needed if patient is low-risk (and has no known or suspected primary neoplasm). Non-contrast chest CT can be considered in 12 months, from the March of 2022 evaluation, if patient is high-risk. This recommendation follows the consensus statement: Guidelines for Management of Incidental Pulmonary Nodules Detected on CT Images: From the Fleischner Society 2017; Radiology 2017; 284:228-243. Electronically Signed   By: Zetta Bills M.D.   On: 07/20/2021 19:28   DG Chest Portable 1 View  Result Date: 07/20/2021 CLINICAL DATA:  Chest pain, fall. EXAM: PORTABLE CHEST 1 VIEW COMPARISON:  Exam from June 09, 2021. FINDINGS: Findings EKG leads project over the lower chest. Trachea is midline. Cardiomediastinal contours and hilar structures are  normal. Lungs are clear. No visible pneumothorax. No sign of pleural effusion on frontal radiograph. On limited assessment no acute skeletal process. IMPRESSION: No acute cardiopulmonary disease. Electronically Signed   By: Zetta Bills M.D.   On: 07/20/2021 13:19      IMPRESSION AND PLAN:  Active Problems:   Atrial fibrillation with RVR (HCC)  1.  Mechanical fall with presyncope or syncope.  This could be secondary to atrial fibrillation with rapid response,  orthostatic hypotension, severe hypokalemia and hypomagnesemia and hypocalcemia. - The patient will be admitted to a progressive unit bed. - Fall precautions will be provided. - Physical therapy evaluation will be obtained to assess her ambulation. - Aggressive potassium, magnesium and corrected calcium replacement will be provided. - We will check orthostatics.  2.  Paroxysmal atrial fibrillation with rapid ventricular response.  This could be contributing to #1. - This likely secondary to significant electrolyte abnormalities and severe hypokalemia and hypomagnesemia. - Optimize potassium and magnesium with a target of potassium more than 4 and magnesium more than 2. - The patient may not need rate control after adequate electrolyte replacement. - Her CHA2DS2-VASc score is 2 however the patient is clearly a fall risk patient with a history of alcohol abuse and therefore she is not a candidate for anticoagulation. - Cardiology consult will be  obtained. - I notified Dr. Aundra Dubin about the patient.  3.  Elevated BNP however with negative chest x-ray and only mild effusions on CT without pulmonary edema. - The patient had a 2D echo in March of this year revealing an EF of 50-55 with normal diastolic function.  It showed mild to moderate mitral regurgitation. - The patient has no orthopnea or dyspnea on exertion or paroxysmal nocturnal dyspnea. - Elevated BNP could be secondary to related to atrial fibrillation with rapid ventricular  response.  4.  Anemia, stable and actually better than previous levels. - We will follow H&H.  DVT prophylaxis: Lovenox prophylactic dose.  Code Status: full code. Family Communication:  The plan of care was discussed in details with the patient (and family). I answered all questions. The patient agreed to proceed with the above mentioned plan. Further management will depend upon hospital course. Disposition Plan: Back to previous home environment Consults called: Cardiology.   All the records are reviewed and case discussed with ED provider.  Status is: Inpatient  Remains inpatient appropriate because:Ongoing diagnostic testing needed not appropriate for outpatient work up, Unsafe d/c plan, IV treatments appropriate due to intensity of illness or inability to take PO, and Inpatient level of care appropriate due to severity of illness  Dispo: The patient is from: Home              Anticipated d/c is to: Home              Patient currently is not medically stable to d/c.   Difficult to place patient No   TOTAL TIME TAKING CARE OF THIS PATIENT: 55 minutes.    Christel Mormon M.D on 07/20/2021 at 8:37 PM  [facility]   From 7 PM-7 AM, contact night-coverage [email or web address]  CC: Primary care physician; [facility], [facility]

## 2021-07-20 NOTE — ED Notes (Signed)
Pt given ice cream per request.  

## 2021-07-20 NOTE — ED Provider Notes (Signed)
[facility] Emergency Department Provider Note   ____________________________________________   Event Date/Time   First MD Initiated Contact with Patient 07/20/21 1227     (approximate)  I have reviewed the triage vital signs and the nursing notes.   HISTORY  Chief Complaint Fall and Chest Pain   HPI Jamie Wilkins is a 60 y.o. female who reports she has frequent episodes of chest pain which get better when she passes gas.  She woke up this morning with the same pain is always in her chest is been there for 5 or 6 hours now.  It is not worse with walking or other exertion.  She is does say that when she walks her legs get weak and can give out on her.  She does not have any pain in her legs with walking.  EMS reports that she got dizzy or weak this morning while standing and EMS was called.  They found uncontrolled A. fib at a rate of 1 20-1 60 and O2 sats of 90%.  Patient's blood pressure was 88 systolic.  They gave her I believe it was 500 cc of fluid and put on oxygen.  Now she has an EKG showing A. fib at a rate of 118 monitor goes down to 100 at times.  Patient reports her pain is the same as it always is.  She does not again have pain in her legs when she walks work currently.  Blood pressure currently is 92/71.         Past Medical History:  Diagnosis Date   Asthma    Heavy menstrual period    Hypertension     Patient Active Problem List   Diagnosis Date Noted   Colitis 06/12/2021   Melena    AF (paroxysmal atrial fibrillation) (HCC) 06/10/2021   Protein-calorie malnutrition, severe 06/10/2021   Hyponatremia 06/09/2021   Symptomatic anemia 06/09/2021   Malnutrition of moderate degree 02/17/2021   Syncope    Elevated lipase    Electrolyte abnormality    Chest pain 02/15/2021   Hypocalcemia    Hypokalemia    Hypomagnesemia    Alcoholic hepatitis without ascites     Past Surgical History:  Procedure Laterality Date   ABDOMINAL  HYSTERECTOMY     partial   COLONOSCOPY N/A 06/12/2021   Procedure: COLONOSCOPY;  Surgeon: Toney Reil, MD;  Location: [facility] ENDOSCOPY;  Service: Gastroenterology;  Laterality: N/A;   ESOPHAGOGASTRODUODENOSCOPY N/A 06/10/2021   Procedure: ESOPHAGOGASTRODUODENOSCOPY (EGD);  Surgeon: Toledo, Boykin Nearing, MD;  Location: [facility] ENDOSCOPY;  Service: Gastroenterology;  Laterality: N/A;   FOOT SURGERY     GIVENS CAPSULE STUDY N/A 06/12/2021   Procedure: GIVENS CAPSULE STUDY;  Surgeon: Toney Reil, MD;  Location: [facility] ENDOSCOPY;  Service: Gastroenterology;  Laterality: N/A;   S/P BTL      Prior to Admission medications   Medication Sig Start Date End Date Taking? Authorizing Provider  ferrous sulfate (FERROUSUL) 325 (65 FE) MG tablet Take 1 tablet (325 mg total) by mouth every other day. 06/12/21   Wouk, Wilfred Curtis, MD    Allergies Patient has no known allergies.  No family history on file.  Social History Social History   Tobacco Use   Smoking status: Every Day    Packs/day: 0.50    Types: Cigarettes   Smokeless tobacco: Never  Vaping Use   Vaping Use: Never used  Substance Use Topics   Alcohol use: Yes    Alcohol/week: 7.0 standard drinks  Types: 7 Standard drinks or equivalent per week   Drug use: Never    Review of Systems  Constitutional: No fever/chills Eyes: No visual changes. ENT: No sore throat. Cardiovascular: chest pain. Respiratory: Denies shortness of breath. Gastrointestinal: No abdominal pain.  No nausea, no vomiting.  No diarrhea.  No constipation. Genitourinary: Negative for dysuria. Musculoskeletal: Negative for back pain. Skin: Negative for rash. Neurological: Negative for headaches, focal weakness   ____________________________________________   PHYSICAL EXAM:  VITAL SIGNS: ED Triage Vitals  Enc Vitals Group     BP 07/20/21 1218 96/66     Pulse Rate 07/20/21 1218 (!) 118     Resp 07/20/21 1218 20     Temp 07/20/21 1218 98 F (36.7  C)     Temp Source 07/20/21 1218 Oral     SpO2 07/20/21 1218 100 %     Weight 07/20/21 1216 158 lb (71.7 kg)     Height 07/20/21 1216 5\' 7"  (1.702 m)     Head Circumference --      Peak Flow --      Pain Score 07/20/21 1215 9     Pain Loc --      Pain Edu? --      Excl. in GC? --     Constitutional: Alert and oriented. Well appearing and in no acute distress. Eyes: Conjunctivae are normal. PER EOMI. Head: Atraumatic. Nose: No congestion/rhinnorhea. Mouth/Throat: Mucous membranes are moist.  Oropharynx non-erythematous. Neck: No stridor.   Cardiovascular: Normal rate, regular rhythm. Grossly normal heart sounds.  Good peripheral circulation. Respiratory: Normal respiratory effort.  No retractions. Lungs CTAB. Gastrointestinal: Soft and nontender. No distention. No abdominal bruits. No CVA tenderness. Musculoskeletal: No lower extremity tenderness slight trace bilateral edema.   Neurologic:  Normal speech and language. No gross focal neurologic deficits are appreciated.  Skin:  Skin is warm, dry and intact. No rash noted.   ____________________________________________   LABS (all labs ordered are listed, but only abnormal results are displayed)  Labs Reviewed  COMPREHENSIVE METABOLIC PANEL - Abnormal; Notable for the following components:      Result Value   Potassium 2.6 (*)    Chloride 93 (*)    Glucose, Bld 104 (*)    Creatinine, Ser 1.04 (*)    Calcium 6.5 (*)    Total Protein 5.7 (*)    Albumin 2.0 (*)    AST 59 (*)    Alkaline Phosphatase 288 (*)    Total Bilirubin 2.2 (*)    All other components within normal limits  BRAIN NATRIURETIC PEPTIDE - Abnormal; Notable for the following components:   B Natriuretic Peptide 352.7 (*)    All other components within normal limits  CBC WITH DIFFERENTIAL/PLATELET - Abnormal; Notable for the following components:   RBC 3.27 (*)    Hemoglobin 9.7 (*)    HCT 29.3 (*)    RDW 27.3 (*)    nRBC 0.3 (*)    All other components  within normal limits  TROPONIN I (HIGH SENSITIVITY) - Abnormal; Notable for the following components:   Troponin I (High Sensitivity) 28 (*)    All other components within normal limits  MAGNESIUM  TROPONIN I (HIGH SENSITIVITY)   ____________________________________________  EKG  Thanks EKG read interpreted by me shows A. fib with RVR at a rate of 118 normal axis no acute ST-T wave changes ____________________________________________  RADIOLOGY 07/22/21, personally viewed and evaluated these images (plain radiographs) as part of my medical  decision making, as well as reviewing the written report by the radiologist.  ED MD interpretation: Chest x-ray read by radiology reviewed by me does not appear to show any acute pathology.  Official radiology report(s): DG Chest Portable 1 View  Result Date: 07/20/2021 CLINICAL DATA:  Chest pain, fall. EXAM: PORTABLE CHEST 1 VIEW COMPARISON:  Exam from June 09, 2021. FINDINGS: Findings EKG leads project over the lower chest. Trachea is midline. Cardiomediastinal contours and hilar structures are normal. Lungs are clear. No visible pneumothorax. No sign of pleural effusion on frontal radiograph. On limited assessment no acute skeletal process. IMPRESSION: No acute cardiopulmonary disease. Electronically Signed   By: Donzetta Kohut M.D.   On: 07/20/2021 13:19    ____________________________________________   PROCEDURES  Procedure(s) performed (including Critical Care):  Procedures   ____________________________________________   INITIAL IMPRESSION / ASSESSMENT AND PLAN / ED COURSE ----------------------------------------- 3:25 PM on 07/20/2021 ----------------------------------------- Patient with somewhat elevated troponin recent A. fib with RVR now just in A. fib with a normal rate.  Patient was remains somewhat hypotensive.  I would prefer to admit the patient patient herself wants to go home.  We will wait and see what the  second troponin and the magnesium look like.  I am signing the patient out pending the last 2 lab tests.               ____________________________________________   FINAL CLINICAL IMPRESSION(S) / ED DIAGNOSES  Final diagnoses:  Atrial fibrillation with RVR (HCC)  Hypotension, unspecified hypotension type     ED Discharge Orders     None        Note:  This document was prepared using Dragon voice recognition software and may include unintentional dictation errors.    Arnaldo Natal, MD 07/20/21 [id number]

## 2021-07-21 ENCOUNTER — Encounter: Payer: Self-pay | Admitting: Family Medicine

## 2021-07-21 DIAGNOSIS — I491 Atrial premature depolarization: Secondary | ICD-10-CM | POA: Insufficient documentation

## 2021-07-21 DIAGNOSIS — D649 Anemia, unspecified: Secondary | ICD-10-CM | POA: Insufficient documentation

## 2021-07-21 DIAGNOSIS — R296 Repeated falls: Secondary | ICD-10-CM

## 2021-07-21 LAB — CBC
HCT: 19.5 % — ABNORMAL LOW (ref 36.0–46.0)
Hemoglobin: 6.6 g/dL — ABNORMAL LOW (ref 12.0–15.0)
MCH: 30.1 pg (ref 26.0–34.0)
MCHC: 33.8 g/dL (ref 30.0–36.0)
MCV: 89 fL (ref 80.0–100.0)
Platelets: 221 10*3/uL (ref 150–400)
RBC: 2.19 MIL/uL — ABNORMAL LOW (ref 3.87–5.11)
RDW: 27.8 % — ABNORMAL HIGH (ref 11.5–15.5)
WBC: 6.8 10*3/uL (ref 4.0–10.5)
nRBC: 0 % (ref 0.0–0.2)

## 2021-07-21 LAB — BASIC METABOLIC PANEL
Anion gap: 12 (ref 5–15)
BUN: 13 mg/dL (ref 8–23)
CO2: 24 mmol/L (ref 22–32)
Calcium: 6.2 mg/dL — CL (ref 8.9–10.3)
Chloride: 97 mmol/L — ABNORMAL LOW (ref 98–111)
Creatinine, Ser: 0.87 mg/dL (ref 0.44–1.00)
GFR, Estimated: >60 mL/min (ref >60)
Glucose, Bld: 81 mg/dL (ref 70–99)
Potassium: 3.5 mmol/L (ref 3.5–5.1)
Sodium: 133 mmol/L — ABNORMAL LOW (ref 135–145)

## 2021-07-21 LAB — PROTIME-INR
INR: 1.1 (ref 0.8–1.2)
Prothrombin Time: 13.9 seconds (ref 11.4–15.2)

## 2021-07-21 LAB — HEMOGLOBIN: Hemoglobin: 7 g/dL — ABNORMAL LOW (ref 12.0–15.0)

## 2021-07-21 LAB — MAGNESIUM: Magnesium: 1.8 mg/dL (ref 1.7–2.4)

## 2021-07-21 NOTE — ED Notes (Signed)
Pt started yelling out and wants to leave. Provider previously explained it woulb be AMA, pt understands the risks of leaving AMA. She signed ama form , ivs removed, and pt taken to lobby in wheel chair, pt called a ride

## 2021-07-21 NOTE — Consult Note (Addendum)
Cardiology Consultation:   Patient ID: Jamie Wilkins; [id number]; 1961/08/01   Admit date: 07/20/2021 Date of Consult: 07/21/2021  Primary Care Provider: [facility] Primary Cardiologist: Kirke Corin (not seen in clinic) Primary Electrophysiologist:  None   Patient Profile:   Jamie Wilkins is a 60 y.o. female with a hx of PAF diagnosed in 01/2021 not on [facility] given fall risk with ongoing alcohol abuse and isolated episode, GI bleed with severe anemia, HTN, asthma, and EtOH/tobacco abuse who is being seen today for the evaluation of Afib with RVR at the request of Dr. Arville Care.  History of Present Illness:   Jamie Wilkins was admitted in 01/2021 after sustaining a syncopal episode felt to be related to possible orthostasis in the setting of excessive alcohol use, dehydration, and severe metabolic derangements. She was incidentally noted to be in Afib and converted to sinus rhythm. Echo showed an EF of 50-55%, no RWMA, mild LVH, normal LV diastolic function parameters, normal PASP, mild to moderate mitral regurgitation, mild aortic valve sclerosis without stenosis, and an estimated right atrial pressure of 3 mmHg. She had atypical right-sided chest pain felt to be musculoskeletal in etiology and minimal HS-Tn elevation. She has not followed up in our office.   She was admitted in 05/2021 with an upper GI bleed and symptomatic anemia with a HGB of 4.7, requiring transfusion of 3 units of pRBC and iron infusion. EGD was unrevealing. Colonoscopy showed severe inflammation of the ascending colon consistent with colitis.   She was admitted 8/30 after sustaining a fall with associated weakness. She was found to have multiple severe metabolic derangements including potassium 2.6, calcium 6.5, chloride 93, along with mild AKI with a SCr of 1.04, albumin 2.0, alkaline phosphatase 288, AST 59, T bili 2.2, and magnesium 0.9. She was also noted to have an elevated D-dimer at 2.9, HS-Tn 28 with  a delta troponin of 20, BNP 352, and HGB 9.7. She was reported to be in Afib with RVR with ventricular rates in the 1-teens. EKG showed sinus tachycardia with PACs. CTA chest was negative for PE with noted small effusions and basilar atelectasis, marked hepatic steatosis that was potentially worse, as well as scattered pulmonary nodules and aortic atherosclerosis. In the ED, she received ASA 81 mg x 4, calcium gluconate 1 g, 4 g of IV magnesium sulfate, 20 mEq of IV KCl, 40 mEq of oral KCl, and 20 mmol of potassium phosphate. Labs this morning show a potassium of 3.5 and calcium 6.2. CBC is pending. She reports she has been sober for 18 days now. She eats "what they bring me." She is without chest pain, dyspnea, palpitations, dizziness, presyncope, or syncope. She reports she did not have a syncopal episode, suffer LOC, or hit her head. She denies any further GI bleeding.    Past Medical History:  Diagnosis Date   Asthma    Heavy menstrual period    Hypertension     Past Surgical History:  Procedure Laterality Date   ABDOMINAL HYSTERECTOMY     partial   COLONOSCOPY N/A 06/12/2021   Procedure: COLONOSCOPY;  Surgeon: Toney Reil, MD;  Location: [facility] ENDOSCOPY;  Service: Gastroenterology;  Laterality: N/A;   ESOPHAGOGASTRODUODENOSCOPY N/A 06/10/2021   Procedure: ESOPHAGOGASTRODUODENOSCOPY (EGD);  Surgeon: Toledo, Boykin Nearing, MD;  Location: [facility] ENDOSCOPY;  Service: Gastroenterology;  Laterality: N/A;   FOOT SURGERY     GIVENS CAPSULE STUDY N/A 06/12/2021   Procedure: GIVENS CAPSULE STUDY;  Surgeon: Lannette Donath  Betti Cruz, MD;  Location: [facility] ENDOSCOPY;  Service: Gastroenterology;  Laterality: N/A;   S/P BTL       Home Meds: Prior to Admission medications   Medication Sig Start Date End Date Taking? Authorizing Provider  ferrous sulfate (FERROUSUL) 325 (65 FE) MG tablet Take 1 tablet (325 mg total) by mouth every other day. Patient not taking: Reported on 07/20/2021 06/12/21   Kathrynn Running, MD    Inpatient Medications: Scheduled Meds:  calcium carbonate  1,250 mg Oral TID WC   enoxaparin (LOVENOX) injection  40 mg Subcutaneous Q24H   Continuous Infusions:  sodium chloride 100 mL/hr at 07/20/21 2250   0.9 % NaCl with KCl 40 mEq / L 100 mL/hr at 07/21/21 0458   PRN Meds: acetaminophen **OR** acetaminophen, magnesium hydroxide, ondansetron **OR** ondansetron (ZOFRAN) IV, traZODone  Allergies:  No Known Allergies  Social History:   Social History   Socioeconomic History   Marital status: Divorced    Spouse name: Not on file   Number of children: Not on file   Years of education: Not on file   Highest education level: Not on file  Occupational History   Not on file  Tobacco Use   Smoking status: Every Day    Packs/day: 0.50    Types: Cigarettes   Smokeless tobacco: Never  Vaping Use   Vaping Use: Never used  Substance and Sexual Activity   Alcohol use: Yes    Alcohol/week: 7.0 standard drinks    Types: 7 Standard drinks or equivalent per week   Drug use: Never   Sexual activity: Not on file  Other Topics Concern   Not on file  Social History Narrative   Not on file   Social Determinants of Health   Financial Resource Strain: Not on file  Food Insecurity: Not on file  Transportation Needs: Not on file  Physical Activity: Not on file  Stress: Not on file  Social Connections: Not on file  Intimate Partner Violence: Not on file     Family History:   Family History  Problem Relation Age of Onset   CAD Father    CAD Brother    CAD Brother     ROS:  Review of Systems  Constitutional:  Positive for malaise/fatigue. Negative for chills, diaphoresis, fever and weight loss.  HENT:  Negative for congestion.   Eyes:  Negative for discharge and redness.  Respiratory:  Negative for cough, sputum production, shortness of breath and wheezing.   Cardiovascular:  Negative for chest pain, palpitations, orthopnea, claudication, leg swelling and PND.   Gastrointestinal:  Negative for abdominal pain, blood in stool, heartburn, melena, nausea and vomiting.  Musculoskeletal:  Positive for falls. Negative for myalgias.  Skin:  Negative for rash.  Neurological:  Positive for weakness. Negative for dizziness, tingling, tremors, sensory change, speech change, focal weakness and loss of consciousness.  Endo/Heme/Allergies:  Does not bruise/bleed easily.  Psychiatric/Behavioral:  Negative for substance abuse. The patient is not nervous/anxious.   All other systems reviewed and are negative.    Physical Exam/Data:   Vitals:   07/21/21 0330 07/21/21 0530 07/21/21 0600 07/21/21 0630  BP: 97/63 96/63 92/79  100/63  Pulse: 75   70  Resp: 17 18 20 19   Temp:      TempSrc:      SpO2: 97%  98% 100%  Weight:      Height:        Intake/Output Summary (Last 24 hours) at 07/21/2021 0756 Last data  filed at 07/21/2021 0442 Gross per 24 hour  Intake 600 ml  Output --  Net 600 ml   Filed Weights   07/20/21 1216  Weight: 71.7 kg   Body mass index is 24.75 kg/m.   Physical Exam: General: Well developed, well nourished, in no acute distress. Head: Normocephalic, atraumatic, sclera non-icteric, no xanthomas, nares without discharge.  Neck: Negative for carotid bruits. JVD not elevated. Lungs: Clear bilaterally to auscultation without wheezes, rales, or rhonchi. Breathing is unlabored. Heart: RRR with extra systoles with S1 S2. No murmurs, rubs, or gallops appreciated. Abdomen: Soft, non-tender, non-distended with normoactive bowel sounds. No hepatomegaly. No rebound/guarding. No obvious abdominal masses. Msk:  Strength and tone appear normal for age. Extremities: No clubbing or cyanosis. No edema. Distal pedal pulses are 2+ and equal bilaterally. Neuro: Alert and oriented X 3. No facial asymmetry. No focal deficit. Moves all extremities spontaneously. Psych:  Responds to questions appropriately with a normal affect.   EKG:  The EKG was personally  reviewed and demonstrates: Sinus tachycardia, 118 bpm, PACs, rare ventricular couplets, nonspecific st/t changes Telemetry:  Telemetry was personally reviewed and demonstrates: sinus rhythm with PACs  Weights: Filed Weights   07/20/21 1216  Weight: 71.7 kg    Relevant CV Studies:  2D echo 02/16/2021: 1. Left ventricular ejection fraction, by estimation, is 50 to 55%. The  left ventricle has low normal function. The left ventricle has no regional  wall motion abnormalities. There is mild left ventricular hypertrophy.  Left ventricular diastolic  parameters were normal.   2. Right ventricular systolic function is normal. The right ventricular  size is normal. There is normal pulmonary artery systolic pressure.   3. The mitral valve is normal in structure. Mild to moderate mitral valve  regurgitation. No evidence of mitral stenosis.   4. The aortic valve is normal in structure. Aortic valve regurgitation is  not visualized. Mild aortic valve sclerosis is present, with no evidence  of aortic valve stenosis.   5. The inferior vena cava is normal in size with greater than 50%  respiratory variability, suggesting right atrial pressure of 3 mmHg.  Laboratory Data:  Chemistry Recent Labs  Lab 07/20/21 1242 07/21/21 0456  NA 135 133*  K 2.6* 3.5  CL 93* 97*  CO2 28 24  GLUCOSE 104* 81  BUN 13 13  CREATININE 1.04* 0.87  CALCIUM 6.5* 6.2*  GFRNONAA >60 >60  ANIONGAP 14 12    Recent Labs  Lab 07/20/21 1242  PROT 5.7*  ALBUMIN 2.0*  AST 59*  ALT 17  ALKPHOS 288*  BILITOT 2.2*   Hematology Recent Labs  Lab 07/20/21 1242  WBC 7.2  RBC 3.27*  HGB 9.7*  HCT 29.3*  MCV 89.6  MCH 29.7  MCHC 33.1  RDW 27.3*  PLT 220   Cardiac EnzymesNo results for input(s): TROPONINI in the last 168 hours. No results for input(s): TROPIPOC in the last 168 hours.  BNP Recent Labs  Lab 07/20/21 1242  BNP 352.7*    DDimer  Recent Labs  Lab 07/20/21 1722  DDIMER 2.90*     Radiology/Studies:  CT Angio Chest PE W and/or Wo Contrast  Result Date: 07/20/2021 IMPRESSION: Negative for pulmonary embolism. Mildly limited assessment at the subsegmental level. Small effusions and basilar atelectasis. Marked hepatic steatosis, potentially worsening over a series of prior studies. Correlate with any signs of liver disease. Aortic atherosclerosis. Scattered small pulmonary nodules less than 5 mm, stable since the prior study. No follow-up needed  if patient is low-risk (and has no known or suspected primary neoplasm). Non-contrast chest CT can be considered in 12 months, from the March of 2022 evaluation, if patient is high-risk. This recommendation follows the consensus statement: Guidelines for Management of Incidental Pulmonary Nodules Detected on CT Images: From the Fleischner Society 2017; Radiology 2017; 284:228-243. Electronically Signed   By: Donzetta Kohut M.D.   On: 07/20/2021 19:28   DG Chest Portable 1 View  Result Date: 07/20/2021 IMPRESSION: No acute cardiopulmonary disease. Electronically Signed   By: Donzetta Kohut M.D.   On: 07/20/2021 13:19    Assessment and Plan:   1. PAF: -Has been in sinus rhythm in the ED, no evidence of Afib noted -Not an OAC candidate with ongoing alcohol abuse, fall risk, and prior severe transfusion dependent anemia in the setting of GI bleed -CHADS2VASc 2 (HTN, sex category) -Prior TSH normal -Recommend ongoing correction of lytes  2. Elevated HS-Tn: -Minimally elevated and flat/down trending -Not consistent with ACS -No indication for heparin gtt -Recent echo with preserved LVSF and normal wall motion as above -No plans for inpatient ischemic evaluation  3. Pleural effusions: -Likely multifactorial including Afib with RVR and third spacing for significant hypoalbuminemia  -No indication for thoracentesis -Recommend correction of metabolic derangements  -Asymptomatic   4. Metabolic derangements: -Management per  IM  5. Falls: -Likely multifactorial including malnutrition with subsequent severe metabolic derangements, orthostasis, and general weakness -No evidence of syncope  -Monitor on telemetry to evaluate for significant bradycardia, high-grade AV block, or post termination pause -Recommend PT/OT evaluation   6. Alcohol and tobacco abuse: -Complete cessation recommended   7. History of GI bleed with ongoing anemia: -HGB down to 6.6 in the ED -Transfuse per IM   For questions or updates, please contact [facility] Please consult [email or web address] for contact info under Cardiology/STEMI.   Signed, Eula Listen, PA-C [facility] Pager: [phone] 07/21/2021, 7:56 AM

## 2021-07-21 NOTE — Progress Notes (Signed)
PROGRESS NOTE    Jamie Wilkins  DJM:[id number] DOB: 1961-03-29 DOA: 07/20/2021 PCP: [facility]    Chief Complaint  Patient presents with   Fall   Chest Pain    Brief Narrative:   Jamie Wilkins is a 60 y.o. female with medical history significant for asthma, hypertension, atrial fibrillation not on anticoagulation likely from fall risk, alcohol hepatitis and ongoing alcohol abuse, who presented to the emergency room with acute onset of fall.  The patient stated that her legs give way.  She has been feeling generally weak.  She denies any head injury or presyncope or syncope.  No paresthesias or focal muscle weakness.  She admits to chest pain that felt like gas pain per her report.  Found to have hypokalemia, hypomagnesium , hypocalcemia ,acute  anemia  Subjective:  Heart rate improved, hgb dropped ,bp low normal, she denies over sign of bleeding   Assessment & Plan:   Active Problems:   Atrial fibrillation with RVR (HCC)  Mechanical fall likely from tachycardia, dehydration ,severe electrolyte abnormality Fall precaution Monitor orthostatic vital sign, continue on hydration, encourage oral intake PT eval ordered  Electrolyte abnormality, hypokalemia, hypomagnesemia, hypocalcemia Replace  Tachycardia with history of paroxysmal A. Fib, not a candidate for anticoagulation due to history of GI bleed -Cardiology consulted, will follow recommendation  Acute anemia Hemoglobin 9.7 on presentation Hemoglobin 7 on repeat Monitor hemoglobin every 12 hours Check FOBT Transfuse if hemoglobin less than 7  Abnormal LFT Recent alcohol use Also has hepatitis steatosis on CT imaging   Scattered small pulmonary nodules less than 5 mm, stable since the prior study. No follow-up needed if patient is low-risk (and has no known or suspected primary neoplasm). Non-contrast chest CT can be considered in 12 months, from the March of 2022 evaluation,  if patient is high-risk. This recommendation follows the consensus statement: Guidelines for Management of Incidental Pulmonary Nodules Detected on CT Images: From the Fleischner Society 2017; Radiology 2017; 284:228-243.    Body mass index is 24.75 kg/m.Marland Kitchen   Skin Assessment:  I have examined the patient's skin and I agree with the wound assessment as performed by the wound care RN as outlined below:  Pressure Injury 07/21/21 Coccyx Mid Stage 2 -  Partial thickness loss of dermis presenting as a shallow open injury with a red, pink wound bed without slough. (Active)  07/21/21 0702  Location: Coccyx  Location Orientation: Mid  Staging: Stage 2 -  Partial thickness loss of dermis presenting as a shallow open injury with a red, pink wound bed without slough.  Wound Description (Comments):   Present on Admission: Yes    Unresulted Labs (From admission, onward)     Start     Ordered   07/22/21 0500  Comprehensive metabolic panel  Tomorrow morning,   STAT        07/21/21 1533   07/21/21 0927  Magnesium  5A & 5P,   STAT      07/21/21 0926   07/21/21 0927  Hemoglobin  5A & 5P,   STAT      07/21/21 0926   Unscheduled  Occult blood card to lab, stool RN will collect  As needed,   STAT     Question:  Specimen to be collected by:  Answer:  RN will collect   07/21/21 1317              DVT prophylaxis: Place and maintain sequential compression device Start: 07/21/21 1220  Code Status:full Family Communication: patient, she declined my offer to call her family, reports she lives alone Disposition:   Status is: Inpatient   Dispo: The patient is from: home, lives along              Anticipated d/c is to: home, PT eval              Anticipated d/c date is:               Patient currently   Consultants:  cardiology  Procedures:  none  Antimicrobials:   none     Objective: Vitals:   07/21/21 1100 07/21/21 1130 07/21/21 1200 07/21/21 1230  BP: 102/78 100/77  100/74 98/75  Pulse:    (!) 34  Resp: 20 (!) 21 19 18   Temp:      TempSrc:      SpO2:    100%  Weight:      Height:        Intake/Output Summary (Last 24 hours) at 07/21/2021 1602 Last data filed at 07/21/2021 1526 Gross per 24 hour  Intake 2600 ml  Output --  Net 2600 ml   Filed Weights   07/20/21 1216  Weight: 71.7 kg    Examination:  General exam: calm, NAD Respiratory system: Clear to auscultation. Respiratory effort normal. Cardiovascular system: S1 & S2 heard, RRR. No JVD, no murmur, No pedal edema. Gastrointestinal system: Abdomen is nondistended, soft and nontender.  Normal bowel sounds heard. Central nervous system: Alert and oriented. No focal neurological deficits. Extremities: Symmetric 5 x 5 power. Skin: pressure ulcer at sacral region Psychiatry: Judgement and insight appear normal. Mood & affect appropriate.     Data Reviewed: I have personally reviewed following labs and imaging studies  CBC: Recent Labs  Lab 07/20/21 1242 07/21/21 0731 07/21/21 1144  WBC 7.2 6.8  --   NEUTROABS 5.2  --   --   HGB 9.7* 6.6* 7.0*  HCT 29.3* 19.5*  --   MCV 89.6 89.0  --   PLT 220 221  --     Basic Metabolic Panel: Recent Labs  Lab 07/20/21 1237 07/20/21 1242 07/21/21 0456 07/21/21 1144  NA  --  135 133*  --   K  --  2.6* 3.5  --   CL  --  93* 97*  --   CO2  --  28 24  --   GLUCOSE  --  104* 81  --   BUN  --  13 13  --   CREATININE  --  1.04* 0.87  --   CALCIUM  --  6.5* 6.2*  --   MG 0.9*  --   --  1.8  PHOS 1.5*  --   --   --     GFR: Estimated Creatinine Clearance: 66 mL/min (by C-G formula based on SCr of 0.87 mg/dL).  Liver Function Tests: Recent Labs  Lab 07/20/21 1242  AST 59*  ALT 17  ALKPHOS 288*  BILITOT 2.2*  PROT 5.7*  ALBUMIN 2.0*    CBG: No results for input(s): GLUCAP in the last 168 hours.   Recent Results (from the past 240 hour(s))  Resp Panel by RT-PCR (Flu A&B, Covid) Nasopharyngeal Swab     Status: None    Collection Time: 07/20/21  8:06 PM   Specimen: Nasopharyngeal Swab; Nasopharyngeal(NP) swabs in vial transport medium  Result Value Ref Range Status   SARS Coronavirus 2 by RT PCR NEGATIVE NEGATIVE Final  Comment: (NOTE) SARS-CoV-2 target nucleic acids are NOT DETECTED.  The SARS-CoV-2 RNA is generally detectable in upper respiratory specimens during the acute phase of infection. The lowest concentration of SARS-CoV-2 viral copies this assay can detect is 138 copies/mL. A negative result does not preclude SARS-Cov-2 infection and should not be used as the sole basis for treatment or other patient management decisions. A negative result may occur with  improper specimen collection/handling, submission of specimen other than nasopharyngeal swab, presence of viral mutation(s) within the areas targeted by this assay, and inadequate number of viral copies(<138 copies/mL). A negative result must be combined with clinical observations, patient history, and epidemiological information. The expected result is Negative.  Fact Sheet for Patients:  BloggerCourse.com  Fact Sheet for Healthcare Providers:  SeriousBroker.it  This test is no t yet approved or cleared by the [place] FDA and  has been authorized for detection and/or diagnosis of SARS-CoV-2 by FDA under an Emergency Use Authorization (EUA). This EUA will remain  in effect (meaning this test can be used) for the duration of the COVID-19 declaration under Section 564(b)(1) of the Act, 21 U.S.C.section 360bbb-3(b)(1), unless the authorization is terminated  or revoked sooner.       Influenza A by PCR NEGATIVE NEGATIVE Final   Influenza B by PCR NEGATIVE NEGATIVE Final    Comment: (NOTE) The Xpert Xpress SARS-CoV-2/FLU/RSV plus assay is intended as an aid in the diagnosis of influenza from Nasopharyngeal swab specimens and should not be used as a sole basis for treatment.  Nasal washings and aspirates are unacceptable for Xpert Xpress SARS-CoV-2/FLU/RSV testing.  Fact Sheet for Patients: BloggerCourse.com  Fact Sheet for Healthcare Providers: SeriousBroker.it  This test is not yet approved or cleared by the [place] FDA and has been authorized for detection and/or diagnosis of SARS-CoV-2 by FDA under an Emergency Use Authorization (EUA). This EUA will remain in effect (meaning this test can be used) for the duration of the COVID-19 declaration under Section 564(b)(1) of the Act, 21 U.S.C. section 360bbb-3(b)(1), unless the authorization is terminated or revoked.  Performed at [facility], [address] [place], [place] [address]          Radiology Studies: CT Angio Chest PE W and/or Wo Contrast  Result Date: 07/20/2021 CLINICAL DATA:  Suspect pulmonary embolism in a 60 year old female. EXAM: CT ANGIOGRAPHY CHEST WITH CONTRAST TECHNIQUE: Multidetector CT imaging of the chest was performed using the standard protocol during bolus administration of intravenous contrast. Multiplanar CT image reconstructions and MIPs were obtained to evaluate the vascular anatomy. CONTRAST:  47mL OMNIPAQUE IOHEXOL 350 MG/ML SOLN COMPARISON:  February 15, 2021. FINDINGS: Cardiovascular: The aorta shows normal caliber with scattered calcified atheromatous plaque. Heart size is normal with trace pericardial effusion. Main pulmonary artery density is 331 Hounsfield units. No sign of pulmonary embolism. Subsegmental level mildly limited by respiratory motion at the lung bases and in the mid chest. Mediastinum/Nodes: Esophagus grossly normal. Mildly patulous in the midportion. Thoracic inlet structures are unremarkable. No axillary lymphadenopathy. The no thoracic inlet lymphadenopathy. No mediastinal lymphadenopathy. No hilar lymphadenopathy. Lungs/Pleura: Small bilateral pleural effusions layering dependently. No sign  of dense consolidation, mild basilar atelectasis. Small RIGHT lower lobe pulmonary nodule (image 61/6) 4 mm is unchanged since March. Tiny RIGHT lower lobe pulmonary nodule along the medial aspect of the RIGHT lower lobe 3 mm (image 60/6) also unchanged. No sign of pneumothorax.  Airways are patent. Small 3-4 mm nodules in the RIGHT middle lobe also show  a similar appearance. Upper Abdomen: Marked hepatic steatosis likely worse than on the previous exam though difficult to quantify across these 2 evaluations. Liver is incompletely imaged. Imaged portions of adrenal glands, spleen, pancreas and RIGHT and LEFT kidney unremarkable. Imaged portions of the gastrointestinal tract likewise with very limited assessment without acute process. Musculoskeletal: No acute musculoskeletal finding. Spinal degenerative changes. Review of the MIP images confirms the above findings. IMPRESSION: Negative for pulmonary embolism. Mildly limited assessment at the subsegmental level. Small effusions and basilar atelectasis. Marked hepatic steatosis, potentially worsening over a series of prior studies. Correlate with any signs of liver disease. Aortic atherosclerosis. Scattered small pulmonary nodules less than 5 mm, stable since the prior study. No follow-up needed if patient is low-risk (and has no known or suspected primary neoplasm). Non-contrast chest CT can be considered in 12 months, from the March of 2022 evaluation, if patient is high-risk. This recommendation follows the consensus statement: Guidelines for Management of Incidental Pulmonary Nodules Detected on CT Images: From the Fleischner Society 2017; Radiology 2017; 284:228-243. Electronically Signed   By: Donzetta KohutGeoffrey  Wile M.D.   On: 07/20/2021 19:28   DG Chest Portable 1 View  Result Date: 07/20/2021 CLINICAL DATA:  Chest pain, fall. EXAM: PORTABLE CHEST 1 VIEW COMPARISON:  Exam from June 09, 2021. FINDINGS: Findings EKG leads project over the lower chest. Trachea is  midline. Cardiomediastinal contours and hilar structures are normal. Lungs are clear. No visible pneumothorax. No sign of pleural effusion on frontal radiograph. On limited assessment no acute skeletal process. IMPRESSION: No acute cardiopulmonary disease. Electronically Signed   By: Donzetta KohutGeoffrey  Wile M.D.   On: 07/20/2021 13:19        Scheduled Meds:  calcium carbonate  1,250 mg Oral TID WC   Continuous Infusions:  0.9 % NaCl with KCl 40 mEq / L 100 mL/hr at 07/21/21 1525     LOS: 1 day   Time spent: 35mins Greater than 50% of this time was spent in counseling, explanation of diagnosis, planning of further management, and coordination of care.   Voice Recognition Reubin [place]/Dragon dictation system was used to create this note, attempts have been made to correct errors. Please contact the author with questions and/or clarifications.   Albertine GratesFang Sebastiana Wuest, MD PhD FACP [facility]  Available via Epic secure chat 7am-7pm for nonurgent issues Please page for urgent issues To page the attending provider between 7A-7P or the covering provider during after hours 7P-7A, please log into the web site [email or web address] and access using universal [place] password for that web site. If you do not have the password, please call the hospital operator.    07/21/2021, 4:02 PM

## 2021-07-21 NOTE — Discharge Summary (Signed)
Discharge Summary  Jamie Wilkins JJH:[id number] DOB: October 15, 1961  PCP: [facility] date: 07/20/2021 Discharge date: 07/21/2021  Patient signed out hospital against medical advises   Filed Weights   07/20/21 1216  Weight: 71.7 kg    History of present illness: (per admitting MD Dr Sidney Ace) Jamie Wilkins is a 60 y.o. female with medical history significant for asthma, hypertension, atrial fibrillation not on anticoagulation likely from fall risk, alcohol hepatitis and ongoing alcohol abuse, who presented to the emergency room with acute onset of fall.  The patient stated that her legs give way.  She has been feeling generally weak.  She denies any head injury or presyncope or syncope.  No paresthesias or focal muscle weakness.  She admits to chest pain that felt like gas pain per her report.  No nausea or vomiting or diaphoresis.  No leg pain or edema or recent travels or surgeries.  No cough or wheezing or hemoptysis.  No bleeding diathesis.  Her last alcoholic drink was 2 weeks ago but she is not sure as she quit for good.  No orthopnea or dyspnea on exertion or paroxysmal nocturnal dyspnea.   ED Course: When she came to the ER heart rate was 118 and otherwise vital signs were within normal.  Labs were remarkable for hypokalemia of 2.6 and hypocalcemia of 6.5 with albumin of 2 though, and top of T9 5.7.  Total bili was 2.2 and alk phos 288 with AST 59 and normal ALT of 17.  BNP was 352.7 and high-sensitivity troponin I was 20.  CBC showed anemia with hemoglobin of 9.7 hematocrit 29.3 compared to 8.6 and 25.4 on 06/12/2021.  High-sensitivity troponin I was 28 and later 20.  Quantitative D-dimer was 2.9.   EKG as reviewed by me : Showed atrial fibrillation with rapid ventricular response of 118 with PVCs. Imaging: Portable chest x-ray showed no acute cardiopulmonary disease.  Chest CTA showed no evidence for PE.  Showed small effusions and basilar atelectasis, marked  hepatic steatosis, aortic atherosclerosis and scattered small pulmonary nodules less than 5 mm which are stable from prior study.  The patient was given 4 baby aspirin, 1 g of IV calcium gluconate, 4 g of IV magnesium sulfate, 20 mEq of IV potassium chloride, and 40 mEq p.o. potassium chloride as well as 20 MM0L of potassium phosphate.  The patient will be admitted to a progressive unit bed for further evaluation and management  Hospital Course:  Active Problems:   Atrial fibrillation with RVR (HCC)    Mechanical fall likely from tachycardia, dehydration ,severe electrolyte abnormality Fall precaution Monitor orthostatic vital sign, continue on hydration, encourage oral intake PT eval ordered   Electrolyte abnormality, hypokalemia, hypomagnesemia, hypocalcemia Replace   Tachycardia with history of paroxysmal A. Fib, not a candidate for anticoagulation due to history of GI bleed -Cardiology consulted, will follow recommendation   Acute anemia Hemoglobin 9.7 on presentation Hemoglobin 7 on repeat Monitor hemoglobin every 12 hours Check FOBT Transfuse if hemoglobin less than 7   Abnormal LFT Recent alcohol use Also has hepatitis steatosis on CT imaging     Scattered small pulmonary nodules less than 5 mm, stable since the prior study. No follow-up needed if patient is low-risk (and has no known or suspected primary neoplasm). Non-contrast chest CT can be considered in 12 months, from the March of 2022 evaluation, if patient is high-risk. This recommendation follows the consensus statement: Guidelines for Management of Incidental Pulmonary Nodules Detected on  CT Images: From the Fleischner Society 2017; Radiology 2017; [id number].      Body mass index is 24.75 kg/m.Marland Kitchen     Skin Assessment:   I have examined the patient's skin and I agree with the wound assessment as performed by the wound care RN as outlined below:   Pressure Injury 07/21/21 Coccyx Mid Stage 2 -  Partial  thickness loss of dermis presenting as a shallow open injury with a red, pink wound bed without slough. (Active)  07/21/21 0702  Location: Coccyx  Location Orientation: Mid  Staging: Stage 2 -  Partial thickness loss of dermis presenting as a shallow open injury with a red, pink wound bed without slough.  Wound Description (Comments):   Present on Admission: Yes     Consultants:  cardiology   Procedures:  none   Antimicrobials:   none   Discharge Instructions You were cared for by a hospitalist during your hospital stay. If you have any questions about your discharge medications or the care you received while you were in the hospital after you are discharged, you can call the unit and asked to speak with the hospitalist on call if the hospitalist that took care of you is not available. Once you are discharged, your primary care physician will handle any further medical issues. Please note that NO REFILLS for any discharge medications will be authorized once you are discharged, as it is imperative that you return to your primary care physician (or establish a relationship with a primary care physician if you do not have one) for your aftercare needs so that they can reassess your need for medications and monitor your lab values.  Discharge Instructions     Amb referral to [facility]   Complete by: As directed       Allergies as of 07/21/2021   No Known Allergies        No Known Allergies  Follow-up Information     Schedule an appointment as soon as possible for a visit  with [facility].   Specialty: General Practice Contact information: [address] [place] [address] [phone]                  The results of significant diagnostics from this hospitalization (including imaging, microbiology, ancillary and laboratory) are listed below for reference.    Significant Diagnostic Studies: CT Angio Chest PE W and/or Wo  Contrast  Result Date: 07/20/2021 CLINICAL DATA:  Suspect pulmonary embolism in a 60 year old female. EXAM: CT ANGIOGRAPHY CHEST WITH CONTRAST TECHNIQUE: Multidetector CT imaging of the chest was performed using the standard protocol during bolus administration of intravenous contrast. Multiplanar CT image reconstructions and MIPs were obtained to evaluate the vascular anatomy. CONTRAST:  52m OMNIPAQUE IOHEXOL 350 MG/ML SOLN COMPARISON:  February 15, 2021. FINDINGS: Cardiovascular: The aorta shows normal caliber with scattered calcified atheromatous plaque. Heart size is normal with trace pericardial effusion. Main pulmonary artery density is 331 Hounsfield units. No sign of pulmonary embolism. Subsegmental level mildly limited by respiratory motion at the lung bases and in the mid chest. Mediastinum/Nodes: Esophagus grossly normal. Mildly patulous in the midportion. Thoracic inlet structures are unremarkable. No axillary lymphadenopathy. The no thoracic inlet lymphadenopathy. No mediastinal lymphadenopathy. No hilar lymphadenopathy. Lungs/Pleura: Small bilateral pleural effusions layering dependently. No sign of dense consolidation, mild basilar atelectasis. Small RIGHT lower lobe pulmonary nodule (image 61/6) 4 mm is unchanged since March. Tiny RIGHT lower lobe pulmonary nodule along the medial aspect of  the RIGHT lower lobe 3 mm (image 60/6) also unchanged. No sign of pneumothorax.  Airways are patent. Small 3-4 mm nodules in the RIGHT middle lobe also show a similar appearance. Upper Abdomen: Marked hepatic steatosis likely worse than on the previous exam though difficult to quantify across these 2 evaluations. Liver is incompletely imaged. Imaged portions of adrenal glands, spleen, pancreas and RIGHT and LEFT kidney unremarkable. Imaged portions of the gastrointestinal tract likewise with very limited assessment without acute process. Musculoskeletal: No acute musculoskeletal finding. Spinal degenerative  changes. Review of the MIP images confirms the above findings. IMPRESSION: Negative for pulmonary embolism. Mildly limited assessment at the subsegmental level. Small effusions and basilar atelectasis. Marked hepatic steatosis, potentially worsening over a series of prior studies. Correlate with any signs of liver disease. Aortic atherosclerosis. Scattered small pulmonary nodules less than 5 mm, stable since the prior study. No follow-up needed if patient is low-risk (and has no known or suspected primary neoplasm). Non-contrast chest CT can be considered in 12 months, from the March of 2022 evaluation, if patient is high-risk. This recommendation follows the consensus statement: Guidelines for Management of Incidental Pulmonary Nodules Detected on CT Images: From the Fleischner Society 2017; Radiology 2017; 284:228-243. Electronically Signed   By: Zetta Bills M.D.   On: 07/20/2021 19:28   DG Chest Portable 1 View  Result Date: 07/20/2021 CLINICAL DATA:  Chest pain, fall. EXAM: PORTABLE CHEST 1 VIEW COMPARISON:  Exam from June 09, 2021. FINDINGS: Findings EKG leads project over the lower chest. Trachea is midline. Cardiomediastinal contours and hilar structures are normal. Lungs are clear. No visible pneumothorax. No sign of pleural effusion on frontal radiograph. On limited assessment no acute skeletal process. IMPRESSION: No acute cardiopulmonary disease. Electronically Signed   By: Zetta Bills M.D.   On: 07/20/2021 13:19    Microbiology: Recent Results (from the past 240 hour(s))  Resp Panel by RT-PCR (Flu A&B, Covid) Nasopharyngeal Swab     Status: None   Collection Time: 07/20/21  8:06 PM   Specimen: Nasopharyngeal Swab; Nasopharyngeal(NP) swabs in vial transport medium  Result Value Ref Range Status   SARS Coronavirus 2 by RT PCR NEGATIVE NEGATIVE Final    Comment: (NOTE) SARS-CoV-2 target nucleic acids are NOT DETECTED.  The SARS-CoV-2 RNA is generally detectable in upper  respiratory specimens during the acute phase of infection. The lowest concentration of SARS-CoV-2 viral copies this assay can detect is 138 copies/mL. A negative result does not preclude SARS-Cov-2 infection and should not be used as the sole basis for treatment or other patient management decisions. A negative result may occur with  improper specimen collection/handling, submission of specimen other than nasopharyngeal swab, presence of viral mutation(s) within the areas targeted by this assay, and inadequate number of viral copies(<138 copies/mL). A negative result must be combined with clinical observations, patient history, and epidemiological information. The expected result is Negative.  Fact Sheet for Patients:  EntrepreneurPulse.com.au  Fact Sheet for Healthcare Providers:  IncredibleEmployment.be  This test is no t yet approved or cleared by the [place] FDA and  has been authorized for detection and/or diagnosis of SARS-CoV-2 by FDA under an Emergency Use Authorization (EUA). This EUA will remain  in effect (meaning this test can be used) for the duration of the COVID-19 declaration under Section 564(b)(1) of the Act, 21 U.S.C.section 360bbb-3(b)(1), unless the authorization is terminated  or revoked sooner.       Influenza A by PCR NEGATIVE NEGATIVE Final   Influenza  B by PCR NEGATIVE NEGATIVE Final    Comment: (NOTE) The Xpert Xpress SARS-CoV-2/FLU/RSV plus assay is intended as an aid in the diagnosis of influenza from Nasopharyngeal swab specimens and should not be used as a sole basis for treatment. Nasal washings and aspirates are unacceptable for Xpert Xpress SARS-CoV-2/FLU/RSV testing.  Fact Sheet for Patients: EntrepreneurPulse.com.au  Fact Sheet for Healthcare Providers: IncredibleEmployment.be  This test is not yet approved or cleared by the [place] FDA and has been  authorized for detection and/or diagnosis of SARS-CoV-2 by FDA under an Emergency Use Authorization (EUA). This EUA will remain in effect (meaning this test can be used) for the duration of the COVID-19 declaration under Section 564(b)(1) of the Act, 21 U.S.C. section 360bbb-3(b)(1), unless the authorization is terminated or revoked.  Performed at [facility], [address] [place], [place] [address]      Labs: Basic Metabolic Panel: Recent Labs  Lab 07/20/21 1237 07/20/21 1242 07/21/21 0456 07/21/21 1144  NA  --  135 133*  --   K  --  2.6* 3.5  --   CL  --  93* 97*  --   CO2  --  28 24  --   GLUCOSE  --  104* 81  --   BUN  --  13 13  --   CREATININE  --  1.04* 0.87  --   CALCIUM  --  6.5* 6.2*  --   MG 0.9*  --   --  1.8  PHOS 1.5*  --   --   --    Liver Function Tests: Recent Labs  Lab 07/20/21 1242  AST 59*  ALT 17  ALKPHOS 288*  BILITOT 2.2*  PROT 5.7*  ALBUMIN 2.0*   No results for input(s): LIPASE, AMYLASE in the last 168 hours. No results for input(s): AMMONIA in the last 168 hours. CBC: Recent Labs  Lab 07/20/21 1242 07/21/21 0731 07/21/21 1144  WBC 7.2 6.8  --   NEUTROABS 5.2  --   --   HGB 9.7* 6.6* 7.0*  HCT 29.3* 19.5*  --   MCV 89.6 89.0  --   PLT 220 221  --    Cardiac Enzymes: No results for input(s): CKTOTAL, CKMB, CKMBINDEX, TROPONINI in the last 168 hours. BNP: BNP (last 3 results) Recent Labs    07/20/21 1242  BNP 352.7*    ProBNP (last 3 results) No results for input(s): PROBNP in the last 8760 hours.  CBG: No results for input(s): GLUCAP in the last 168 hours.     Signed:  Florencia Reasons MD, PhD, FACP  [facility] 07/21/2021, 9:43 PM

## 2021-07-21 NOTE — ED Notes (Deleted)
Crystal RN aware of assigned bed 

## 2021-07-21 NOTE — ED Notes (Signed)
New purple top sent to lab at this time per lab request for redraw.

## 2021-07-28 DIAGNOSIS — Z8719 Personal history of other diseases of the digestive system: Secondary | ICD-10-CM | POA: Insufficient documentation

## 2021-08-30 ENCOUNTER — Emergency Department: Payer: Medicaid Other

## 2021-08-30 ENCOUNTER — Other Ambulatory Visit: Payer: Self-pay

## 2021-08-30 ENCOUNTER — Encounter: Payer: Self-pay | Admitting: *Deleted

## 2021-08-30 ENCOUNTER — Inpatient Hospital Stay
Admission: EM | Admit: 2021-08-30 | Discharge: 2021-09-21 | DRG: 314 | Disposition: A | Discharge status: Home Health | Payer: Medicaid Other | Attending: Internal Medicine | Admitting: Internal Medicine

## 2021-08-30 DIAGNOSIS — Z8249 Family history of ischemic heart disease and other diseases of the circulatory system: Secondary | ICD-10-CM

## 2021-08-30 DIAGNOSIS — D638 Anemia in other chronic diseases classified elsewhere: Secondary | ICD-10-CM | POA: Diagnosis present

## 2021-08-30 DIAGNOSIS — I480 Paroxysmal atrial fibrillation: Secondary | ICD-10-CM | POA: Diagnosis present

## 2021-08-30 DIAGNOSIS — Z20822 Contact with and (suspected) exposure to covid-19: Secondary | ICD-10-CM | POA: Diagnosis present

## 2021-08-30 DIAGNOSIS — E538 Deficiency of other specified B group vitamins: Secondary | ICD-10-CM | POA: Diagnosis present

## 2021-08-30 DIAGNOSIS — D696 Thrombocytopenia, unspecified: Secondary | ICD-10-CM | POA: Diagnosis present

## 2021-08-30 DIAGNOSIS — E861 Hypovolemia: Secondary | ICD-10-CM

## 2021-08-30 DIAGNOSIS — F17210 Nicotine dependence, cigarettes, uncomplicated: Secondary | ICD-10-CM | POA: Diagnosis present

## 2021-08-30 DIAGNOSIS — L89316 Pressure-induced deep tissue damage of right buttock: Secondary | ICD-10-CM | POA: Diagnosis present

## 2021-08-30 DIAGNOSIS — F22 Delusional disorders: Secondary | ICD-10-CM | POA: Diagnosis present

## 2021-08-30 DIAGNOSIS — J45909 Unspecified asthma, uncomplicated: Secondary | ICD-10-CM | POA: Diagnosis present

## 2021-08-30 DIAGNOSIS — I10 Essential (primary) hypertension: Secondary | ICD-10-CM | POA: Diagnosis present

## 2021-08-30 DIAGNOSIS — I9589 Other hypotension: Principal | ICD-10-CM | POA: Diagnosis present

## 2021-08-30 DIAGNOSIS — F322 Major depressive disorder, single episode, severe without psychotic features: Secondary | ICD-10-CM | POA: Diagnosis present

## 2021-08-30 DIAGNOSIS — G9341 Metabolic encephalopathy: Secondary | ICD-10-CM | POA: Diagnosis present

## 2021-08-30 DIAGNOSIS — E860 Dehydration: Secondary | ICD-10-CM | POA: Diagnosis present

## 2021-08-30 DIAGNOSIS — I959 Hypotension, unspecified: Secondary | ICD-10-CM | POA: Diagnosis present

## 2021-08-30 DIAGNOSIS — K7030 Alcoholic cirrhosis of liver without ascites: Secondary | ICD-10-CM | POA: Diagnosis present

## 2021-08-30 DIAGNOSIS — Y901 Blood alcohol level of 20-39 mg/100 ml: Secondary | ICD-10-CM | POA: Diagnosis present

## 2021-08-30 DIAGNOSIS — F0631 Mood disorder due to known physiological condition with depressive features: Secondary | ICD-10-CM | POA: Diagnosis present

## 2021-08-30 DIAGNOSIS — R627 Adult failure to thrive: Secondary | ICD-10-CM

## 2021-08-30 DIAGNOSIS — F05 Delirium due to known physiological condition: Secondary | ICD-10-CM | POA: Diagnosis not present

## 2021-08-30 DIAGNOSIS — L8990 Pressure ulcer of unspecified site, unspecified stage: Secondary | ICD-10-CM | POA: Diagnosis present

## 2021-08-30 DIAGNOSIS — K7010 Alcoholic hepatitis without ascites: Secondary | ICD-10-CM | POA: Diagnosis present

## 2021-08-30 DIAGNOSIS — E876 Hypokalemia: Secondary | ICD-10-CM | POA: Diagnosis present

## 2021-08-30 DIAGNOSIS — L89312 Pressure ulcer of right buttock, stage 2: Secondary | ICD-10-CM

## 2021-08-30 DIAGNOSIS — K5900 Constipation, unspecified: Secondary | ICD-10-CM | POA: Diagnosis present

## 2021-08-30 DIAGNOSIS — R45851 Suicidal ideations: Secondary | ICD-10-CM | POA: Diagnosis present

## 2021-08-30 DIAGNOSIS — M79672 Pain in left foot: Secondary | ICD-10-CM | POA: Diagnosis present

## 2021-08-30 DIAGNOSIS — M79671 Pain in right foot: Secondary | ICD-10-CM | POA: Diagnosis present

## 2021-08-30 DIAGNOSIS — Z79899 Other long term (current) drug therapy: Secondary | ICD-10-CM

## 2021-08-30 DIAGNOSIS — F1010 Alcohol abuse, uncomplicated: Secondary | ICD-10-CM | POA: Diagnosis present

## 2021-08-30 LAB — HEPATIC FUNCTION PANEL
ALT: 48 U/L — ABNORMAL HIGH (ref 0–44)
AST: 145 U/L — ABNORMAL HIGH (ref 15–41)
Albumin: 2 g/dL — ABNORMAL LOW (ref 3.5–5.0)
Alkaline Phosphatase: 278 U/L — ABNORMAL HIGH (ref 38–126)
Bilirubin, Direct: 0.7 mg/dL — ABNORMAL HIGH (ref 0.0–0.2)
Indirect Bilirubin: 1 mg/dL — ABNORMAL HIGH (ref 0.3–0.9)
Total Bilirubin: 1.7 mg/dL — ABNORMAL HIGH (ref 0.3–1.2)
Total Protein: 5.7 g/dL — ABNORMAL LOW (ref 6.5–8.1)

## 2021-08-30 LAB — CBC
HCT: 25.3 % — ABNORMAL LOW (ref 36.0–46.0)
Hemoglobin: 8.6 g/dL — ABNORMAL LOW (ref 12.0–15.0)
MCH: 32.1 pg (ref 26.0–34.0)
MCHC: 34 g/dL (ref 30.0–36.0)
MCV: 94.4 fL (ref 80.0–100.0)
Platelets: 157 10*3/uL (ref 150–400)
RBC: 2.68 MIL/uL — ABNORMAL LOW (ref 3.87–5.11)
RDW: 17.3 % — ABNORMAL HIGH (ref 11.5–15.5)
WBC: 6.3 10*3/uL (ref 4.0–10.5)
nRBC: 0.8 % — ABNORMAL HIGH (ref 0.0–0.2)

## 2021-08-30 LAB — TSH: TSH: 3.569 u[IU]/mL (ref 0.350–4.500)

## 2021-08-30 LAB — TROPONIN I (HIGH SENSITIVITY)
Troponin I (High Sensitivity): 23 ng/L — ABNORMAL HIGH (ref <18)
Troponin I (High Sensitivity): 23 ng/L — ABNORMAL HIGH (ref <18)

## 2021-08-30 LAB — PHOSPHORUS: Phosphorus: 2.2 mg/dL — ABNORMAL LOW (ref 2.5–4.6)

## 2021-08-30 LAB — BASIC METABOLIC PANEL
Anion gap: 14 (ref 5–15)
BUN: 11 mg/dL (ref 8–23)
CO2: 27 mmol/L (ref 22–32)
Calcium: 7.7 mg/dL — ABNORMAL LOW (ref 8.9–10.3)
Chloride: 94 mmol/L — ABNORMAL LOW (ref 98–111)
Creatinine, Ser: 0.77 mg/dL (ref 0.44–1.00)
GFR, Estimated: >60 mL/min (ref >60)
Glucose, Bld: 85 mg/dL (ref 70–99)
Potassium: 2.5 mmol/L — CL (ref 3.5–5.1)
Sodium: 135 mmol/L (ref 135–145)

## 2021-08-30 LAB — AMMONIA: Ammonia: 33 umol/L (ref 9–35)

## 2021-08-30 LAB — FOLATE: Folate: 5.2 ng/mL — ABNORMAL LOW (ref >5.9)

## 2021-08-30 LAB — PROTIME-INR
INR: 1.1 (ref 0.8–1.2)
Prothrombin Time: 13.7 seconds (ref 11.4–15.2)

## 2021-08-30 LAB — MAGNESIUM: Magnesium: 1.3 mg/dL — ABNORMAL LOW (ref 1.7–2.4)

## 2021-08-30 LAB — T4, FREE: Free T4: 1.33 ng/dL — ABNORMAL HIGH (ref 0.61–1.12)

## 2021-08-30 LAB — ETHANOL: Alcohol, Ethyl (B): 27 mg/dL — ABNORMAL HIGH (ref <10)

## 2021-08-30 MED ORDER — POTASSIUM CHLORIDE 10 MEQ/100ML IV SOLN
10.0000 meq | INTRAVENOUS | Status: AC
  Administered 2021-08-30 – 2021-08-31 (×2): 10 meq via INTRAVENOUS
  Filled 2021-08-30 (×2): qty 100

## 2021-08-30 MED ORDER — THIAMINE HCL 100 MG/ML IJ SOLN
100.0000 mg | Freq: Every day | INTRAMUSCULAR | Status: DC
  Filled 2021-08-30: qty 2

## 2021-08-30 MED ORDER — LORAZEPAM 1 MG PO TABS: 1.0000 mg | ORAL_TABLET | ORAL | Status: AC | PRN

## 2021-08-30 MED ORDER — SODIUM CHLORIDE 0.9 % IV BOLUS
1000.0000 mL | Freq: Once | INTRAVENOUS | Status: AC
  Administered 2021-08-30: 1000 mL via INTRAVENOUS

## 2021-08-30 MED ORDER — HYDROCORTISONE 1 % EX CREA
TOPICAL_CREAM | CUTANEOUS | Status: DC | PRN
  Filled 2021-08-30: qty 28

## 2021-08-30 MED ORDER — SODIUM CHLORIDE 0.9 % IV SOLN: INTRAVENOUS | Status: AC

## 2021-08-30 MED ORDER — ADULT MULTIVITAMIN W/MINERALS CH
1.0000 | ORAL_TABLET | Freq: Every day | ORAL | Status: DC
  Administered 2021-08-31 – 2021-09-20 (×14): 1 via ORAL
  Filled 2021-08-30 (×15): qty 1

## 2021-08-30 MED ORDER — FOLIC ACID 1 MG PO TABS
1.0000 mg | ORAL_TABLET | Freq: Every day | ORAL | Status: DC
  Administered 2021-08-31 – 2021-09-20 (×14): 1 mg via ORAL
  Filled 2021-08-30 (×15): qty 1

## 2021-08-30 MED ORDER — LORAZEPAM 2 MG/ML IJ SOLN: 1.0000 mg | INTRAMUSCULAR | Status: AC | PRN

## 2021-08-30 MED ORDER — THIAMINE HCL 100 MG PO TABS
100.0000 mg | ORAL_TABLET | Freq: Every day | ORAL | Status: DC
  Administered 2021-08-31 – 2021-09-20 (×13): 100 mg via ORAL
  Filled 2021-08-30 (×15): qty 1

## 2021-08-30 MED ORDER — ZINC OXIDE 12.8 % EX OINT
TOPICAL_OINTMENT | CUTANEOUS | Status: DC | PRN
  Filled 2021-08-30: qty 56.7

## 2021-08-30 MED ORDER — ENOXAPARIN SODIUM 40 MG/0.4ML IJ SOSY
40.0000 mg | PREFILLED_SYRINGE | INTRAMUSCULAR | Status: DC
  Administered 2021-08-31 – 2021-09-20 (×16): 40 mg via SUBCUTANEOUS
  Filled 2021-08-30 (×20): qty 0.4

## 2021-08-30 MED ORDER — THIAMINE HCL 100 MG/ML IJ SOLN
100.0000 mg | Freq: Once | INTRAMUSCULAR | Status: AC
  Administered 2021-08-30: 100 mg via INTRAVENOUS
  Filled 2021-08-30: qty 2

## 2021-08-30 NOTE — ED Notes (Signed)
Lab called for add on for ETOH and Trop. Lab is running

## 2021-08-30 NOTE — ED Notes (Signed)
Bladder scan . MD aware. Purwick placed

## 2021-08-30 NOTE — H&P (Addendum)
History and Physical    Jamie Wilkins BPZ:[id number] DOB: 1961/07/03 DOA: 08/30/2021  PCP: [facility]  Patient coming from: Home  I have personally briefly reviewed patient's old medical records in [facility]  Chief Complaint: Home  HPI: Jamie Wilkins is a 60 y.o. female with medical history significant for Paroxysmal atrial fibrillation not on anticoagulation due to history of GI bleed, alcoholic cirrhosis, anemia who presents following a DSS initiated welfare check.  Patient reports that her and her friend called social work in order to get Meals on Wheels and for EMS to come pick her up today. Patient lives alone at home and has friends that come to check on her and bring her meals.  However on Sundays no one comes since friends go to church and she has no food. She reports not being to walk on and off since March.  Feels like she has pain from her knee down and just cannot get up off the chair.  States she has sat in her stool before waiting for someone to come clean her up since she cannot get up. Denies any numbness or tingling.  She also reports sometimes having trouble eating.  Feels like her mouth does not work right and that she has right hand numbness and tingling.  She reports smoking tobacco and drinking about 4 mixed drinks daily.  She denies any nausea, vomiting and diarrhea.  She feels depressed and has suicidal ideation.  States she wants to blow her "brains out."  She has firearms in the home and reports that she has now surrender them to her neighbor.  She previously was hospitalized in August for mechanical fall due to atrial fibrillation with RVR, orthostatic hypotension and severe electrolyte derangements including hypokalemia, hypomagnesemia and hypocalcemia.  ED Course: She was hypotensive with systolic BP of 83/63, tachycardic with a rate of 114, no leukocytosis, hemoglobin 8.6 around baseline, platelet 157.  Sodium of 135, potassium  2.5, creatinine of 0.77, BG of 85.  Calcium 7.7 with albumin pending.  Chest x-ray shows trace right pleural fluid  She was evaluated by behavior health but did not meet criteria for inpatient psychiatric admission. She was placed on IVC due to suicidal ideation by ED physician.  Review of Systems: Constitutional: No Weight Change, No Fever ENT/Mouth: No sore throat, No Rhinorrhea Eyes: No Eye Pain, No Vision Changes Cardiovascular: No Chest Pain, no SOB, Respiratory: No Cough, No Sputum  Gastrointestinal: No Nausea, No Vomiting, No Diarrhea, No Constipation, No Pain Genitourinary: no Urinary Incontinence Musculoskeletal: No Arthralgias, No Myalgias Skin: No Skin Lesions, No Pruritus, Neuro: + Weakness, No Numbness Psych: No Anxiety/Panic, No Depression, no decrease appetite Heme/Lymph: No Bruising, No Bleeding  Past Medical History:  Diagnosis Date   Asthma    Heavy menstrual period    Hypertension     Past Surgical History:  Procedure Laterality Date   ABDOMINAL HYSTERECTOMY     partial   COLONOSCOPY N/A 06/12/2021   Procedure: COLONOSCOPY;  Surgeon: Toney Reil, MD;  Location: [facility] ENDOSCOPY;  Service: Gastroenterology;  Laterality: N/A;   ESOPHAGOGASTRODUODENOSCOPY N/A 06/10/2021   Procedure: ESOPHAGOGASTRODUODENOSCOPY (EGD);  Surgeon: Toledo, Boykin Nearing, MD;  Location: [facility] ENDOSCOPY;  Service: Gastroenterology;  Laterality: N/A;   FOOT SURGERY     GIVENS CAPSULE STUDY N/A 06/12/2021   Procedure: GIVENS CAPSULE STUDY;  Surgeon: Toney Reil, MD;  Location: [facility];  Service: Gastroenterology;  Laterality: N/A;   S/P BTL  reports that she has been smoking cigarettes. She has been smoking an average of .5 packs per day. She has never used smokeless tobacco. She reports current alcohol use of about 7.0 standard drinks per week. She reports that she does not use drugs. Social History  No Known Allergies  Family History  Problem Relation Age of  Onset   CAD Father    CAD Brother    CAD Brother      Prior to Admission medications   Medication Sig Start Date End Date Taking? Authorizing Provider  ergocalciferol (VITAMIN D2) 1.25 MG (50000 UT) capsule Take 1 capsule by mouth once a week. 08/03/21 09/28/21 Yes [provider]  ferrous sulfate (FERROUSUL) 325 (65 FE) MG tablet Take 1 tablet (325 mg total) by mouth every other day. 06/12/21  Yes Wouk, Wilfred Curtis, MD  folic acid (FOLVITE) 1 MG tablet Take 1 tablet by mouth daily. 08/04/21 08/04/22 Yes [provider]  magnesium oxide (MAG-OX) 400 MG tablet Take 1 tablet by mouth daily. 08/03/21 11/11/21 Yes [provider]  Multiple Vitamins-Minerals (THERA-M) TABS Take 1 tablet by mouth daily. 08/04/21  Yes [provider]  thiamine 100 MG tablet Take 1 tablet by mouth daily. 08/04/21  Yes [provider]    Physical Exam: Vitals:   08/30/21 1936 08/30/21 1941 08/31/21 0043 08/31/21 0151  BP:  (!) 83/62 (!) 83/62 (!) 80/59  Pulse:  (!) 114 (!) 114 99  Resp:  18  17  Temp:  97.7 F (36.5 C)    TempSrc:  Oral    SpO2:  98%  93%  Weight: 62.1 kg     Height: 5\' 6"  (1.676 m)       Constitutional: NAD, calm, comfortable, elderly female appearing younger than stated age laying flat in bed Vitals:   08/30/21 1936 08/30/21 1941 08/31/21 0043 08/31/21 0151  BP:  (!) 83/62 (!) 83/62 (!) 80/59  Pulse:  (!) 114 (!) 114 99  Resp:  18  17  Temp:  97.7 F (36.5 C)    TempSrc:  Oral    SpO2:  98%  93%  Weight: 62.1 kg     Height: 5\' 6"  (1.676 m)      Eyes: PERRL, lids and conjunctivae normal ENMT: Mucous membranes are moist.  Neck: normal, supple Respiratory: clear to auscultation bilaterally, no wheezing, no crackles. Normal respiratory effort. No accessory muscle use.  Cardiovascular: Regular rate and rhythm, no murmurs / rubs / gallops. No extremity edema. 2+ pedal pulses. No carotid bruits.  Abdomen: no tenderness, no masses palpated.   Bowel sounds positive.  Musculoskeletal: no clubbing / cyanosis. No joint deformity upper and lower extremities. Good ROM, no contractures. Normal muscle tone.  Skin: large skin breakdown breakdown bleeding noted right upper buttocks adjacent to the sacrum region Neurologic: CN 2-12 grossly intact. Sensation intact,  Strength 4/5 in lower extremity Psychiatric: Normal judgment and insight. Alert and oriented x 3. Normal mood.     Labs on Admission: I have personally reviewed following labs and imaging studies  CBC: Recent Labs  Lab 08/30/21 2007  WBC 6.3  HGB 8.6*  HCT 25.3*  MCV 94.4  PLT 157   Basic Metabolic Panel: Recent Labs  Lab 08/30/21 2007 08/30/21 2051  NA 135  --   K 2.5*  --   CL 94*  --   CO2 27  --   GLUCOSE 85  --   BUN 11  --   CREATININE 0.77  --  CALCIUM 7.7*  --   MG  --  1.3*  PHOS  --  2.2*   GFR: Estimated Creatinine Clearance: 69.1 mL/min (by C-G formula based on SCr of 0.77 mg/dL). Liver Function Tests: Recent Labs  Lab 08/30/21 2051  AST 145*  ALT 48*  ALKPHOS 278*  BILITOT 1.7*  PROT 5.7*  ALBUMIN 2.0*   No results for input(s): LIPASE, AMYLASE in the last 168 hours. Recent Labs  Lab 08/30/21 2051  AMMONIA 33   Coagulation Profile: Recent Labs  Lab 08/30/21 2051  INR 1.1   Cardiac Enzymes: No results for input(s): CKTOTAL, CKMB, CKMBINDEX, TROPONINI in the last 168 hours. BNP (last 3 results) No results for input(s): PROBNP in the last 8760 hours. HbA1C: No results for input(s): HGBA1C in the last 72 hours. CBG: No results for input(s): GLUCAP in the last 168 hours. Lipid Profile: No results for input(s): CHOL, HDL, LDLCALC, TRIG, CHOLHDL, LDLDIRECT in the last 72 hours. Thyroid Function Tests: Recent Labs    08/30/21 2051  TSH 3.569  FREET4 1.33*   Anemia Panel: Recent Labs    08/30/21 2051  FOLATE 5.2*   Urine analysis: No results found for: COLORURINE, APPEARANCEUR, LABSPEC, PHURINE, GLUCOSEU, HGBUR,  BILIRUBINUR, KETONESUR, PROTEINUR, UROBILINOGEN, NITRITE, LEUKOCYTESUR  Radiological Exams on Admission: DG Chest Portable 1 View  Result Date: 08/30/2021 CLINICAL DATA:  Weakness. EXAM: PORTABLE CHEST 1 VIEW COMPARISON:  Chest x-ray 07/20/2021, CT chest 07/20/2021, chest x-ray 06/09/2021 FINDINGS: The heart and mediastinal contours are within normal limits. No focal consolidation. No pulmonary edema. Blunting of the right costophrenic angle. No left pleural effusion. No pneumothorax. Vertical linear density overlying the right lung consistent with skin folds. No acute osseous abnormality. IMPRESSION: Blunting of the right costophrenic angle with likely trace pleural effusion. Electronically Signed   By: Tish Frederickson M.D.   On: 08/30/2021 21:04      Assessment/PlaN  Hypotension - Secondary to dehydration, poor p.o. intake and heavy alcohol use -Continue aggressive IV fluid hydration -signed out to floor coverage MD to follow BP at time of my departure  Weakness -Suspect multifactorial due to electrolyte derangement and chronic heavy alcohol use - Will replete electrolytes, check vitamin B-12, folate - PT evaluation  Hypokalemia - Potassium of 2.5.  Repleted IV potassium x2  Hypomagnesemia -Mg of 1.3. Replete with IV Mg  Hypocalcemia -corrected to normal due to hypoalbuminemia  Paroxysmal atrial fibrillation - Not on anticoagulation due to history of GI bleed - Rate controlled  Alcoholic cirrhosis with ongoing alcohol use -CIWA protocol - give thiamine, folic, multivitamin  Stage II decubitus ulcer - Wound care  Suicidal ideation/depression - Patient had plans to use plan firearm in her home which she reports has been now given to her neighbor -Has been evaluated by behavioral health and was placed on IVC  Social determinant -Patient lives alone and reports that she has a son that checks on her about twice a month.  Otherwise has neighbors that come to help and cook  for her except for Sundays.  She reportedly has ongoing APS case open today on her.  Level of care: Progressive Cardiac  DVT prophylaxis:.Lovenox Code Status: Full Family Communication: Plan discussed with patient at bedside  disposition Plan: Home with observation Consults called:  Admission status: Observation     Rhodes Calvert T Katalaya Beel DO [facility]   If 7PM-7AM, please contact night-coverage [email or web address]   08/31/2021, 3:05 AM

## 2021-08-30 NOTE — ED Notes (Signed)
IVC 

## 2021-08-30 NOTE — ED Provider Notes (Signed)
[facility] Emergency Department Provider Note  ____________________________________________   Event Date/Time   First MD Initiated Contact with Patient 08/30/21 2025     (approximate)  I have reviewed the triage vital signs and the nursing notes.   HISTORY  Chief Complaint Weakness and Psychiatric Evaluation    HPI Jamie Wilkins is a 60 y.o. female with hypertension, alcohol hepatitis, ongoing alcohol abuse who comes in with concerns for weakness.  Patient complains on bedsores on buttocks who presents after DSS did a welfare check.  Patient has been chair bound for the past several weeks due to chronic leg weakness patient does have a friend who will help her with bring her food and assist in her ADLs.  Patient reports that she has been drinking and states it is only a little bit and stated that she last drank today.  She denies any chest pain, shortness of breath or abdominal pains.  She did states that when she tries to walk her legs feel weak.  Denies any numbness in her buttock region or numbness in her legs.  Patient also reports some SI, constant states that is because of her situation, nothing makes it better or worse.  Patient reports that she has a friend who was dropped off alcohol to her.  There is also concern that patient reportedly had SI and had numerous weapons removed from the home.  Patient does report EtOH use daily         Past Medical History:  Diagnosis Date   Asthma    Heavy menstrual period    Hypertension     Patient Active Problem List   Diagnosis Date Noted   PAC (premature atrial contraction)    Anemia    Atrial fibrillation with RVR (HCC) 07/20/2021   Colitis 06/12/2021   Melena    AF (paroxysmal atrial fibrillation) (HCC) 06/10/2021   Protein-calorie malnutrition, severe 06/10/2021   Hyponatremia 06/09/2021   Symptomatic anemia 06/09/2021   Malnutrition of moderate degree 02/17/2021   Syncope    Elevated  lipase    Electrolyte abnormality    Chest pain 02/15/2021   Hypocalcemia    Hypokalemia    Hypomagnesemia    Alcoholic hepatitis without ascites     Past Surgical History:  Procedure Laterality Date   ABDOMINAL HYSTERECTOMY     partial   COLONOSCOPY N/A 06/12/2021   Procedure: COLONOSCOPY;  Surgeon: Toney Reil, MD;  Location: [facility] ENDOSCOPY;  Service: Gastroenterology;  Laterality: N/A;   ESOPHAGOGASTRODUODENOSCOPY N/A 06/10/2021   Procedure: ESOPHAGOGASTRODUODENOSCOPY (EGD);  Surgeon: Toledo, Boykin Nearing, MD;  Location: [facility] ENDOSCOPY;  Service: Gastroenterology;  Laterality: N/A;   FOOT SURGERY     GIVENS CAPSULE STUDY N/A 06/12/2021   Procedure: GIVENS CAPSULE STUDY;  Surgeon: Toney Reil, MD;  Location: [facility] ENDOSCOPY;  Service: Gastroenterology;  Laterality: N/A;   S/P BTL      Prior to Admission medications   Medication Sig Start Date End Date Taking? Authorizing Provider  ferrous sulfate (FERROUSUL) 325 (65 FE) MG tablet Take 1 tablet (325 mg total) by mouth every other day. Patient not taking: Reported on 07/20/2021 06/12/21   Kathrynn Running, MD    Allergies Patient has no known allergies.  Family History  Problem Relation Age of Onset   CAD Father    CAD Brother    CAD Brother     Social History Social History   Tobacco Use   Smoking status: Every Day  Packs/day: 0.50    Types: Cigarettes   Smokeless tobacco: Never  Vaping Use   Vaping Use: Never used  Substance Use Topics   Alcohol use: Yes    Alcohol/week: 7.0 standard drinks    Types: 7 Standard drinks or equivalent per week   Drug use: Never      Review of Systems Constitutional: No fever/chills, weakness, not able to walk  Eyes: No visual changes. ENT: No sore throat. Cardiovascular: Denies chest pain. Respiratory: Denies shortness of breath. Gastrointestinal: No abdominal pain.  No nausea, no vomiting.  No diarrhea.  No constipation. Genitourinary: Negative for  dysuria. Musculoskeletal: Negative for Wilkins pain. Skin: Negative for rash. Neurological: Negative for headaches, focal weakness or numbness. All other ROS negative ____________________________________________   PHYSICAL EXAM:  VITAL SIGNS: ED Triage Vitals  Enc Vitals Group     BP 08/30/21 1941 (!) 83/62     Pulse Rate 08/30/21 1941 (!) 114     Resp 08/30/21 1941 18     Temp 08/30/21 1941 97.7 F (36.5 C)     Temp Source 08/30/21 1941 Oral     SpO2 08/30/21 1941 98 %     Weight 08/30/21 1936 137 lb (62.1 kg)     Height 08/30/21 1936 5\' 6"  (1.676 m)     Head Circumference --      Peak Flow --      Pain Score 08/30/21 1936 10     Pain Loc --      Pain Edu? --      Excl. in GC? --     Constitutional: Alert and oriented.  Appears older than stated age Eyes: Conjunctivae are normal. No swelling around eyes Head: Atraumatic. Nose: No congestion/rhinnorhea. Mouth/Throat: Mucous membranes are moist.   Neck: No stridor. Trachea Midline. FROM Cardiovascular: Normal rate, no swelling noted Respiratory: No increased wob, no stridor Gastrointestinal: Soft and nontender. No distention. No abdominal bruits.  Musculoskeletal: No lower extremity tenderness nor edema.  No joint effusions.  Able to pick both legs up off the bed with them being bent.  Denies any numbness of her legs. Neurologic:  Normal speech and language. No gross focal neurologic deficits are appreciated.  Skin:  Skin is warm, dry and intact.  Sore noted to her buttock Psychiatric:  GU: Deferred  Rectal exam: Normal rectal tone, brown stool, no saddle anesthesia ____________________________________________   LABS (all labs ordered are listed, but only abnormal results are displayed)  Labs Reviewed  CBC - Abnormal; Notable for the following components:      Result Value   RBC 2.68 (*)    Hemoglobin 8.6 (*)    HCT 25.3 (*)    RDW 17.3 (*)    nRBC 0.8 (*)    All other components within normal limits  BASIC  METABOLIC PANEL  URINALYSIS, COMPLETE (UACMP) WITH MICROSCOPIC  URINE DRUG SCREEN, QUALITATIVE ([facility] ONLY)  TROPONIN I (HIGH SENSITIVITY)   ____________________________________________   ED ECG REPORT I, 10/30/21, the attending physician, personally viewed and interpreted this ECG.  Sinus tachycardia rate of 125 without any ST elevation, T wave version lead II and V3, normal intervals with occasional PACs.  Is being read as sinus tachycardia but on the monitor it looks more like A. fib ____________________________________________  RADIOLOGY Concha Se, personally viewed and evaluated these images (plain radiographs) as part of my medical decision making, as well as reviewing the written report by the radiologist.  ED MD interpretation: No pneumonia,  may be a trace pleural effusion on the right  Official radiology report(s): DG Chest Portable 1 View  Result Date: 08/30/2021 CLINICAL DATA:  Weakness. EXAM: PORTABLE CHEST 1 VIEW COMPARISON:  Chest x-ray 07/20/2021, CT chest 07/20/2021, chest x-ray 06/09/2021 FINDINGS: The heart and mediastinal contours are within normal limits. No focal consolidation. No pulmonary edema. Blunting of the right costophrenic angle. No left pleural effusion. No pneumothorax. Vertical linear density overlying the right lung consistent with skin folds. No acute osseous abnormality. IMPRESSION: Blunting of the right costophrenic angle with likely trace pleural effusion. Electronically Signed   By: Tish Frederickson M.D.   On: 08/30/2021 21:04    ____________________________________________   PROCEDURES  Procedure(s) performed (including Critical Care):  .1-3 Lead EKG Interpretation Performed by: Concha Se, MD Authorized by: Concha Se, MD     Interpretation: abnormal     ECG rate:  114s   ECG rate assessment: tachycardic     Rhythm: atrial fibrillation     Ectopy: none     Conduction: normal      ____________________________________________   INITIAL IMPRESSION / ASSESSMENT AND PLAN / ED COURSE  Jamie Wilkins was evaluated in Emergency Department on 08/30/2021 for the symptoms described in the history of present illness. She was evaluated in the context of the global COVID-19 pandemic, which necessitated consideration that the patient might be at risk for infection with the SARS-CoV-2 virus that causes COVID-19. Institutional protocols and algorithms that pertain to the evaluation of patients at risk for COVID-19 are in a state of rapid change based on information released by regulatory bodies including the CDC and federal and state organizations. These policies and algorithms were followed during the patient's care in the ED.     Patient comes in with concern for failure to thrive.  Labs ordered evaluate for any evidence of hepatitis, electrolyte abnormalities, AKI.  Patient does have intermittently appear slightly confused so we will get ammonia level.  Will place patient under IVC given patient's not taking care of herself and concern for SI.  Patient looks like she is in A. fib with slightly elevated heart rates we will give her some fluids to see if that helps corrected given she is always in A. fib.  She denies any falls given she is nonambulatory.  Labs shows evidence of dehydration with potassium of 2.5.  Hemoglobin is stable.  We will discussed with the hospital team for admission for failure to thrive.  Do not feel there is any evidence of cord compression given she is got good rectal tone.  We did a bladder scan and there is 300 cc but she has some chronic retention  at baseline and given its not over a liter and she has no Wilkins pain I do not feel like this is the cause of her weakness.  I suspect that is more likely from the not eating, EtOH use etc.  The patient has been placed in psychiatric observation due to the need to provide a safe environment for the patient while  obtaining psychiatric consultation and evaluation, as well as ongoing medical and medication management to treat the patient's condition.  The patient has been placed under full IVC at this time.        ____________________________________________   FINAL CLINICAL IMPRESSION(S) / ED DIAGNOSES   Final diagnoses:  Failure to thrive in adult  Hypokalemia      MEDICATIONS GIVEN DURING THIS VISIT:  Medications  potassium chloride 10 mEq in  100 mL IVPB (10 mEq Intravenous New Bag/Given 08/30/21 2248)  enoxaparin (LOVENOX) injection 40 mg (has no administration in time range)  LORazepam (ATIVAN) tablet 1-4 mg (has no administration in time range)    Or  LORazepam (ATIVAN) injection 1-4 mg (has no administration in time range)  thiamine tablet 100 mg (has no administration in time range)    Or  thiamine (B-1) injection 100 mg (has no administration in time range)  folic acid (FOLVITE) tablet 1 mg (has no administration in time range)  multivitamin with minerals tablet 1 tablet (has no administration in time range)  0.9 %  sodium chloride infusion (has no administration in time range)  sodium chloride 0.9 % bolus 1,000 mL (1,000 mLs Intravenous New Bag/Given 08/30/21 2248)  thiamine (B-1) injection 100 mg (100 mg Intravenous Given 08/30/21 2253)     ED Discharge Orders     None        Note:  This document was prepared using Dragon voice recognition software and may include unintentional dictation errors.    Concha Se, MD 08/30/21 [phone]

## 2021-08-30 NOTE — ED Triage Notes (Signed)
EMS brings pt in from home per APS open case; (Joy Sumpter, [place] DSS); pt lives alone; has been sitting in chair since last Thursday, (decubital ulcers to buttocks and pt has been incontinent), not eating or drinking and reportedly SI--numerous weapons removed from home per EMS

## 2021-08-30 NOTE — ED Triage Notes (Addendum)
Pt brought in via ems from home.  Pt reports beds sores on buttocks.  Pt lives alone and has not been eating or drinking.  Pt has not walked in 3 weeks.  DSS case, guns removed from home    Pt reports SI.  Pt denies HI.  Pt admits to etoh use every day.  Denies drug use.  Pt cooperative.

## 2021-08-30 NOTE — ED Notes (Signed)
MD aware. Staff and IV team unable to get line. Will continue to assess for line and pt's

## 2021-08-30 NOTE — ED Notes (Signed)
Pt refused in/out. States she will try with purwick

## 2021-08-30 NOTE — ED Provider Notes (Signed)
Emergency Medicine Provider Triage Evaluation Note  Jamie Wilkins , a 60 y.o. female  was evaluated in triage.  Pt complains of bed sores on buttocks and presents after a DSS-initiated welfare check, via EMS from home. Patient admits to being chair-bound for the last several weeks due to chronic leg weakness. She denies any complaints of pain except to her buttocks. She notes her friend will bring food and assist with ADLs.   Review of Systems  Positive: Sacral sores, diarrhea Negative: FCS, CP  Physical Exam  BP (!) 83/62 (BP Location: Left Arm)   Pulse (!) 114   Temp 97.7 F (36.5 C) (Oral)   Resp 18   Ht 5\' 6"  (1.676 m)   Wt 62.1 kg   LMP 05/11/2010 (Approximate)   SpO2 98%   BMI 22.11 kg/m  Gen:   Awake, no distress  NAD Resp:  Normal effort CTA MSK:   Moves extremities without difficulty  Other:  ABD: distended, nontender  Medical Decision Making  Medically screening exam initiated at 8:02 PM.  Appropriate orders placed.  [name] was informed that the remainder of the evaluation will be completed by another provider, this initial triage assessment does not replace that evaluation, and the importance of remaining in the ED until their evaluation is complete.  Patient from home via EMS for welfare check initiated by DSS, with complaints of bed sores and leg weakness.    Elige Ko, PA-C 08/30/21 2007    2008, MD 08/30/21 [phone]

## 2021-08-31 DIAGNOSIS — L8990 Pressure ulcer of unspecified site, unspecified stage: Secondary | ICD-10-CM | POA: Diagnosis present

## 2021-08-31 DIAGNOSIS — R45851 Suicidal ideations: Secondary | ICD-10-CM | POA: Diagnosis present

## 2021-08-31 DIAGNOSIS — I480 Paroxysmal atrial fibrillation: Secondary | ICD-10-CM | POA: Diagnosis present

## 2021-08-31 DIAGNOSIS — E538 Deficiency of other specified B group vitamins: Secondary | ICD-10-CM | POA: Diagnosis present

## 2021-08-31 DIAGNOSIS — F0631 Mood disorder due to known physiological condition with depressive features: Secondary | ICD-10-CM

## 2021-08-31 DIAGNOSIS — D638 Anemia in other chronic diseases classified elsewhere: Secondary | ICD-10-CM | POA: Diagnosis present

## 2021-08-31 DIAGNOSIS — M79672 Pain in left foot: Secondary | ICD-10-CM | POA: Diagnosis present

## 2021-08-31 DIAGNOSIS — K7030 Alcoholic cirrhosis of liver without ascites: Secondary | ICD-10-CM | POA: Diagnosis present

## 2021-08-31 DIAGNOSIS — F1010 Alcohol abuse, uncomplicated: Secondary | ICD-10-CM | POA: Diagnosis present

## 2021-08-31 DIAGNOSIS — K7010 Alcoholic hepatitis without ascites: Secondary | ICD-10-CM | POA: Diagnosis present

## 2021-08-31 DIAGNOSIS — J45909 Unspecified asthma, uncomplicated: Secondary | ICD-10-CM | POA: Diagnosis present

## 2021-08-31 DIAGNOSIS — Y901 Blood alcohol level of 20-39 mg/100 ml: Secondary | ICD-10-CM | POA: Diagnosis present

## 2021-08-31 DIAGNOSIS — I10 Essential (primary) hypertension: Secondary | ICD-10-CM | POA: Diagnosis present

## 2021-08-31 DIAGNOSIS — F322 Major depressive disorder, single episode, severe without psychotic features: Secondary | ICD-10-CM | POA: Diagnosis present

## 2021-08-31 DIAGNOSIS — E876 Hypokalemia: Secondary | ICD-10-CM | POA: Diagnosis not present

## 2021-08-31 DIAGNOSIS — D696 Thrombocytopenia, unspecified: Secondary | ICD-10-CM | POA: Diagnosis present

## 2021-08-31 DIAGNOSIS — E860 Dehydration: Secondary | ICD-10-CM | POA: Diagnosis present

## 2021-08-31 DIAGNOSIS — K5900 Constipation, unspecified: Secondary | ICD-10-CM | POA: Diagnosis present

## 2021-08-31 DIAGNOSIS — Z20822 Contact with and (suspected) exposure to covid-19: Secondary | ICD-10-CM | POA: Diagnosis present

## 2021-08-31 DIAGNOSIS — G9341 Metabolic encephalopathy: Secondary | ICD-10-CM | POA: Diagnosis present

## 2021-08-31 DIAGNOSIS — F05 Delirium due to known physiological condition: Secondary | ICD-10-CM | POA: Diagnosis not present

## 2021-08-31 DIAGNOSIS — L89316 Pressure-induced deep tissue damage of right buttock: Secondary | ICD-10-CM | POA: Diagnosis present

## 2021-08-31 DIAGNOSIS — M79671 Pain in right foot: Secondary | ICD-10-CM | POA: Diagnosis present

## 2021-08-31 DIAGNOSIS — I9589 Other hypotension: Secondary | ICD-10-CM | POA: Diagnosis not present

## 2021-08-31 LAB — CBC
HCT: 20.5 % — ABNORMAL LOW (ref 36.0–46.0)
Hemoglobin: 7 g/dL — ABNORMAL LOW (ref 12.0–15.0)
MCH: 31.8 pg (ref 26.0–34.0)
MCHC: 34.1 g/dL (ref 30.0–36.0)
MCV: 93.2 fL (ref 80.0–100.0)
Platelets: 133 10*3/uL — ABNORMAL LOW (ref 150–400)
RBC: 2.2 MIL/uL — ABNORMAL LOW (ref 3.87–5.11)
RDW: 17.2 % — ABNORMAL HIGH (ref 11.5–15.5)
WBC: 4.7 10*3/uL (ref 4.0–10.5)
nRBC: 1.9 % — ABNORMAL HIGH (ref 0.0–0.2)

## 2021-08-31 LAB — CORTISOL-AM, BLOOD: Cortisol - AM: 18.1 ug/dL (ref 6.7–22.6)

## 2021-08-31 LAB — BASIC METABOLIC PANEL
Anion gap: 7 (ref 5–15)
BUN: 11 mg/dL (ref 8–23)
CO2: 28 mmol/L (ref 22–32)
Calcium: 6.9 mg/dL — ABNORMAL LOW (ref 8.9–10.3)
Chloride: 102 mmol/L (ref 98–111)
Creatinine, Ser: 0.72 mg/dL (ref 0.44–1.00)
GFR, Estimated: >60 mL/min (ref >60)
Glucose, Bld: 98 mg/dL (ref 70–99)
Potassium: 2.8 mmol/L — ABNORMAL LOW (ref 3.5–5.1)
Sodium: 137 mmol/L (ref 135–145)

## 2021-08-31 LAB — RESP PANEL BY RT-PCR (FLU A&B, COVID) ARPGX2
Influenza A by PCR: NEGATIVE
Influenza B by PCR: NEGATIVE
SARS Coronavirus 2 by RT PCR: NEGATIVE

## 2021-08-31 LAB — VITAMIN B12: Vitamin B-12: 1109 pg/mL — ABNORMAL HIGH (ref 180–914)

## 2021-08-31 LAB — IRON AND TIBC: Iron: 74 ug/dL (ref 28–170)

## 2021-08-31 MED ORDER — SODIUM CHLORIDE 0.9 % IV BOLUS
1000.0000 mL | Freq: Once | INTRAVENOUS | Status: AC
  Administered 2021-08-31: 1000 mL via INTRAVENOUS

## 2021-08-31 MED ORDER — ALUM & MAG HYDROXIDE-SIMETH 200-200-20 MG/5ML PO SUSP
30.0000 mL | ORAL | Status: DC | PRN
  Administered 2021-08-31: 30 mL via ORAL
  Filled 2021-08-31: qty 30

## 2021-08-31 MED ORDER — CALCIUM GLUCONATE-NACL 1-0.675 GM/50ML-% IV SOLN
1.0000 g | Freq: Once | INTRAVENOUS | Status: AC
  Administered 2021-08-31: 1000 mg via INTRAVENOUS
  Filled 2021-08-31: qty 50

## 2021-08-31 MED ORDER — POTASSIUM CHLORIDE 10 MEQ/100ML IV SOLN
10.0000 meq | INTRAVENOUS | Status: AC
Start: 2021-08-31 — End: 2021-08-31
  Administered 2021-08-31 (×4): 10 meq via INTRAVENOUS
  Filled 2021-08-31: qty 100

## 2021-08-31 MED ORDER — MAGNESIUM SULFATE 4 GM/100ML IV SOLN
4.0000 g | Freq: Once | INTRAVENOUS | Status: AC
  Administered 2021-08-31: 4 g via INTRAVENOUS
  Filled 2021-08-31: qty 100

## 2021-08-31 MED ORDER — SODIUM CHLORIDE 0.9 % IV BOLUS
500.0000 mL | Freq: Once | INTRAVENOUS | Status: AC
  Administered 2021-08-31: 500 mL via INTRAVENOUS

## 2021-08-31 MED ORDER — MAGNESIUM SULFATE 2 GM/50ML IV SOLN
2.0000 g | Freq: Once | INTRAVENOUS | Status: AC
  Administered 2021-08-31: 2 g via INTRAVENOUS
  Filled 2021-08-31: qty 50

## 2021-08-31 MED ORDER — POTASSIUM PHOSPHATES 15 MMOLE/5ML IV SOLN
15.0000 mmol | Freq: Once | INTRAVENOUS | Status: AC
  Administered 2021-08-31: 15 mmol via INTRAVENOUS
  Filled 2021-08-31: qty 5

## 2021-08-31 NOTE — Consult Note (Signed)
[facility] Face-to-Face Psychiatry Consult   Reason for Consult:Weakness and Psychiatric Evaluation Referring Physician: Dr.Funke Patient Identification: Jamie Wilkins MRN:  [id number] Principal Diagnosis: <principal problem not specified> Diagnosis:  Active Problems:   Alcoholic hepatitis without ascites   Hypotension   Depression due to physical illness   Total Time spent with patient: 45 minutes  Subjective: "I am tired living all alone without anyone to help me." Jamie Wilkins is a 60 y.o. female patient presented to [facility] ED via EMS, and she was placed under involuntary commitment status (IVC). It was reported that the patient is suicidal due to her being ill and unable to care for herself. An Adult Protected Service case has been opened Insurance account manager, [place] DSS). Per the report, the patient has been sitting in the chair since last Thursday (decubital ulcers to buttocks and pt has been incontinent), not eating or drinking, and reportedly SI--numerous weapons removed from home per EMS. On arrival at the ED, the patient's BAL is 27 mg/dl. The patient shared, "I was suicidal before I got here, but not anymore." The patient shared that she drinks on a daily. The patient shared she does not have anyone to help her care for herself. The patient shared she has three children. Only her son makes frequent visits to see her. The patient states she is unable to ambulate due to a right-side weakness. "I think I suffered a mini-stroke about a month ago."   The patient was seen face-to-face by this provider; the chart was reviewed and consulted with Dr. Fuller Plan on 08/30/2021 due to the patient's care. It was discussed with the EDP that the patient does not meet the criteria to be admitted to the psychiatric inpatient unit. The patient is suicidal due to her physical health and not having an effective care team to assist her with caring for herself. The patient can be managed on an outpatient basis  psychiatrically. On evaluation, the patient is alert and oriented x 4, calm, cooperative, and mood-congruent with affect. The patient does not appear to be responding to internal or external stimuli. Neither is the patient presenting with any delusional thinking. The patient denies auditory or visual hallucinations. The patient denies any suicidal, homicidal, or self-harm ideations. The patient states, "I was suicidal before I got here." The patient is not presenting with any psychotic or paranoid behaviors. During an encounter with the patient, she could answer questions appropriately.  HPI: Per Dr. Fuller Plan, Jamie Wilkins is a 60 y.o. female with hypertension, alcohol hepatitis, ongoing alcohol abuse who comes in with concerns for weakness.  Patient complains on bedsores on buttocks who presents after DSS did a welfare check.  Patient has been chair bound for the past several weeks due to chronic leg weakness patient does have a friend who will help her with bring her food and assist in her ADLs.  Patient reports that she has been drinking and states it is only a little bit and stated that she last drank today.  She denies any chest pain, shortness of breath or abdominal pains.  She did states that when she tries to walk her legs feel weak.  Denies any numbness in her buttock region or numbness in her legs.  Patient also reports some SI, constant states that is because of her situation, nothing makes it better or worse.  Patient reports that she has a friend who was dropped off alcohol to her.   There is also concern that patient reportedly had  SI and had numerous weapons removed from the home.  Patient does report EtOH use daily.  Past Psychiatric History: History reviewed. No pertinent past psychiatric history  Risk to Self:   Risk to Others:   Prior Inpatient Therapy:   Prior Outpatient Therapy:    Past Medical History:  Past Medical History:  Diagnosis Date   Asthma    Heavy menstrual  period    Hypertension     Past Surgical History:  Procedure Laterality Date   ABDOMINAL HYSTERECTOMY     partial   COLONOSCOPY N/A 06/12/2021   Procedure: COLONOSCOPY;  Surgeon: Toney Reil, MD;  Location: [facility] ENDOSCOPY;  Service: Gastroenterology;  Laterality: N/A;   ESOPHAGOGASTRODUODENOSCOPY N/A 06/10/2021   Procedure: ESOPHAGOGASTRODUODENOSCOPY (EGD);  Surgeon: Toledo, Boykin Nearing, MD;  Location: [facility] ENDOSCOPY;  Service: Gastroenterology;  Laterality: N/A;   FOOT SURGERY     GIVENS CAPSULE STUDY N/A 06/12/2021   Procedure: GIVENS CAPSULE STUDY;  Surgeon: Toney Reil, MD;  Location: [facility] ENDOSCOPY;  Service: Gastroenterology;  Laterality: N/A;   S/P BTL     Family History:  Family History  Problem Relation Age of Onset   CAD Father    CAD Brother    CAD Brother    Family Psychiatric  History:  Social History:  Social History   Substance and Sexual Activity  Alcohol Use Yes   Alcohol/week: 7.0 standard drinks   Types: 7 Standard drinks or equivalent per week     Social History   Substance and Sexual Activity  Drug Use Never    Social History   Socioeconomic History   Marital status: Divorced    Spouse name: Not on file   Number of children: Not on file   Years of education: Not on file   Highest education level: Not on file  Occupational History   Not on file  Tobacco Use   Smoking status: Every Day    Packs/day: 0.50    Types: Cigarettes   Smokeless tobacco: Never  Vaping Use   Vaping Use: Never used  Substance and Sexual Activity   Alcohol use: Yes    Alcohol/week: 7.0 standard drinks    Types: 7 Standard drinks or equivalent per week   Drug use: Never   Sexual activity: Not on file  Other Topics Concern   Not on file  Social History Narrative   Not on file   Social Determinants of Health   Financial Resource Strain: Not on file  Food Insecurity: Not on file  Transportation Needs: Not on file  Physical Activity: Not on file   Stress: Not on file  Social Connections: Not on file   Additional Social History:    Allergies:  No Known Allergies  Labs:  Results for orders placed or performed during the hospital encounter of 08/30/21 (from the past 48 hour(s))  Basic metabolic panel     Status: Abnormal   Collection Time: 08/30/21  8:07 PM  Result Value Ref Range   Sodium 135 135 - 145 mmol/L   Potassium 2.5 (LL) 3.5 - 5.1 mmol/L    Comment: CRITICAL RESULT CALLED TO, READ BACK BY AND VERIFIED WITH KATHERINE SIMMERSON @2050  08/30/21 MJU    Chloride 94 (L) 98 - 111 mmol/L   CO2 27 22 - 32 mmol/L   Glucose, Bld 85 70 - 99 mg/dL    Comment: Glucose reference range applies only to samples taken after fasting for at least 8 hours.   BUN 11 8 -  23 mg/dL   Creatinine, Ser 1.61 0.44 - 1.00 mg/dL   Calcium 7.7 (L) 8.9 - 10.3 mg/dL   GFR, Estimated >09 >60 mL/min    Comment: (NOTE) Calculated using the CKD-EPI Creatinine Equation (2021)    Anion gap 14 5 - 15    Comment: Performed at [facility], [address] [place], [place] [address]  CBC     Status: Abnormal   Collection Time: 08/30/21  8:07 PM  Result Value Ref Range   WBC 6.3 4.0 - 10.5 K/uL   RBC 2.68 (L) 3.87 - 5.11 MIL/uL   Hemoglobin 8.6 (L) 12.0 - 15.0 g/dL   HCT 81.1 (L) 91.4 - 78.2 %   MCV 94.4 80.0 - 100.0 fL   MCH 32.1 26.0 - 34.0 pg   MCHC 34.0 30.0 - 36.0 g/dL   RDW 95.6 (H) 21.3 - 08.6 %   Platelets 157 150 - 400 K/uL   nRBC 0.8 (H) 0.0 - 0.2 %    Comment: Performed at [facility] [place], [address], [place], [place] [address]  Troponin I (High Sensitivity)     Status: Abnormal   Collection Time: 08/30/21  8:07 PM  Result Value Ref Range   Troponin I (High Sensitivity) 23 (H) <18 ng/L    Comment: (NOTE) Elevated high sensitivity troponin I (hsTnI) values and significant  changes across serial measurements may suggest ACS but many other  chronic and acute conditions are known to elevate hsTnI results.  Refer to  the "Links" section for chest pain algorithms and additional  guidance. Performed at [facility], [address] [place], [place] [address]   Ethanol     Status: Abnormal   Collection Time: 08/30/21  8:08 PM  Result Value Ref Range   Alcohol, Ethyl (B) 27 (H) <10 mg/dL    Comment: (NOTE) Lowest detectable limit for serum alcohol is 10 mg/dL.  For medical purposes only. Performed at [facility], [address] [place], [place] [address]   Hepatic function panel     Status: Abnormal   Collection Time: 08/30/21  8:51 PM  Result Value Ref Range   Total Protein 5.7 (L) 6.5 - 8.1 g/dL   Albumin 2.0 (L) 3.5 - 5.0 g/dL   AST 244 (H) 15 - 41 U/L   ALT 48 (H) 0 - 44 U/L   Alkaline Phosphatase 278 (H) 38 - 126 U/L   Total Bilirubin 1.7 (H) 0.3 - 1.2 mg/dL   Bilirubin, Direct 0.7 (H) 0.0 - 0.2 mg/dL   Indirect Bilirubin 1.0 (H) 0.3 - 0.9 mg/dL    Comment: Performed at [facility], [address] [place], [place] [address]  Ammonia     Status: None   Collection Time: 08/30/21  8:51 PM  Result Value Ref Range   Ammonia 33 9 - 35 umol/L    Comment: Performed at [facility], [address] [place], [place] [address]  TSH     Status: None   Collection Time: 08/30/21  8:51 PM  Result Value Ref Range   TSH 3.569 0.350 - 4.500 uIU/mL    Comment: Performed by a 3rd Generation assay with a functional sensitivity of <=0.01 uIU/mL. Performed at [facility], [address] [place], [place] [address]   T4, free     Status: Abnormal   Collection Time: 08/30/21  8:51 PM  Result Value Ref Range   Free T4 1.33 (H) 0.61 - 1.12 ng/dL  Comment: (NOTE) Biotin ingestion may interfere with free T4 tests. If the results are inconsistent with the TSH level, previous test results, or the clinical presentation, then consider biotin interference. If needed, order repeat testing after stopping biotin. Performed at [facility], [address] [place], [place] [address]   Magnesium     Status: Abnormal   Collection Time: 08/30/21  8:51 PM  Result Value Ref Range   Magnesium 1.3 (L) 1.7 - 2.4 mg/dL    Comment: Performed at [facility], [address] [place], [place] [address]  Phosphorus     Status: Abnormal   Collection Time: 08/30/21  8:51 PM  Result Value Ref Range   Phosphorus 2.2 (L) 2.5 - 4.6 mg/dL    Comment: Performed at [facility], [address] [place], [place] [address]  Protime-INR     Status: None   Collection Time: 08/30/21  8:51 PM  Result Value Ref Range   Prothrombin Time 13.7 11.4 - 15.2 seconds   INR 1.1 0.8 - 1.2    Comment: (NOTE) INR goal varies based on device and disease states. Performed at [facility], [address] [place], [place] [address]   Folate, serum, performed at [facility] lab     Status: Abnormal   Collection Time: 08/30/21  8:51 PM  Result Value Ref Range   Folate 5.2 (L) >5.9 ng/mL    Comment: Performed at [facility], [address] [place], [place] [address]  Troponin I (High Sensitivity)     Status: Abnormal   Collection Time: 08/30/21 10:50 PM  Result Value Ref Range   Troponin I (High Sensitivity) 23 (H) <18 ng/L    Comment: (NOTE) Elevated high sensitivity troponin I (hsTnI) values and significant  changes across serial measurements may suggest ACS but many other  chronic and acute conditions are known to elevate hsTnI results.  Refer to the "Links" section for chest pain algorithms and additional  guidance. Performed at [facility], [address] [place], [place] [address]   Resp Panel by RT-PCR (Flu A&B, Covid) Nasopharyngeal Swab     Status: None   Collection Time: 08/30/21 11:25 PM   Specimen: Nasopharyngeal Swab; Nasopharyngeal(NP) swabs in vial transport medium  Result Value Ref Range   SARS Coronavirus 2 by RT PCR NEGATIVE NEGATIVE    Comment: (NOTE) SARS-CoV-2 target nucleic acids  are NOT DETECTED.  The SARS-CoV-2 RNA is generally detectable in upper respiratory specimens during the acute phase of infection. The lowest concentration of SARS-CoV-2 viral copies this assay can detect is 138 copies/mL. A negative result does not preclude SARS-Cov-2 infection and should not be used as the sole basis for treatment or other patient management decisions. A negative result may occur with  improper specimen collection/handling, submission of specimen other than nasopharyngeal swab, presence of viral mutation(s) within the areas targeted by this assay, and inadequate number of viral copies(<138 copies/mL). A negative result must be combined with clinical observations, patient history, and epidemiological information. The expected result is Negative.  Fact Sheet for Patients:  BloggerCourse.com  Fact Sheet for Healthcare Providers:  SeriousBroker.it  This test is no t yet approved or cleared by the [place] FDA and  has been authorized for detection and/or diagnosis of SARS-CoV-2 by FDA under an Emergency Use Authorization (EUA). This EUA will remain  in effect (meaning this test can be used) for the duration of the COVID-19 declaration under Section 564(b)(1) of the Act,  21 U.S.C.section 360bbb-3(b)(1), unless the authorization is terminated  or revoked sooner.       Influenza A by PCR NEGATIVE NEGATIVE   Influenza B by PCR NEGATIVE NEGATIVE    Comment: (NOTE) The Xpert Xpress SARS-CoV-2/FLU/RSV plus assay is intended as an aid in the diagnosis of influenza from Nasopharyngeal swab specimens and should not be used as a sole basis for treatment. Nasal washings and aspirates are unacceptable for Xpert Xpress SARS-CoV-2/FLU/RSV testing.  Fact Sheet for Patients: BloggerCourse.com  Fact Sheet for Healthcare Providers: SeriousBroker.it  This test is not yet  approved or cleared by the [place] FDA and has been authorized for detection and/or diagnosis of SARS-CoV-2 by FDA under an Emergency Use Authorization (EUA). This EUA will remain in effect (meaning this test can be used) for the duration of the COVID-19 declaration under Section 564(b)(1) of the Act, 21 U.S.C. section 360bbb-3(b)(1), unless the authorization is terminated or revoked.  Performed at [facility], [address] [place], [place] [address]     Current Facility-Administered Medications  Medication Dose Route Frequency Provider Last Rate Last Admin   0.9 %  sodium chloride infusion   Intravenous Continuous Benita Gutter T, DO 100 mL/hr at 08/31/21 0015 New Bag at 08/31/21 0015   enoxaparin (LOVENOX) injection 40 mg  40 mg Subcutaneous Q24H Tu, Ching T, DO   40 mg at 08/31/21 0022   folic acid (FOLVITE) tablet 1 mg  1 mg Oral Daily Tu, Ching T, DO       hydrocortisone cream 1 %   Topical PRN Tu, Ching T, DO       LORazepam (ATIVAN) tablet 1-4 mg  1-4 mg Oral Q1H PRN Tu, Ching T, DO       Or   LORazepam (ATIVAN) injection 1-4 mg  1-4 mg Intravenous Q1H PRN Tu, Ching T, DO       multivitamin with minerals tablet 1 tablet  1 tablet Oral Daily Tu, Ching T, DO       potassium chloride 10 mEq in 100 mL IVPB  10 mEq Intravenous Q1 Hr x 2 Concha Se, MD 100 mL/hr at 08/31/21 0016 10 mEq at 08/31/21 0016   thiamine tablet 100 mg  100 mg Oral Daily Tu, Ching T, DO       Or   thiamine (B-1) injection 100 mg  100 mg Intravenous Daily Tu, Ching T, DO       Zinc Oxide (TRIPLE PASTE) 12.8 % ointment   Topical PRN Tu, Ching T, DO       Current Outpatient Medications  Medication Sig Dispense Refill   ergocalciferol (VITAMIN D2) 1.25 MG (50000 UT) capsule Take 1 capsule by mouth once a week.     ferrous sulfate (FERROUSUL) 325 (65 FE) MG tablet Take 1 tablet (325 mg total) by mouth every other day. 60 tablet 1   folic acid (FOLVITE) 1 MG tablet Take 1 tablet by mouth daily.      magnesium oxide (MAG-OX) 400 MG tablet Take 1 tablet by mouth daily.     Multiple Vitamins-Minerals (THERA-M) TABS Take 1 tablet by mouth daily.     thiamine 100 MG tablet Take 1 tablet by mouth daily.      Musculoskeletal: Strength & Muscle Tone: within normal limits Gait & Station: normal Patient leans: N/A  Psychiatric Specialty Exam:  Presentation  General Appearance: Appropriate for Environment  Eye Contact:Good  Speech:Clear and Coherent  Speech Volume:Normal  Handedness:Right   Mood  and Affect  Mood:Depressed  Affect:Congruent; Blunt; Depressed   Thought Process  Thought Processes:Coherent  Descriptions of Associations:Intact  Orientation:Full (Time, Place and Person)  Thought Content:Logical  History of Schizophrenia/Schizoaffective disorder:No data recorded Duration of Psychotic Symptoms:No data recorded Hallucinations:Hallucinations: None  Ideas of Reference:None  Suicidal Thoughts:Suicidal Thoughts: No  Homicidal Thoughts:Homicidal Thoughts: No   Sensorium  Memory:Immediate Good; Recent Good; Remote Good  Judgment:Poor  Insight:Poor   Executive Functions  Concentration:Fair  Attention Span:Fair  Recall:Fair  Fund of Knowledge:Fair  Language:Fair   Psychomotor Activity  Psychomotor Activity:Psychomotor Activity: Normal   Assets  Assets:Communication Skills; Desire for Improvement; Physical Health; Resilience; Social Support   Sleep  Sleep:Sleep: Good Number of Hours of Sleep: 8   Physical Exam: Physical Exam Vitals and nursing note reviewed.  Constitutional:      Appearance: Normal appearance. She is normal weight.  HENT:     Head: Normocephalic and atraumatic.     Nose: Nose normal.     Mouth/Throat:     Mouth: Mucous membranes are moist.  Cardiovascular:     Rate and Rhythm: Tachycardia present.  Pulmonary:     Effort: Pulmonary effort is normal.  Musculoskeletal:        General: Normal range of motion.      Cervical back: Normal range of motion and neck supple.  Neurological:     Mental Status: She is alert and oriented to person, place, and time.     Motor: Weakness present.  Psychiatric:        Attention and Perception: Attention and perception normal.        Mood and Affect: Mood is depressed. Affect is blunt.        Speech: Speech normal.        Behavior: Behavior is cooperative.        Thought Content: Thought content normal.        Cognition and Memory: Cognition normal.        Judgment: Judgment normal.   Review of Systems  Psychiatric/Behavioral:  Positive for depression.   All other systems reviewed and are negative. Blood pressure (!) 83/62, pulse (!) 114, temperature 97.7 F (36.5 C), temperature source Oral, resp. rate 18, height 5\' 6"  (1.676 m), weight 62.1 kg, last menstrual period 05/11/2010, SpO2 98 %. Body mass index is 22.11 kg/m.  Treatment Plan Summary: Plan The patient can be manage psychiatrically on an outpatient basis  Disposition: No evidence of imminent risk to self or others at present.   Patient does not meet criteria for psychiatric inpatient admission. Supportive therapy provided about ongoing stressors. Refer to IOP. Discussed crisis plan, support from social network, calling 911, coming to the Emergency Department, and calling Suicide Hotline.  05/13/2010, NP 08/31/2021 1:09 AM

## 2021-08-31 NOTE — Progress Notes (Signed)
PROGRESS NOTE    Jamie Wilkins  PJK:[id number] DOB: 05/21/1961 DOA: 08/30/2021 PCP: [facility]    Brief Narrative:  Jamie Wilkins is a 60 y.o. female with medical history significant for Paroxysmal atrial fibrillation not on anticoagulation due to history of GI bleed, alcoholic cirrhosis, anemia who presents following a DSS initiated welfare check..  It appears that the patient has a suicide ideation, has not been taking care of herself, she has no food, she was sitting in her feces, not  cleaning herself up.  She continues drink 4 mixed drinks every day. Upon arriving the emergency room, she was hypotensive, had a serum significant hypokalemia, hypomagnesemia and hypocalcemia.  She was given fluids, her electrolytes were repleted.  She was also seen by psychiatry.   Assessment & Plan:   Principal Problem:   Hypotension Active Problems:   Hypocalcemia   Hypokalemia   Hypomagnesemia   Alcoholic hepatitis without ascites   AF (paroxysmal atrial fibrillation) (HCC)   Depression due to physical illness   Decubitus ulcer  Hypotension secondary to dehydration. Hypokalemia secondary to poor p.o. intake. Hypomagnesemia  Hypocalcemia. Hypophosphatemia. This appears to be secondary to poor p.o. intake, patient has not been able to take care of herself. She will be given IV potassium, calcium, magnesium and phosphate.  Paroxysmal atrial fibrillation. Rate under control, no anticoagulation due to history of GI bleed.  Alcohol cirrhosis. Alcohol use disorder. Continue CIWA protocol.  Suicidal ideation. Patient has been evaluated by psychiatry. Patient probably need to admit to psych unit after medical conditions improved.  Anemia. Thrombocytopenia. Level normal, no active bleeding.  We will recheck and transfuse when hemoglobin less than 7.  DVT prophylaxis: Lovenox Code Status: full Family Communication:  Disposition Plan:    Status is:  Observation    Dispo: The patient is from: Home              Anticipated d/c is to: Pending              Patient currently is not medically stable to d/c.   Difficult to place patient No        I/O last 3 completed shifts: In: 3650 [IV Piggyback:3650] Out: -  No intake/output data recorded.     Consultants:  Psychiatry  Procedures: None  Antimicrobials: None  Subjective: Patient feels depressed, otherwise has no complaints. She denies any short of breath or cough. No abdominal pain or nausea vomiting. No dysuria hematuria.  Objective: Vitals:   08/31/21 0945 08/31/21 1000 08/31/21 1015 08/31/21 1025  BP: 99/69 97/65 95/71  94/70  Pulse: 100 96 94 100  Resp: 15 17 18    Temp:      TempSrc:      SpO2: 94% 93% 94%   Weight:      Height:        Intake/Output Summary (Last 24 hours) at 08/31/2021 1338 Last data filed at 08/31/2021 10/31/2021 Gross per 24 hour  Intake 3650 ml  Output --  Net 3650 ml   Filed Weights   08/30/21 1936  Weight: 62.1 kg    Examination:  General exam: Appears calm and comfortable  Respiratory system: Clear to auscultation. Respiratory effort normal. Cardiovascular system: S1 & S2 heard, RRR. No JVD, murmurs, rubs, gallops or clicks. No pedal edema. Gastrointestinal system: Abdomen is nondistended, soft and nontender. No organomegaly or masses felt. Normal bowel sounds heard. Central nervous system: Alert and oriented. No focal neurological deficits. Extremities: Symmetric 5 x 5 power.  Skin: No rashes, lesions or ulcers Psychiatry: Flat affect    Data Reviewed: I have personally reviewed following labs and imaging studies  CBC: Recent Labs  Lab 08/30/21 2007 08/31/21 0621  WBC 6.3 4.7  HGB 8.6* 7.0*  HCT 25.3* 20.5*  MCV 94.4 93.2  PLT 157 133*   Basic Metabolic Panel: Recent Labs  Lab 08/30/21 2007 08/30/21 2051 08/31/21 0621  NA 135  --  137  K 2.5*  --  2.8*  CL 94*  --  102  CO2 27  --  28  GLUCOSE 85  --   98  BUN 11  --  11  CREATININE 0.77  --  0.72  CALCIUM 7.7*  --  6.9*  MG  --  1.3*  --   PHOS  --  2.2*  --    GFR: Estimated Creatinine Clearance: 69.1 mL/min (by C-G formula based on SCr of 0.72 mg/dL). Liver Function Tests: Recent Labs  Lab 08/30/21 2051  AST 145*  ALT 48*  ALKPHOS 278*  BILITOT 1.7*  PROT 5.7*  ALBUMIN 2.0*   No results for input(s): LIPASE, AMYLASE in the last 168 hours. Recent Labs  Lab 08/30/21 2051  AMMONIA 33   Coagulation Profile: Recent Labs  Lab 08/30/21 2051  INR 1.1   Cardiac Enzymes: No results for input(s): CKTOTAL, CKMB, CKMBINDEX, TROPONINI in the last 168 hours. BNP (last 3 results) No results for input(s): PROBNP in the last 8760 hours. HbA1C: No results for input(s): HGBA1C in the last 72 hours. CBG: No results for input(s): GLUCAP in the last 168 hours. Lipid Profile: No results for input(s): CHOL, HDL, LDLCALC, TRIG, CHOLHDL, LDLDIRECT in the last 72 hours. Thyroid Function Tests: Recent Labs    08/30/21 2051  TSH 3.569  FREET4 1.33*   Anemia Panel: Recent Labs    08/30/21 2051 08/31/21 0621  VITAMINB12 1,109*  --   FOLATE 5.2*  --   TIBC  --  NOT CALCULATED  IRON  --  74   Sepsis Labs: No results for input(s): PROCALCITON, LATICACIDVEN in the last 168 hours.  Recent Results (from the past 240 hour(s))  Resp Panel by RT-PCR (Flu A&B, Covid) Nasopharyngeal Swab     Status: None   Collection Time: 08/30/21 11:25 PM   Specimen: Nasopharyngeal Swab; Nasopharyngeal(NP) swabs in vial transport medium  Result Value Ref Range Status   SARS Coronavirus 2 by RT PCR NEGATIVE NEGATIVE Final    Comment: (NOTE) SARS-CoV-2 target nucleic acids are NOT DETECTED.  The SARS-CoV-2 RNA is generally detectable in upper respiratory specimens during the acute phase of infection. The lowest concentration of SARS-CoV-2 viral copies this assay can detect is 138 copies/mL. A negative result does not preclude SARS-Cov-2 infection  and should not be used as the sole basis for treatment or other patient management decisions. A negative result may occur with  improper specimen collection/handling, submission of specimen other than nasopharyngeal swab, presence of viral mutation(s) within the areas targeted by this assay, and inadequate number of viral copies(<138 copies/mL). A negative result must be combined with clinical observations, patient history, and epidemiological information. The expected result is Negative.  Fact Sheet for Patients:  BloggerCourse.com  Fact Sheet for Healthcare Providers:  SeriousBroker.it  This test is no t yet approved or cleared by the [place] FDA and  has been authorized for detection and/or diagnosis of SARS-CoV-2 by FDA under an Emergency Use Authorization (EUA). This EUA will remain  in effect (meaning this  test can be used) for the duration of the COVID-19 declaration under Section 564(b)(1) of the Act, 21 U.S.C.section 360bbb-3(b)(1), unless the authorization is terminated  or revoked sooner.       Influenza A by PCR NEGATIVE NEGATIVE Final   Influenza B by PCR NEGATIVE NEGATIVE Final    Comment: (NOTE) The Xpert Xpress SARS-CoV-2/FLU/RSV plus assay is intended as an aid in the diagnosis of influenza from Nasopharyngeal swab specimens and should not be used as a sole basis for treatment. Nasal washings and aspirates are unacceptable for Xpert Xpress SARS-CoV-2/FLU/RSV testing.  Fact Sheet for Patients: BloggerCourse.com  Fact Sheet for Healthcare Providers: SeriousBroker.it  This test is not yet approved or cleared by the [place] FDA and has been authorized for detection and/or diagnosis of SARS-CoV-2 by FDA under an Emergency Use Authorization (EUA). This EUA will remain in effect (meaning this test can be used) for the duration of the COVID-19 declaration  under Section 564(b)(1) of the Act, 21 U.S.C. section 360bbb-3(b)(1), unless the authorization is terminated or revoked.  Performed at [facility], [address], [place], [place] [address]          Radiology Studies: DG Chest Portable 1 View  Result Date: 08/30/2021 CLINICAL DATA:  Weakness. EXAM: PORTABLE CHEST 1 VIEW COMPARISON:  Chest x-ray 07/20/2021, CT chest 07/20/2021, chest x-ray 06/09/2021 FINDINGS: The heart and mediastinal contours are within normal limits. No focal consolidation. No pulmonary edema. Blunting of the right costophrenic angle. No left pleural effusion. No pneumothorax. Vertical linear density overlying the right lung consistent with skin folds. No acute osseous abnormality. IMPRESSION: Blunting of the right costophrenic angle with likely trace pleural effusion. Electronically Signed   By: Tish Frederickson M.D.   On: 08/30/2021 21:04        Scheduled Meds:  enoxaparin (LOVENOX) injection  40 mg Subcutaneous Q24H   folic acid  1 mg Oral Daily   multivitamin with minerals  1 tablet Oral Daily   thiamine  100 mg Oral Daily   Or   thiamine  100 mg Intravenous Daily   Continuous Infusions:  sodium chloride 100 mL/hr at 08/31/21 1021   potassium PHOSPHATE IVPB (in mmol)       LOS: 0 days    Time spent: 32 minutes    Marrion Coy, MD [facility]   To contact the attending provider between 7A-7P or the covering provider during after hours 7P-7A, please log into the web site [email or web address] and access using universal [place] password for that web site. If you do not have the password, please call the hospital operator.  08/31/2021, 1:38 PM

## 2021-08-31 NOTE — BH Assessment (Signed)
Comprehensive Clinical Assessment (CCA) Note  08/31/2021 Jamie Wilkins [id number] Recommendations for Services/Supports/Treatments: Consulted with Jamie Wilkins., NP, who determined pt. does not meet inpatient criteria. Notified Jamie Wilkins and Jamie Contras, RN of disposition recommendation.  Jamie Wilkins is a 60 year old, English speaking, black female with no significant mental health history. Pt presented [facility] ED by EMS due to pt sitting in her chair since last Thursday. Pt responded with, "I sit in a recliner all day; what kind of life is that?" when asked what brought her to the hospital. Per chart review, pt has an open APS case (Jamie Wilkins, [place] [employer]), lives alone, and has not walked in a week. Per nursing notes, pt has ulcers to buttocks and has been incontinent. Pt was oriented x4 and was calm/cooperative throughout the assessment. Pt had good insight, explaining that her feelings of depression are directly linked to her declining health, lack of support, and failure to thrive. Pt reported that she has three children, but none of them have been able to care for her, despite having a son who frequently comes home from [place] to assist. Pt expressed suspicions about having had a ministroke. Pt had unremarkable psychomotor activity. Pt had clear/coherent speech and her thought processes were linear/intact. Pt presented with a responsive affect and a sad mood. Pt did not appeared to be responding to internal stimuli. Pt admitted to occasional, passive SI however the patient denied current SI, HI or AV/H. Pt's BAL result was 27.    Chief Complaint:  Chief Complaint  Patient presents with   Weakness   Psychiatric Evaluation   Visit Diagnosis: Alcoholic hepatitis without ascites   Hypotension   Depression due to physical illness      CCA Screening, Triage and Referral (STR)  Patient Reported Information How did you hear about [place]? Other (Comment) (EMS)  Referral name: No data  recorded Referral phone number: No data recorded  Whom do you see for routine medical problems? No data recorded Practice/Facility Name: No data recorded Practice/Facility Phone Number: No data recorded Name of Contact: No data recorded Contact Number: No data recorded Contact Fax Number: No data recorded Prescriber Name: No data recorded Prescriber Address (if known): No data recorded  What Is the Reason for Your Visit/Call Today? SI  How Long Has This Been Causing You Problems? > than 6 months  What Do You Feel Would Help You the Most Today? Social Support   Have You Recently Been in Any Inpatient Treatment (Hospital/Detox/Crisis Center/28-Day Program)? No data recorded Name/Location of Program/Hospital:No data recorded How Long Were You There? No data recorded When Were You Discharged? No data recorded  Have You Ever Received Services From [facility] Before? No data recorded Who Do You See at [facility]? No data recorded  Have You Recently Had Any Thoughts About Hurting Yourself? Yes  Are You Planning to Commit Suicide/Harm Yourself At This time? No   Have you Recently Had Thoughts About Hurting Someone Jamie Wilkins? No  Explanation: No data recorded  Have You Used Any Alcohol or Drugs in the Past 24 Hours? No  How Long Ago Did You Use Drugs or Alcohol? No data recorded What Did You Use and How Much? No data recorded  Do You Currently Have a Therapist/Psychiatrist? No  Name of Therapist/Psychiatrist: No data recorded  Have You Been Recently Discharged From Any Office Practice or Programs? No  Explanation of Discharge From Practice/Program: No data recorded    CCA Screening Triage Referral Assessment Type of  Contact: Face-to-Face  Is this Initial or Reassessment? No data recorded Date Telepsych consult ordered in CHL:  No data recorded Time Telepsych consult ordered in CHL:  No data recorded  Patient Reported Information Reviewed? No data recorded Patient Left  Without Being Seen? No data recorded Reason for Not Completing Assessment: No data recorded  Collateral Involvement: None provided   Does Patient Have a Court Appointed Legal Guardian? No data recorded Name and Contact of Legal Guardian: No data recorded If Minor and Not Living with Parent(s), Who has Custody? No data recorded Is CPS involved or ever been involved? Never  Is APS involved or ever been involved? Currently   Patient Determined To Be At Risk for Harm To Self or Others Based on Review of Patient Reported Information or Presenting Complaint? No  Method: No data recorded Availability of Means: No data recorded Intent: No data recorded Notification Required: No data recorded Additional Information for Danger to Others Potential: No data recorded Additional Comments for Danger to Others Potential: No data recorded Are There Guns or Other Weapons in Your Home? No data recorded Types of Guns/Weapons: No data recorded Are These Weapons Safely Secured?                            No data recorded Who Could Verify You Are Able To Have These Secured: No data recorded Do You Have any Outstanding Charges, Pending Court Dates, Parole/Probation? No data recorded Contacted To Inform of Risk of Harm To Self or Others: No data recorded  Location of Assessment: [facility] ED   Does Patient Present under Involuntary Commitment? Yes  IVC Papers Initial File Date: 08/30/21   [place] of Residence: [place]   Patient Currently Receiving the Following Services: Not Receiving Services   Determination of Need: Emergent (2 hours)   Options For Referral: ALF/SNF; Outpatient Therapy     CCA Biopsychosocial Intake/Chief Complaint:  No data recorded Current Symptoms/Problems: No data recorded  Patient Reported Schizophrenia/Schizoaffective Diagnosis in Past: No   Strengths: Pt has good insight and is communicative  Preferences: No data recorded Abilities: No data recorded  Type of  Services Patient Feels are Needed: No data recorded  Initial Clinical Notes/Concerns: No data recorded  Mental Health Symptoms Depression:   Hopelessness; Worthlessness   Duration of Depressive symptoms:  Greater than two weeks   Mania:   None   Anxiety:    Tension; Worrying   Psychosis:   None   Duration of Psychotic symptoms: No data recorded  Trauma:   N/A   Obsessions:   N/A   Compulsions:   N/A   Inattention:   None   Hyperactivity/Impulsivity:   None   Oppositional/Defiant Behaviors:   None   Emotional Irregularity:   None   Other Mood/Personality Symptoms:  No data recorded   Mental Status Exam Appearance and self-care  Stature:   Average   Weight:   Average weight   Clothing:   -- (In hospital gown)   Grooming:   Normal   Cosmetic use:   None   Posture/gait:   Normal   Motor activity:   Not Remarkable   Sensorium  Attention:   Normal   Concentration:   Normal   Orientation:   X5   Recall/memory:   Normal   Affect and Mood  Affect:   Depressed   Mood:   Depressed   Relating  Eye contact:   Normal   Facial  expression:   Responsive   Attitude toward examiner:   Cooperative   Thought and Language  Speech flow:  Clear and Coherent   Thought content:   Appropriate to Mood and Circumstances   Preoccupation:   None   Hallucinations:   None   Organization:  No data recorded  [employer] of Knowledge:   Average   Intelligence:   Average   Abstraction:   Functional   Judgement:   Common-sensical   Reality Testing:   Adequate   Insight:   Good   Decision Making:   Normal   Social Functioning  Social Maturity:   Responsible   Social Judgement:   Normal   Stress  Stressors:   Illness; Transitions   Coping Ability:   Overwhelmed   Skill Deficits:   Activities of daily living   Supports:   Family; Support needed; Friends/Service system      Religion: Religion/Spirituality Are You A Religious Person?:  Industrial/product designer)  Leisure/Recreation: Leisure / Recreation Do You Have Hobbies?: No  Exercise/Diet: Exercise/Diet Do You Exercise?: No Have You Gained or Lost A Significant Amount of Weight in the Past Six Months?: No Do You Follow a Special Diet?: No Do You Have Any Trouble Sleeping?: No   CCA Employment/Education Employment/Work Situation: Employment / Work Systems developer:  (Not assessed) Has Patient ever Been in Equities trader?: No  Education: Education Is Patient Currently Attending School?: No Did Theme park manager?: No Did You Have An Individualized Education Program (IIEP): No Did You Have Any Difficulty At [employer]?: No Patient's Education Has Been Impacted by Current Illness: No   CCA Family/Childhood History Family and Relationship History: Family history Marital status: Divorced Divorced, when?:  Industrial/product designer) What types of issues is patient dealing with in the relationship?:  (UTA) Additional relationship information:  (UTA) Does patient have children?: Yes How many children?: 3 How is patient's relationship with their children?: Pt reported that one of her children is in [place] and comes to help her frequently  Childhood History:  Childhood History By whom was/is the patient raised?:  (UTA) Did patient suffer any verbal/emotional/physical/sexual abuse as a child?: No Did patient suffer from severe childhood neglect?: No Was the patient ever a victim of a crime or a disaster?: No Witnessed domestic violence?: No Has patient been affected by domestic violence as an adult?: No  Child/Adolescent Assessment:     CCA Substance Use Alcohol/Drug Use: Alcohol / Drug Use Pain Medications: See MAR Prescriptions: See MAR Over the Counter: See MAR History of alcohol / drug use?: Yes Longest period of sobriety (when/how long): Unknown Negative Consequences of Use: Personal  relationships Withdrawal Symptoms: None                         ASAM's:  Six Dimensions of Multidimensional Assessment  Dimension 1:  Acute Intoxication and/or Withdrawal Potential:   Dimension 1:  Description of individual's past and current experiences of substance use and withdrawal: Pt has a hx of alcohol abuse  Dimension 2:  Biomedical Conditions and Complications:      Dimension 3:  Emotional, Behavioral, or Cognitive Conditions and Complications:     Dimension 4:  Readiness to Change:     Dimension 5:  Relapse, Continued use, or Continued Problem Potential:     Dimension 6:  Recovery/Living Environment:     ASAM Severity Score: ASAM's Severity Rating Score: 12  ASAM Recommended Level of Treatment: ASAM Recommended Level  of Treatment: Level I Outpatient Treatment   Substance use Disorder (SUD) Substance Use Disorder (SUD)  Checklist Symptoms of Substance Use: Continued use despite having a persistent/recurrent physical/psychological problem caused/exacerbated by use, Continued use despite persistent or recurrent social, interpersonal problems, caused or exacerbated by use  Recommendations for Services/Supports/Treatments: Recommendations for Services/Supports/Treatments Recommendations For Services/Supports/Treatments: Individual Therapy  DSM5 Diagnoses: Patient Active Problem List   Diagnosis Date Noted   Depression due to physical illness 08/31/2021   Decubitus ulcer 08/31/2021   Hypotension 08/30/2021   PAC (premature atrial contraction)    Anemia    Atrial fibrillation with RVR (HCC) 07/20/2021   Colitis 06/12/2021   Melena    AF (paroxysmal atrial fibrillation) (HCC) 06/10/2021   Protein-calorie malnutrition, severe 06/10/2021   Hyponatremia 06/09/2021   Symptomatic anemia 06/09/2021   Malnutrition of moderate degree 02/17/2021   Syncope    Elevated lipase    Electrolyte abnormality    Chest pain 02/15/2021   Hypocalcemia    Hypokalemia     Hypomagnesemia    Alcoholic hepatitis without ascites     Rorey Bisson R Raydon Chappuis, LCAS

## 2021-08-31 NOTE — ED Notes (Signed)
Pt in bed eating and talking with nurse.

## 2021-08-31 NOTE — ED Notes (Signed)
Brandon RN aware of assigned bed °

## 2021-08-31 NOTE — ED Notes (Signed)
Pt turned on lt side

## 2021-08-31 NOTE — ED Notes (Signed)
Labs reviewed with MD. MD wants labs draw for tomorrow and aware of change needed to time

## 2021-08-31 NOTE — ED Notes (Signed)
Pt alert and calm. BP low. NADN MD aware
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
## 2021-08-31 NOTE — ED Notes (Signed)
IVC/pending medical admission ?

## 2021-08-31 NOTE — Consult Note (Signed)
[facility] Face-to-Face Psychiatry Consult   Reason for Consult: Consult for 60 year old woman with a history of multiple medical problems brought to the hospital after APS did an investigation and found that she had not gotten out of a chair for a week and was not taking care of herself Referring Physician: Chipper Herb Patient Identification: Jamie Wilkins MRN:  [id number] Principal Diagnosis: Severe major depression, single episode, without psychotic features (HCC) Diagnosis:  Principal Problem:   Severe major depression, single episode, without psychotic features (HCC) Active Problems:   Hypocalcemia   Hypokalemia   Hypomagnesemia   Alcoholic hepatitis without ascites   AF (paroxysmal atrial fibrillation) (HCC)   Hypotension   Depression due to physical illness   Decubitus ulcer   Total Time spent with patient: 1 hour  Subjective:   Jamie Wilkins is a 60 y.o. female patient admitted with "it was no way to live so I thought I would kill myself".  HPI: Patient seen chart reviewed.  60 year old woman with multiple medical problems who lives by herself.  Patient tells me that she is no longer able to stand up on her own or walk.  She says she cannot remember how long it is going on and it seems like it has been almost a year.  She is apparently become so disabled and weak that she cannot even move herself.  APS got involved and found that the patient had not gotten out of her chair for about a week.  She was soiled in the obvious way and had bed sores.  Patient says she has been extremely depressed.  Feels like her medical problems are overwhelming and so she feels hopeless and has decided she wanted to kill her self.  Had thoughts about shooting herself and apparently actually had guns in the house.  Sleep has been very erratic.  Appetite is poor and she has lost a great deal of weight.  She denies hallucinations or psychotic symptoms.  Patient was pleasant and cooperative flat affect but not  argumentative.  Short-term memory has some impairment she could only remember 1 out of 3 things at a few minutes but she was oriented and alert to the situation  Past Psychiatric History: Patient says she has had symptoms of depression on and off for years but apparently it has not been identified as a clear problem.  No history that I can identify of specific treatment.  She has a history also of alcohol abuse and has a diagnosis of alcoholic hepatitis and cirrhosis.  She says that she still drinks and it takes her about a week to go through a 750 mL bottle.  Risk to Self:   Risk to Others:   Prior Inpatient Therapy:   Prior Outpatient Therapy:    Past Medical History:  Past Medical History:  Diagnosis Date   Asthma    Heavy menstrual period    Hypertension     Past Surgical History:  Procedure Laterality Date   ABDOMINAL HYSTERECTOMY     partial   COLONOSCOPY N/A 06/12/2021   Procedure: COLONOSCOPY;  Surgeon: Toney Reil, MD;  [facility] ENDOSCOPY;  Service: Gastroenterology;  Laterality: N/A;   ESOPHAGOGASTRODUODENOSCOPY N/A 06/10/2021   Procedure: ESOPHAGOGASTRODUODENOSCOPY (EGD);  Surgeon: Toledo, Boykin Nearing, MD;  Location: [facility] ENDOSCOPY;  Service: Gastroenterology;  Laterality: N/A;   FOOT SURGERY     GIVENS CAPSULE STUDY N/A 06/12/2021   Procedure: GIVENS CAPSULE STUDY;  Surgeon: Toney Reil, MD;  Location: [facility] ENDOSCOPY;  Service:  Gastroenterology;  Laterality: N/A;   S/P BTL     Family History:  Family History  Problem Relation Age of Onset   CAD Father    CAD Brother    CAD Brother    Family Psychiatric  History: She says she had a sister with depression Social History:  Social History   Substance and Sexual Activity  Alcohol Use Yes   Alcohol/week: 7.0 standard drinks   Types: 7 Standard drinks or equivalent per week     Social History   Substance and Sexual Activity  Drug Use Never    Social History   Socioeconomic History   Marital  status: Divorced    Spouse name: Not on file   Number of children: Not on file   Years of education: Not on file   Highest education level: Not on file  Occupational History   Not on file  Tobacco Use   Smoking status: Every Day    Packs/day: 0.50    Types: Cigarettes   Smokeless tobacco: Never  Vaping Use   Vaping Use: Never used  Substance and Sexual Activity   Alcohol use: Yes    Alcohol/week: 7.0 standard drinks    Types: 7 Standard drinks or equivalent per week   Drug use: Never   Sexual activity: Not on file  Other Topics Concern   Not on file  Social History Narrative   Not on file   Social Determinants of Health   Financial Resource Strain: Not on file  Food Insecurity: Not on file  Transportation Needs: Not on file  Physical Activity: Not on file  Stress: Not on file  Social Connections: Not on file   Additional Social History:    Allergies:  No Known Allergies  Labs:  Results for orders placed or performed during the hospital encounter of 08/30/21 (from the past 48 hour(s))  Basic metabolic panel     Status: Abnormal   Collection Time: 08/30/21  8:07 PM  Result Value Ref Range   Sodium 135 135 - 145 mmol/L   Potassium 2.5 (LL) 3.5 - 5.1 mmol/L    Comment: CRITICAL RESULT CALLED TO, READ BACK BY AND VERIFIED WITH Jamie Wilkins @2050  08/30/21 MJU    Chloride 94 (L) 98 - 111 mmol/L   CO2 27 22 - 32 mmol/L   Glucose, Bld 85 70 - 99 mg/dL    Comment: Glucose reference range applies only to samples taken after fasting for at least 8 hours.   BUN 11 8 - 23 mg/dL   Creatinine, Ser 10/30/21 0.44 - 1.00 mg/dL   Calcium 7.7 (L) 8.9 - 10.3 mg/dL   GFR, Estimated 5.36 >64 mL/min    Comment: (NOTE) Calculated using the CKD-EPI Creatinine Equation (2021)    Anion gap 14 5 - 15    Comment: Performed at [facility], [address] [place], [place]  CBC     Status: Abnormal   Collection Time: 08/30/21  8:07 PM  Result Value Ref Range    WBC 6.3 4.0 - 10.5 K/uL   RBC 2.68 (L) 3.87 - 5.11 MIL/uL   Hemoglobin 8.6 (L) 12.0 - 15.0 g/dL   HCT 10/30/21 (L) 59.5 - 63.8 %   MCV 94.4 80.0 - 100.0 fL   MCH 32.1 26.0 - 34.0 pg   MCHC 34.0 30.0 - 36.0 g/dL   RDW 75.6 (H) 43.3 - 29.5 %   Platelets 157 150 - 400 K/uL   nRBC 0.8 (H) 0.0 -  0.2 %    Comment: Performed at [facility], [address] [place], [place] [address]  Troponin I (High Sensitivity)     Status: Abnormal   Collection Time: 08/30/21  8:07 PM  Result Value Ref Range   Troponin I (High Sensitivity) 23 (H) <18 ng/L    Comment: (NOTE) Elevated high sensitivity troponin I (hsTnI) values and significant  changes across serial measurements may suggest ACS but many other  chronic and acute conditions are known to elevate hsTnI results.  Refer to the "Links" section for chest pain algorithms and additional  guidance. Performed at [facility], [address] [place], [place] [address]   Ethanol     Status: Abnormal   Collection Time: 08/30/21  8:08 PM  Result Value Ref Range   Alcohol, Ethyl (B) 27 (H) <10 mg/dL    Comment: (NOTE) Lowest detectable limit for serum alcohol is 10 mg/dL.  For medical purposes only. Performed at [facility], [address] [place], [place] [address]   Hepatic function panel     Status: Abnormal   Collection Time: 08/30/21  8:51 PM  Result Value Ref Range   Total Protein 5.7 (L) 6.5 - 8.1 g/dL   Albumin 2.0 (L) 3.5 - 5.0 g/dL   AST 322 (H) 15 - 41 U/L   ALT 48 (H) 0 - 44 U/L   Alkaline Phosphatase 278 (H) 38 - 126 U/L   Total Bilirubin 1.7 (H) 0.3 - 1.2 mg/dL   Bilirubin, Direct 0.7 (H) 0.0 - 0.2 mg/dL   Indirect Bilirubin 1.0 (H) 0.3 - 0.9 mg/dL    Comment: Performed at [facility], [address] [place], [place] [address]  Ammonia     Status: None   Collection Time: 08/30/21  8:51 PM  Result Value Ref Range   Ammonia 33 9 - 35 umol/L    Comment: Performed at [facility],  [address] [place], [place] [address]  TSH     Status: None   Collection Time: 08/30/21  8:51 PM  Result Value Ref Range   TSH 3.569 0.350 - 4.500 uIU/mL    Comment: Performed by a 3rd Generation assay with a functional sensitivity of <=0.01 uIU/mL. Performed at [facility], [address] [place], [place] [address]   T4, free     Status: Abnormal   Collection Time: 08/30/21  8:51 PM  Result Value Ref Range   Free T4 1.33 (H) 0.61 - 1.12 ng/dL    Comment: (NOTE) Biotin ingestion may interfere with free T4 tests. If the results are inconsistent with the TSH level, previous test results, or the clinical presentation, then consider biotin interference. If needed, order repeat testing after stopping biotin. Performed at [facility], [address] [place], [place] [address]   Magnesium     Status: Abnormal   Collection Time: 08/30/21  8:51 PM  Result Value Ref Range   Magnesium 1.3 (L) 1.7 - 2.4 mg/dL    Comment: Performed at [facility], [address] [place], [place] [address]  Phosphorus     Status: Abnormal   Collection Time: 08/30/21  8:51 PM  Result Value Ref Range   Phosphorus 2.2 (L) 2.5 - 4.6 mg/dL    Comment: Performed at [facility], [address] [place], [place] [address]  Protime-INR     Status: None   Collection Time: 08/30/21  8:51 PM  Result Value Ref Range   Prothrombin  Time 13.7 11.4 - 15.2 seconds   INR 1.1 0.8 - 1.2    Comment: (NOTE) INR goal varies based on device and disease states. Performed at [facility], [address] [place], [place] [address]   Vitamin B12     Status: Abnormal   Collection Time: 08/30/21  8:51 PM  Result Value Ref Range   Vitamin B-12 1,109 (H) 180 - 914 pg/mL    Comment: (NOTE) This assay is not validated for testing neonatal or myeloproliferative syndrome specimens for Vitamin B12 levels. Performed at [facility], [address] [place],  [place] [address]   Folate, serum, performed at [facility]     Status: Abnormal   Collection Time: 08/30/21  8:51 PM  Result Value Ref Range   Folate 5.2 (L) >5.9 ng/mL    Comment: Performed at [facility], [address] [place], [place] [address]  Troponin I (High Sensitivity)     Status: Abnormal   Collection Time: 08/30/21 10:50 PM  Result Value Ref Range   Troponin I (High Sensitivity) 23 (H) <18 ng/L    Comment: (NOTE) Elevated high sensitivity troponin I (hsTnI) values and significant  changes across serial measurements may suggest ACS but many other  chronic and acute conditions are known to elevate hsTnI results.  Refer to the "Links" section for chest pain algorithms and additional  guidance. Performed at [facility], [address] [place], [place] [address]   Resp Panel by RT-PCR (Flu A&B, Covid) Nasopharyngeal Swab     Status: None   Collection Time: 08/30/21 11:25 PM   Specimen: Nasopharyngeal Swab; Nasopharyngeal(NP) swabs in vial transport medium  Result Value Ref Range   SARS Coronavirus 2 by RT PCR NEGATIVE NEGATIVE    Comment: (NOTE) SARS-CoV-2 target nucleic acids are NOT DETECTED.  The SARS-CoV-2 RNA is generally detectable in upper respiratory specimens during the acute phase of infection. The lowest concentration of SARS-CoV-2 viral copies this assay can detect is 138 copies/mL. A negative result does not preclude SARS-Cov-2 infection and should not be used as the sole basis for treatment or other patient management decisions. A negative result may occur with  improper specimen collection/handling, submission of specimen other than nasopharyngeal swab, presence of viral mutation(s) within the areas targeted by this assay, and inadequate number of viral copies(<138 copies/mL). A negative result must be combined with clinical observations, patient history, and epidemiological information. The expected result is Negative.  Fact Sheet  for Patients:  BloggerCourse.com  Fact Sheet for Healthcare Providers:  SeriousBroker.it  This test is no t yet approved or cleared by the [place] FDA and  has been authorized for detection and/or diagnosis of SARS-CoV-2 by FDA under an Emergency Use Authorization (EUA). This EUA will remain  in effect (meaning this test can be used) for the duration of the COVID-19 declaration under Section 564(b)(1) of the Act, 21 U.S.C.section 360bbb-3(b)(1), unless the authorization is terminated  or revoked sooner.       Influenza A by PCR NEGATIVE NEGATIVE   Influenza B by PCR NEGATIVE NEGATIVE    Comment: (NOTE) The Xpert Xpress SARS-CoV-2/FLU/RSV plus assay is intended as an aid in the diagnosis of influenza from Nasopharyngeal swab specimens and should not be used as a sole basis for treatment. Nasal washings and aspirates are unacceptable for Xpert Xpress SARS-CoV-2/FLU/RSV testing.  Fact Sheet for Patients: BloggerCourse.com  Fact Sheet for Healthcare Providers: SeriousBroker.it  This test is not yet approved or  cleared by the [place] and has been authorized for detection and/or diagnosis of SARS-CoV-2 by FDA under an Emergency Use Authorization (EUA). This EUA will remain in effect (meaning this test can be used) for the duration of the COVID-19 declaration under Section 564(b)(1) of the Act, 21 U.S.C. section 360bbb-3(b)(1), unless the authorization is terminated or revoked.  Performed at [facility], [address] [place], [place] [address]   CBC     Status: Abnormal   Collection Time: 08/31/21  6:21 AM  Result Value Ref Range   WBC 4.7 4.0 - 10.5 K/uL   RBC 2.20 (L) 3.87 - 5.11 MIL/uL   Hemoglobin 7.0 (L) 12.0 - 15.0 g/dL   HCT 56.2 (L) 13.0 - 86.5 %   MCV 93.2 80.0 - 100.0 fL   MCH 31.8 26.0 - 34.0 pg   MCHC 34.1 30.0 - 36.0 g/dL   RDW 78.4  (H) 69.6 - 15.5 %   Platelets 133 (L) 150 - 400 K/uL   nRBC 1.9 (H) 0.0 - 0.2 %    Comment: Performed at [facility], [address] [place], [place] [address]  Basic metabolic panel     Status: Abnormal   Collection Time: 08/31/21  6:21 AM  Result Value Ref Range   Sodium 137 135 - 145 mmol/L   Potassium 2.8 (L) 3.5 - 5.1 mmol/L   Chloride 102 98 - 111 mmol/L   CO2 28 22 - 32 mmol/L   Glucose, Bld 98 70 - 99 mg/dL    Comment: Glucose reference range applies only to samples taken after fasting for at least 8 hours.   BUN 11 8 - 23 mg/dL   Creatinine, Ser 4.13 0.44 - 1.00 mg/dL   Calcium 6.9 (L) 8.9 - 10.3 mg/dL   GFR, Estimated >24 >40 mL/min    Comment: (NOTE) Calculated using the CKD-EPI Creatinine Equation (2021)    Anion gap 7 5 - 15    Comment: Performed at [facility], [address] [place], [place] [address]  Iron and TIBC     Status: None   Collection Time: 08/31/21  6:21 AM  Result Value Ref Range   Iron 74 28 - 170 ug/dL   TIBC NOT CALCULATED 536 - 450 ug/dL   Saturation Ratios NOT CALCULATED 10.4 - 31.8 %   UIBC NOT CALCULATED ug/dL    Comment: Performed at [facility], [address], [place], [place] [address]    Current Facility-Administered Medications  Medication Dose Route Frequency Provider Last Rate Last Admin   0.9 %  sodium chloride infusion   Intravenous Continuous Tu, Ching T, DO 100 mL/hr at 08/31/21 1644 Infusion Verify at 08/31/21 1644   enoxaparin (LOVENOX) injection 40 mg  40 mg Subcutaneous Q24H Tu, Ching T, DO   40 mg at 08/31/21 0022   folic acid (FOLVITE) tablet 1 mg  1 mg Oral Daily Tu, Ching T, DO   1 mg at 08/31/21 1020   hydrocortisone cream 1 %   Topical PRN Tu, Ching T, DO       LORazepam (ATIVAN) tablet 1-4 mg  1-4 mg Oral Q1H PRN Tu, Ching T, DO       Or   LORazepam (ATIVAN) injection 1-4 mg  1-4 mg Intravenous Q1H PRN Tu, Ching T, DO       multivitamin with minerals tablet 1 tablet  1 tablet Oral  Daily Tu, Ching T, DO   1 tablet at 08/31/21  1020   potassium PHOSPHATE 15 mmol in dextrose 5 % 250 mL infusion  15 mmol Intravenous Once Marrion Coy, MD 43 mL/hr at 08/31/21 1626 Restarted at 08/31/21 1626   thiamine tablet 100 mg  100 mg Oral Daily Tu, Ching T, DO   100 mg at 08/31/21 1020   Or   thiamine (B-1) injection 100 mg  100 mg Intravenous Daily Tu, Ching T, DO       Zinc Oxide (TRIPLE PASTE) 12.8 % ointment   Topical PRN Tu, Ching T, DO        Musculoskeletal: Strength & Muscle Tone: decreased Gait & Station: unable to stand Patient leans: N/A            Psychiatric Specialty Exam:  Presentation  General Appearance: Appropriate for Environment  Eye Contact:Good  Speech:Clear and Coherent  Speech Volume:Normal  Handedness:Right   Mood and Affect  Mood:Depressed  Affect:Congruent; Blunt; Depressed   Thought Process  Thought Processes:Coherent  Descriptions of Associations:Intact  Orientation:Full (Time, Place and Person)  Thought Content:Logical  History of Schizophrenia/Schizoaffective disorder:No  Duration of Psychotic Symptoms:No data recorded Hallucinations:Hallucinations: None  Ideas of Reference:None  Suicidal Thoughts:Suicidal Thoughts: No  Homicidal Thoughts:Homicidal Thoughts: No   Sensorium  Memory:Immediate Good; Recent Good; Remote Good  Judgment:Poor  Insight:Poor   Executive Functions  Concentration:Fair  Attention Span:Fair  Recall:Fair  Fund of Knowledge:Fair  Language:Fair   Psychomotor Activity  Psychomotor Activity:Psychomotor Activity: Normal   Assets  Assets:Communication Skills; Desire for Improvement; Physical Health; Resilience; Social Support   Sleep  Sleep:Sleep: Good Number of Hours of Sleep: 8   Physical Exam: Physical Exam Vitals and nursing note reviewed.  Constitutional:      Appearance: Normal appearance. She is ill-appearing.  HENT:     Head: Normocephalic and atraumatic.      Mouth/Throat:     Pharynx: Oropharynx is clear.  Eyes:     Pupils: Pupils are equal, round, and reactive to light.  Cardiovascular:     Rate and Rhythm: Normal rate and regular rhythm.  Pulmonary:     Effort: Pulmonary effort is normal.     Breath sounds: Normal breath sounds.  Abdominal:     General: Abdomen is flat.     Palpations: Abdomen is soft.  Musculoskeletal:        General: Normal range of motion.  Skin:    General: Skin is warm and dry.  Neurological:     General: No focal deficit present.     Mental Status: She is alert. Mental status is at baseline.  Psychiatric:        Attention and Perception: Attention normal.        Mood and Affect: Mood is depressed.        Speech: Speech normal.        Behavior: Behavior is cooperative.        Thought Content: Thought content includes suicidal ideation. Thought content includes suicidal plan.        Cognition and Memory: Memory is impaired.        Judgment: Judgment is impulsive.   Review of Systems  Constitutional:  Positive for malaise/fatigue and weight loss.  HENT: Negative.    Eyes: Negative.   Respiratory: Negative.    Cardiovascular: Negative.   Gastrointestinal: Negative.   Musculoskeletal: Negative.   Skin: Negative.   Neurological: Negative.   Psychiatric/Behavioral:  Positive for depression, memory loss and suicidal ideas. Negative for hallucinations and substance abuse. The patient has insomnia. The  patient is not nervous/anxious.   Blood pressure 101/77, pulse 93, temperature 97.9 F (36.6 C), temperature source Oral, resp. rate 20, height 5\' 6"  (1.676 m), weight 62.1 kg, last menstrual period 05/11/2010, SpO2 100 %. Body mass index is 22.11 kg/m.  Treatment Plan Summary: Medication management and Plan 60 year old woman multiple medical problems.  Some memory impairment but not as cognitively impaired as I was thinking she might be.  Patient clearly needs treatment for depression.  Reviewed with her the  importance of treating the depression as well as the medical problems.  Propose starting mirtazapine initially low-dose with a plan to gradually increase it.  Side effects were risks and benefits discussed and the patient is agreeable.  We will follow up regularly.  Patient says she has no intention of hurting herself in the hospital.  She is still being kept with a sitter in the room but we will reassess that in an ongoing way.  Disposition: Supportive therapy provided about ongoing stressors. See note for treatment plan  76, MD 08/31/2021 6:02 PM

## 2021-08-31 NOTE — ED Notes (Signed)
MD messaged on V/S. Pt moved up in bed and reports feeling fine

## 2021-08-31 NOTE — ED Notes (Signed)
MD messaged for primary RN as patient continues to have persistent worsening hypotension. This RN expresses concern for perfusion r/t MAP of 62. Awaiting new orders. MD & Primary RN notified via secure chat.

## 2021-09-01 ENCOUNTER — Encounter: Payer: Self-pay | Admitting: Internal Medicine

## 2021-09-01 LAB — CBC WITH DIFFERENTIAL/PLATELET
Abs Immature Granulocytes: 0.03 10*3/uL (ref 0.00–0.07)
Basophils Absolute: 0 10*3/uL (ref 0.0–0.1)
Basophils Relative: 0 %
Eosinophils Absolute: 0 10*3/uL (ref 0.0–0.5)
Eosinophils Relative: 1 %
HCT: 22.6 % — ABNORMAL LOW (ref 36.0–46.0)
Hemoglobin: 7.5 g/dL — ABNORMAL LOW (ref 12.0–15.0)
Immature Granulocytes: 1 %
Lymphocytes Relative: 31 %
Lymphs Abs: 1.7 10*3/uL (ref 0.7–4.0)
MCH: 30.7 pg (ref 26.0–34.0)
MCHC: 33.2 g/dL (ref 30.0–36.0)
MCV: 92.6 fL (ref 80.0–100.0)
Monocytes Absolute: 0.8 10*3/uL (ref 0.1–1.0)
Monocytes Relative: 16 %
Neutro Abs: 2.8 10*3/uL (ref 1.7–7.7)
Neutrophils Relative %: 51 %
Platelets: 156 10*3/uL (ref 150–400)
RBC: 2.44 MIL/uL — ABNORMAL LOW (ref 3.87–5.11)
RDW: 18 % — ABNORMAL HIGH (ref 11.5–15.5)
WBC: 5.4 10*3/uL (ref 4.0–10.5)
nRBC: 2.2 % — ABNORMAL HIGH (ref 0.0–0.2)

## 2021-09-01 LAB — COMPREHENSIVE METABOLIC PANEL
ALT: 39 U/L (ref 0–44)
AST: 97 U/L — ABNORMAL HIGH (ref 15–41)
Albumin: 1.7 g/dL — ABNORMAL LOW (ref 3.5–5.0)
Alkaline Phosphatase: 233 U/L — ABNORMAL HIGH (ref 38–126)
Anion gap: 7 (ref 5–15)
BUN: 10 mg/dL (ref 8–23)
CO2: 27 mmol/L (ref 22–32)
Calcium: 7.5 mg/dL — ABNORMAL LOW (ref 8.9–10.3)
Chloride: 101 mmol/L (ref 98–111)
Creatinine, Ser: 0.68 mg/dL (ref 0.44–1.00)
GFR, Estimated: >60 mL/min (ref >60)
Glucose, Bld: 104 mg/dL — ABNORMAL HIGH (ref 70–99)
Potassium: 3.6 mmol/L (ref 3.5–5.1)
Sodium: 135 mmol/L (ref 135–145)
Total Bilirubin: 1.1 mg/dL (ref 0.3–1.2)
Total Protein: 4.9 g/dL — ABNORMAL LOW (ref 6.5–8.1)

## 2021-09-01 LAB — PHOSPHORUS: Phosphorus: 1.9 mg/dL — ABNORMAL LOW (ref 2.5–4.6)

## 2021-09-01 LAB — MAGNESIUM: Magnesium: 2.3 mg/dL (ref 1.7–2.4)

## 2021-09-01 MED ORDER — BISACODYL 5 MG PO TBEC
5.0000 mg | DELAYED_RELEASE_TABLET | Freq: Once | ORAL | Status: AC
  Administered 2021-09-01: 5 mg via ORAL
  Filled 2021-09-01: qty 1

## 2021-09-01 MED ORDER — POTASSIUM CHLORIDE IN NACL 20-0.9 MEQ/L-% IV SOLN
INTRAVENOUS | Status: DC
  Filled 2021-09-01 (×2): qty 1000

## 2021-09-01 MED ORDER — MIRTAZAPINE 15 MG PO TABS
15.0000 mg | ORAL_TABLET | Freq: Every day | ORAL | Status: DC
  Administered 2021-09-01: 15 mg via ORAL
  Filled 2021-09-01: qty 1

## 2021-09-01 MED ORDER — POLYETHYLENE GLYCOL 3350 17 G PO PACK
17.0000 g | PACK | Freq: Every day | ORAL | Status: DC
  Administered 2021-09-01 – 2021-09-04 (×3): 17 g via ORAL
  Filled 2021-09-01 (×11): qty 1

## 2021-09-01 MED ORDER — K PHOS MONO-SOD PHOS DI & MONO 155-852-130 MG PO TABS
500.0000 mg | ORAL_TABLET | Freq: Two times a day (BID) | ORAL | Status: AC
  Administered 2021-09-01 – 2021-09-03 (×4): 500 mg via ORAL
  Filled 2021-09-01 (×4): qty 2

## 2021-09-01 NOTE — Progress Notes (Signed)
   09/01/21 0441  Assess: MEWS Score  Temp 98 F (36.7 C)  BP (!) 85/66  Pulse Rate (!) 103  Resp 18  SpO2 98 %  O2 Device Room Air  Assess: MEWS Score  MEWS Temp 0  MEWS Systolic 1  MEWS Pulse 1  MEWS RR 0  MEWS LOC 0  MEWS Score 2  MEWS Score Color Yellow  Assess: if the MEWS score is Yellow or Red  Were vital signs taken at a resting state? No  Focused Assessment No change from prior assessment  Does the patient meet 2 or more of the SIRS criteria? No  MEWS guidelines implemented *See Row Information* No, vital signs rechecked  Treat  MEWS Interventions Other (Comment) (reach out to MD re BP)  Pain Scale 0-10  Pain Score 0  Notify: Charge Nurse/RN  Name of Charge Nurse/RN Notified Lillia Abed  Date Charge Nurse/RN Notified 09/01/21  Time Charge Nurse/RN Notified 0448  Notify: Provider  Provider Name/Title DR Valente David  Date Provider Notified 09/01/21  Time Provider Notified [phone]  Notification Type Page  Notification Reason  (BP trending down)  Assess: SIRS CRITERIA  SIRS Temperature  0  SIRS Pulse 1  SIRS Respirations  0  SIRS WBC 0  SIRS Score Sum  1

## 2021-09-01 NOTE — Consult Note (Signed)
[facility] Face-to-Face Psychiatry Consult   Reason for Consult: Follow-up consult for 60 year old woman with depression.  Patient seen and chart reviewed Referring Physician:  Myriam Forehand Patient Identification: Jamie Wilkins MRN:  [id number] Principal Diagnosis: Severe major depression, single episode, without psychotic features (HCC) Diagnosis:  Principal Problem:   Severe major depression, single episode, without psychotic features (HCC) Active Problems:   Hypocalcemia   Hypokalemia   Hypomagnesemia   Alcoholic hepatitis without ascites   AF (paroxysmal atrial fibrillation) (HCC)   Hypotension   Depression due to physical illness   Decubitus ulcer   Total Time spent with patient: 30 minutes  Subjective:   Jamie Wilkins is a 60 y.o. female patient admitted with "I am doing better".  HPI: Patient reports she is feeling better than she was yesterday.  Not as depressed.  She says she has been told she can go to a rehab and this makes her happy.  She says she has eaten a little bit and tried to participate with physical therapy.  Denies acute suicidal thought  Past Psychiatric History: History of past recurrent depression and alcohol abuse.  Risk to Self:   Risk to Others:   Prior Inpatient Therapy:   Prior Outpatient Therapy:    Past Medical History:  Past Medical History:  Diagnosis Date   Asthma    Heavy menstrual period    Hypertension     Past Surgical History:  Procedure Laterality Date   ABDOMINAL HYSTERECTOMY     partial   COLONOSCOPY N/A 06/12/2021   Procedure: COLONOSCOPY;  Surgeon: Toney Reil, MD;  Location: [facility] ENDOSCOPY;  Service: Gastroenterology;  Laterality: N/A;   ESOPHAGOGASTRODUODENOSCOPY N/A 06/10/2021   Procedure: ESOPHAGOGASTRODUODENOSCOPY (EGD);  Surgeon: Toledo, Boykin Nearing, MD;  Location: [facility] ENDOSCOPY;  Service: Gastroenterology;  Laterality: N/A;   FOOT SURGERY     GIVENS CAPSULE STUDY N/A 06/12/2021   Procedure: GIVENS CAPSULE STUDY;   Surgeon: Toney Reil, MD;  Location: [facility] ENDOSCOPY;  Service: Gastroenterology;  Laterality: N/A;   S/P BTL     Family History:  Family History  Problem Relation Age of Onset   CAD Father    CAD Brother    CAD Brother    Family Psychiatric  History: None reported Social History:  Social History   Substance and Sexual Activity  Alcohol Use Yes   Alcohol/week: 7.0 standard drinks   Types: 7 Standard drinks or equivalent per week     Social History   Substance and Sexual Activity  Drug Use Never    Social History   Socioeconomic History   Marital status: Divorced    Spouse name: Not on file   Number of children: Not on file   Years of education: Not on file   Highest education level: Not on file  Occupational History   Not on file  Tobacco Use   Smoking status: Every Day    Packs/day: 0.50    Types: Cigarettes   Smokeless tobacco: Never  Vaping Use   Vaping Use: Never used  Substance and Sexual Activity   Alcohol use: Yes    Alcohol/week: 7.0 standard drinks    Types: 7 Standard drinks or equivalent per week   Drug use: Never   Sexual activity: Not on file  Other Topics Concern   Not on file  Social History Narrative   Not on file   Social Determinants of Health   Financial Resource Strain: Not on file  Food Insecurity: Not on file  Transportation Needs: Not on file  Physical Activity: Not on file  Stress: Not on file  Social Connections: Not on file   Additional Social History:    Allergies:  No Known Allergies  Labs:  Results for orders placed or performed during the hospital encounter of 08/30/21 (from the past 48 hour(s))  Basic metabolic panel     Status: Abnormal   Collection Time: 08/30/21  8:07 PM  Result Value Ref Range   Sodium 135 135 - 145 mmol/L   Potassium 2.5 (LL) 3.5 - 5.1 mmol/L    Comment: CRITICAL RESULT CALLED TO, READ BACK BY AND VERIFIED WITH KATHERINE SIMMERSON @2050  08/30/21 MJU    Chloride 94 (L) 98 - 111  mmol/L   CO2 27 22 - 32 mmol/L   Glucose, Bld 85 70 - 99 mg/dL    Comment: Glucose reference range applies only to samples taken after fasting for at least 8 hours.   BUN 11 8 - 23 mg/dL   Creatinine, Ser 10/30/21 0.44 - 1.00 mg/dL   Calcium 7.7 (L) 8.9 - 10.3 mg/dL   GFR, Estimated 8.54 >62 mL/min    Comment: (NOTE) Calculated using the CKD-EPI Creatinine Equation (2021)    Anion gap 14 5 - 15    Comment: Performed at [facility], [address] [place], [place]  CBC     Status: Abnormal   Collection Time: 08/30/21  8:07 PM  Result Value Ref Range   WBC 6.3 4.0 - 10.5 K/uL   RBC 2.68 (L) 3.87 - 5.11 MIL/uL   Hemoglobin 8.6 (L) 12.0 - 15.0 g/dL   HCT 10/30/21 (L) 38.1 - 82.9 %   MCV 94.4 80.0 - 100.0 fL   MCH 32.1 26.0 - 34.0 pg   MCHC 34.0 30.0 - 36.0 g/dL   RDW 93.7 (H) 16.9 - 67.8 %   Platelets 157 150 - 400 K/uL   nRBC 0.8 (H) 0.0 - 0.2 %    Comment: Performed at [facility], [address], [place], [place]  Troponin I (High Sensitivity)     Status: Abnormal   Collection Time: 08/30/21  8:07 PM  Result Value Ref Range   Troponin I (High Sensitivity) 23 (H) <18 ng/L    Comment: (NOTE) Elevated high sensitivity troponin I (hsTnI) values and significant  changes across serial measurements may suggest ACS but many other  chronic and acute conditions are known to elevate hsTnI results.  Refer to the "Links" section for chest pain algorithms and additional  guidance. Performed at [facility], [address] [place] [address] [place], [place]   Ethanol     Status: Abnormal   Collection Time: 08/30/21  8:08 PM  Result Value Ref Range   Alcohol, Ethyl (B) 27 (H) <10 mg/dL    Comment: (NOTE) Lowest detectable limit for serum alcohol is 10 mg/dL.  For medical purposes only. Performed at [facility], [address] [place], [place]   Hepatic function panel     Status: Abnormal   Collection Time: 08/30/21  8:51 PM   Result Value Ref Range   Total Protein 5.7 (L) 6.5 - 8.1 g/dL   Albumin 2.0 (L) 3.5 - 5.0 g/dL   AST 10/30/21 (H) 15 - 41 U/L   ALT 48 (H) 0 - 44 U/L   Alkaline Phosphatase 278 (H) 38 - 126 U/L   Total Bilirubin 1.7 (H) 0.3 - 1.2 mg/dL   Bilirubin, Direct 0.7 (H) 0.0 -  0.2 mg/dL   Indirect Bilirubin 1.0 (H) 0.3 - 0.9 mg/dL    Comment: Performed at [facility], [address] [place], [place] [address]  Ammonia     Status: None   Collection Time: 08/30/21  8:51 PM  Result Value Ref Range   Ammonia 33 9 - 35 umol/L    Comment: Performed at [facility], [address] [place], [place] [address]  TSH     Status: None   Collection Time: 08/30/21  8:51 PM  Result Value Ref Range   TSH 3.569 0.350 - 4.500 uIU/mL    Comment: Performed by a 3rd Generation assay with a functional sensitivity of <=0.01 uIU/mL. Performed at [facility], [address] [place], [place] [address]   T4, free     Status: Abnormal   Collection Time: 08/30/21  8:51 PM  Result Value Ref Range   Free T4 1.33 (H) 0.61 - 1.12 ng/dL    Comment: (NOTE) Biotin ingestion may interfere with free T4 tests. If the results are inconsistent with the TSH level, previous test results, or the clinical presentation, then consider biotin interference. If needed, order repeat testing after stopping biotin. Performed at [facility], [address], [place], [place] [address]   Magnesium     Status: Abnormal   Collection Time: 08/30/21  8:51 PM  Result Value Ref Range   Magnesium 1.3 (L) 1.7 - 2.4 mg/dL    Comment: Performed at [facility], [address] [place], [place] [address]  Phosphorus     Status: Abnormal   Collection Time: 08/30/21  8:51 PM  Result Value Ref Range   Phosphorus 2.2 (L) 2.5 - 4.6 mg/dL    Comment: Performed at [facility], [address] [place], [place] [address]  Protime-INR     Status: None   Collection Time: 08/30/21  8:51 PM   Result Value Ref Range   Prothrombin Time 13.7 11.4 - 15.2 seconds   INR 1.1 0.8 - 1.2    Comment: (NOTE) INR goal varies based on device and disease states. Performed at [facility], [address] [place], [place] [address]   Vitamin B12     Status: Abnormal   Collection Time: 08/30/21  8:51 PM  Result Value Ref Range   Vitamin B-12 1,109 (H) 180 - 914 pg/mL    Comment: (NOTE) This assay is not validated for testing neonatal or myeloproliferative syndrome specimens for Vitamin B12 levels. Performed at [facility] [place] [facility], [address], [place], [place] [address]   Folate, serum, performed at [facility] lab     Status: Abnormal   Collection Time: 08/30/21  8:51 PM  Result Value Ref Range   Folate 5.2 (L) >5.9 ng/mL    Comment: Performed at [facility], [address] [place], [place] [address]  Troponin I (High Sensitivity)     Status: Abnormal   Collection Time: 08/30/21 10:50 PM  Result Value Ref Range   Troponin I (High Sensitivity) 23 (H) <18 ng/L    Comment: (NOTE) Elevated high sensitivity troponin I (hsTnI) values and significant  changes across serial measurements may suggest ACS but many other  chronic and acute conditions are known to elevate hsTnI results.  Refer to the "Links" section for chest pain algorithms and additional  guidance. Performed at [facility], [address] [place], [place] [address]   Resp Panel by RT-PCR (Flu A&B, Covid) Nasopharyngeal Swab  Status: None   Collection Time: 08/30/21 11:25 PM   Specimen: Nasopharyngeal Swab; Nasopharyngeal(NP) swabs in vial transport medium  Result Value Ref Range   SARS Coronavirus 2 by RT PCR NEGATIVE NEGATIVE    Comment: (NOTE) SARS-CoV-2 target nucleic acids are NOT DETECTED.  The SARS-CoV-2 RNA is generally detectable in upper respiratory specimens during the acute phase of infection. The lowest concentration of SARS-CoV-2 viral copies this assay can detect  is 138 copies/mL. A negative result does not preclude SARS-Cov-2 infection and should not be used as the sole basis for treatment or other patient management decisions. A negative result may occur with  improper specimen collection/handling, submission of specimen other than nasopharyngeal swab, presence of viral mutation(s) within the areas targeted by this assay, and inadequate number of viral copies(<138 copies/mL). A negative result must be combined with clinical observations, patient history, and epidemiological information. The expected result is Negative.  Fact Sheet for Patients:  BloggerCourse.com  Fact Sheet for Healthcare Providers:  SeriousBroker.it  This test is no t yet approved or cleared by the [place] FDA and  has been authorized for detection and/or diagnosis of SARS-CoV-2 by FDA under an Emergency Use Authorization (EUA). This EUA will remain  in effect (meaning this test can be used) for the duration of the COVID-19 declaration under Section 564(b)(1) of the Act, 21 U.S.C.section 360bbb-3(b)(1), unless the authorization is terminated  or revoked sooner.       Influenza A by PCR NEGATIVE NEGATIVE   Influenza B by PCR NEGATIVE NEGATIVE    Comment: (NOTE) The Xpert Xpress SARS-CoV-2/FLU/RSV plus assay is intended as an aid in the diagnosis of influenza from Nasopharyngeal swab specimens and should not be used as a sole basis for treatment. Nasal washings and aspirates are unacceptable for Xpert Xpress SARS-CoV-2/FLU/RSV testing.  Fact Sheet for Patients: BloggerCourse.com  Fact Sheet for Healthcare Providers: SeriousBroker.it  This test is not yet approved or cleared by the [place] FDA and has been authorized for detection and/or diagnosis of SARS-CoV-2 by FDA under an Emergency Use Authorization (EUA). This EUA will remain in effect (meaning this  test can be used) for the duration of the COVID-19 declaration under Section 564(b)(1) of the Act, 21 U.S.C. section 360bbb-3(b)(1), unless the authorization is terminated or revoked.  Performed at [facility], [address] [place], [place] [address]   CBC     Status: Abnormal   Collection Time: 08/31/21  6:21 AM  Result Value Ref Range   WBC 4.7 4.0 - 10.5 K/uL   RBC 2.20 (L) 3.87 - 5.11 MIL/uL   Hemoglobin 7.0 (L) 12.0 - 15.0 g/dL   HCT 53.6 (L) 64.4 - 03.4 %   MCV 93.2 80.0 - 100.0 fL   MCH 31.8 26.0 - 34.0 pg   MCHC 34.1 30.0 - 36.0 g/dL   RDW 74.2 (H) 59.5 - 63.8 %   Platelets 133 (L) 150 - 400 K/uL   nRBC 1.9 (H) 0.0 - 0.2 %    Comment: Performed at [facility], [address], [place], [place] [address]  Basic metabolic panel     Status: Abnormal   Collection Time: 08/31/21  6:21 AM  Result Value Ref Range   Sodium 137 135 - 145 mmol/L   Potassium 2.8 (L) 3.5 - 5.1 mmol/L   Chloride 102 98 - 111 mmol/L   CO2 28 22 - 32 mmol/L   Glucose, Bld 98 70 - 99 mg/dL    Comment: Glucose  reference range applies only to samples taken after fasting for at least 8 hours.   BUN 11 8 - 23 mg/dL   Creatinine, Ser 1.61 0.44 - 1.00 mg/dL   Calcium 6.9 (L) 8.9 - 10.3 mg/dL   GFR, Estimated >09 >60 mL/min    Comment: (NOTE) Calculated using the CKD-EPI Creatinine Equation (2021)    Anion gap 7 5 - 15    Comment: Performed at [facility], [address] [place], [place] [address]  Iron and TIBC     Status: None   Collection Time: 08/31/21  6:21 AM  Result Value Ref Range   Iron 74 28 - 170 ug/dL   TIBC NOT CALCULATED 811 - 450 ug/dL   Saturation Ratios NOT CALCULATED 10.4 - 31.8 %   UIBC NOT CALCULATED ug/dL    Comment: Performed at [facility], [address] [place], [place] [address]  Cortisol-am, blood     Status: None   Collection Time: 08/31/21  2:45 PM  Result Value Ref Range   Cortisol - AM 18.1 6.7 - 22.6 ug/dL    Comment:  Performed at [facility], [address], [place], [place] [address]  CBC with Differential/Platelet     Status: Abnormal   Collection Time: 09/01/21  5:04 AM  Result Value Ref Range   WBC 5.4 4.0 - 10.5 K/uL   RBC 2.44 (L) 3.87 - 5.11 MIL/uL   Hemoglobin 7.5 (L) 12.0 - 15.0 g/dL   HCT 13.0 (L) 86.5 - 78.4 %   MCV 92.6 80.0 - 100.0 fL   MCH 30.7 26.0 - 34.0 pg   MCHC 33.2 30.0 - 36.0 g/dL   RDW 69.6 (H) 29.5 - 28.4 %   Platelets 156 150 - 400 K/uL   nRBC 2.2 (H) 0.0 - 0.2 %   Neutrophils Relative % 51 %   Neutro Abs 2.8 1.7 - 7.7 K/uL   Lymphocytes Relative 31 %   Lymphs Abs 1.7 0.7 - 4.0 K/uL   Monocytes Relative 16 %   Monocytes Absolute 0.8 0.1 - 1.0 K/uL   Eosinophils Relative 1 %   Eosinophils Absolute 0.0 0.0 - 0.5 K/uL   Basophils Relative 0 %   Basophils Absolute 0.0 0.0 - 0.1 K/uL   Immature Granulocytes 1 %   Abs Immature Granulocytes 0.03 0.00 - 0.07 K/uL    Comment: Performed at [facility], [address] [place], [place] [address]  Comprehensive metabolic panel     Status: Abnormal   Collection Time: 09/01/21  5:04 AM  Result Value Ref Range   Sodium 135 135 - 145 mmol/L   Potassium 3.6 3.5 - 5.1 mmol/L   Chloride 101 98 - 111 mmol/L   CO2 27 22 - 32 mmol/L   Glucose, Bld 104 (H) 70 - 99 mg/dL    Comment: Glucose reference range applies only to samples taken after fasting for at least 8 hours.   BUN 10 8 - 23 mg/dL   Creatinine, Ser 0.10 0.44 - 1.00 mg/dL   Calcium 7.5 (L) 8.9 - 10.3 mg/dL   Total Protein 4.9 (L) 6.5 - 8.1 g/dL   Albumin 1.7 (L) 3.5 - 5.0 g/dL   AST 97 (H) 15 - 41 U/L   ALT 39 0 - 44 U/L   Alkaline Phosphatase 233 (H) 38 - 126 U/L   Total Bilirubin 1.1 0.3 - 1.2 mg/dL   GFR, Estimated >27 >25 mL/min    Comment: (NOTE) Calculated  using the CKD-EPI Creatinine Equation (2021)    Anion gap 7 5 - 15    Comment: Performed at [facility], [address] [place], [place] [address]  Magnesium     Status: None    Collection Time: 09/01/21  5:04 AM  Result Value Ref Range   Magnesium 2.3 1.7 - 2.4 mg/dL    Comment: Performed at [facility], [address] [place], [place] [address]  Phosphorus     Status: Abnormal   Collection Time: 09/01/21  5:04 AM  Result Value Ref Range   Phosphorus 1.9 (L) 2.5 - 4.6 mg/dL    Comment: Performed at [facility], [address] [place], [place] [address]    Current Facility-Administered Medications  Medication Dose Route Frequency Provider Last Rate Last Admin   0.9 % NaCl with KCl 20 mEq/ L  infusion   Intravenous Continuous Lurene Shadow, MD       alum & mag hydroxide-simeth (MAALOX/MYLANTA) 200-200-20 MG/5ML suspension 30 mL  30 mL Oral Q4H PRN Marrion Coy, MD   30 mL at 08/31/21 2139   enoxaparin (LOVENOX) injection 40 mg  40 mg Subcutaneous Q24H Tu, Ching T, DO   40 mg at 08/31/21 2342   folic acid (FOLVITE) tablet 1 mg  1 mg Oral Daily Tu, Ching T, DO   1 mg at 09/01/21 1041   hydrocortisone cream 1 %   Topical PRN Tu, Ching T, DO       LORazepam (ATIVAN) tablet 1-4 mg  1-4 mg Oral Q1H PRN Tu, Ching T, DO       Or   LORazepam (ATIVAN) injection 1-4 mg  1-4 mg Intravenous Q1H PRN Tu, Ching T, DO       mirtazapine (REMERON) tablet 15 mg  15 mg Oral QHS Lalani Winkles, Jackquline Denmark, MD       multivitamin with minerals tablet 1 tablet  1 tablet Oral Daily Tu, Ching T, DO   1 tablet at 09/01/21 1041   phosphorus (K PHOS NEUTRAL) tablet 500 mg  500 mg Oral BID Lurene Shadow, MD       polyethylene glycol (MIRALAX / GLYCOLAX) packet 17 g  17 g Oral Daily Lurene Shadow, MD   17 g at 09/01/21 1128   thiamine tablet 100 mg  100 mg Oral Daily Tu, Ching T, DO   100 mg at 09/01/21 1041   Or   thiamine (B-1) injection 100 mg  100 mg Intravenous Daily Tu, Ching T, DO       Zinc Oxide (TRIPLE PASTE) 12.8 % ointment   Topical PRN Tu, Ching T, DO        Musculoskeletal: Strength & Muscle Tone: decreased Gait & Station: unsteady Patient leans:  N/A            Psychiatric Specialty Exam:  Presentation  General Appearance: Appropriate for Environment  Eye Contact:Good  Speech:Clear and Coherent  Speech Volume:Normal  Handedness:Right   Mood and Affect  Mood:Depressed  Affect:Congruent; Blunt; Depressed   Thought Process  Thought Processes:Coherent  Descriptions of Associations:Intact  Orientation:Full (Time, Place and Person)  Thought Content:Logical  History of Schizophrenia/Schizoaffective disorder:No  Duration of Psychotic Symptoms:No data recorded Hallucinations:No data recorded Ideas of Reference:None  Suicidal Thoughts:No data recorded Homicidal Thoughts:No data recorded  Sensorium  Memory:Immediate Good; Recent Good; Remote Good  Judgment:Poor  Insight:Poor   Executive Functions  Concentration:Fair  Attention Span:Fair  Recall:Fair  Fund of Knowledge:Fair  Language:Fair   Psychomotor Activity  Psychomotor Activity: No data recorded  Assets  Assets:Communication Skills; Desire for Improvement; Physical Health; Resilience; Social Support   Sleep  Sleep: No data recorded  Physical Exam: Physical Exam Vitals and nursing note reviewed.  Constitutional:      Appearance: Normal appearance.  HENT:     Head: Normocephalic and atraumatic.     Mouth/Throat:     Pharynx: Oropharynx is clear.  Eyes:     Pupils: Pupils are equal, round, and reactive to light.  Cardiovascular:     Rate and Rhythm: Normal rate and regular rhythm.  Pulmonary:     Effort: Pulmonary effort is normal.     Breath sounds: Normal breath sounds.  Abdominal:     General: Abdomen is flat.     Palpations: Abdomen is soft.  Musculoskeletal:        General: Normal range of motion.  Skin:    General: Skin is warm and dry.  Neurological:     General: No focal deficit present.     Mental Status: She is alert. Mental status is at baseline.  Psychiatric:        Attention and Perception:  Attention normal.        Mood and Affect: Mood normal. Affect is blunt.        Speech: Speech normal.        Behavior: Behavior is cooperative.        Thought Content: Thought content normal.   Review of Systems  Constitutional: Negative.   HENT: Negative.    Eyes: Negative.   Respiratory: Negative.    Cardiovascular: Negative.   Gastrointestinal: Negative.   Musculoskeletal: Negative.   Skin: Negative.   Neurological: Negative.   Psychiatric/Behavioral:  Positive for depression. Negative for hallucinations, substance abuse and suicidal ideas. The patient is not nervous/anxious.   Blood pressure 98/73, pulse 96, temperature 97.6 F (36.4 C), temperature source Oral, resp. rate 20, height 5\' 6"  (1.676 m), weight 62.1 kg, last menstrual period 05/11/2010, SpO2 100 %. Body mass index is 22.11 kg/m.  Treatment Plan Summary: Plan as suggested yesterday I am starting mirtazapine 15 mg at night.  Patient does not seem to be having any alcohol withdrawal symptoms no sign of delirium.  Mood improved.  Tomorrow we can probably get rid of the sitter in the IVC.  Disposition: No evidence of imminent risk to self or others at present.   Patient does not meet criteria for psychiatric inpatient admission. Supportive therapy provided about ongoing stressors.  05/13/2010, MD 09/01/2021 6:27 PM

## 2021-09-01 NOTE — Consult Note (Signed)
WOC Nurse Consult Note: Patient receiving care in [facility]. Patient able to turn self for buttock evaluation. 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 It is on the fleshy part of the buttock. Pressure Injury POA: Yes Measurement: Entire DTPI area measures 9 cm x 9 cm Wound bed: Approximately half of the wound has lost the overlying skin layer and is pink and moist.  The other half is greyish in color, has "wrinkled/puckered" overlying skin that will eventually peel off. Drainage (amount, consistency, odor) none Periwound: intact Dressing procedure/placement/frequency:  Place a sacral foam dressing over the right buttock wound. IF THE OVERLYING skin begins to come off, place Xeroform over the wound, then the sacral foam dressing. Place the dressing sideways with the "tip" of the dressing towards the hip area.  Change the foam every 3 days. If Xeroform is over the wound, change the Xeroform daily, and the foam every 3 days and prn.  Monitor the wound area(s) for worsening of condition such as: Signs/symptoms of infection,  Increase in size,  Development of or worsening of odor, Development of pain, or increased pain at the affected locations.  Notify the medical team if any of these develop.  Thank you for the consult.  Discussed plan of care with the patient and bedside nurse.  WOC nurse will not follow at this time.  Please re-consult the WOC team if needed.  Helmut Muster, RN, MSN, CWOCN, CNS-BC, pager [phone]

## 2021-09-01 NOTE — Evaluation (Signed)
Occupational Therapy Evaluation Patient Details Name: Jamie Wilkins MRN: [id number] DOB: 1961-09-01 Today's Date: 09/01/2021   History of Present Illness 60 y.o. female who presents to ED following a DSS welfare check; pt and her neighbor called for Meals on Wheels and EMS ultimately brought pt to hospital. Medical history significant for paroxysmal atrial fibrillation not on anticoagulation due to history of GI bleed, alcoholic cirrhosis, anemia.   Clinical Impression   Patient presenting with decreased Ind in self care, balance, functional mobility/transfers, endurance, and safety awareness. Patient reports living at home with intermittent assistance from neighbors for self care needs and meals. However, pt reports she has not ambulated in 2-3 months and most recently confined to lift chair. Pt needing mod A for bed mobility and was able to tolerate seated EOB for grooming tasks with close supervision for balance. Pt declined standing secondary to B LE pain. HR increased to 130's while seated EOB with grooming tasks. Pt reports some feelings of anxiety and RN notified. Pt likely needs total A for LB self care at this time. Patient will benefit from acute OT to increase overall independence in the areas of ADLs, functional mobility, and safety awareness in order to safely discharge to next venue of care.      Recommendations for follow up therapy are one component of a multi-disciplinary discharge planning process, led by the attending physician.  Recommendations may be updated based on patient status, additional functional criteria and insurance authorization.   Follow Up Recommendations  SNF;Supervision/Assistance - 24 hour    Equipment Recommendations  Other (comment) (defer to next venue of care)       Precautions / Restrictions Precautions Precautions: Fall Precaution Comments: monitor HR Restrictions Weight Bearing Restrictions: No      Mobility Bed Mobility Overal bed  mobility: Needs Assistance Bed Mobility: Supine to Sit;Sit to Supine;Rolling Rolling: Min assist   Supine to sit: HOB elevated;Mod assist Sit to supine: Mod assist   General bed mobility comments: Pt needing min cuing for hand placement and technique with assist for LEs and trunk support    Transfers                 General transfer comment: Pt declined secondary to LE pain but was agreeble to attempt later with pain medication    Balance Overall balance assessment: Needs assistance Sitting-balance support: Single extremity supported;Feet supported Sitting balance-Leahy Scale: Fair Sitting balance - Comments: supervision for safety on EOB       Standing balance comment: standing deferred                           ADL either performed or assessed with clinical judgement   ADL Overall ADL's : Needs assistance/impaired     Grooming: Oral care;Sitting;Set up Grooming Details (indicate cue type and reason): with supervision for balance                 Toilet Transfer: Total assistance Toilet Transfer Details (indicate cue type and reason): simulated           General ADL Comments: Pt declined standing and transfers this session secondary to B LEs pain.     Vision Baseline Vision/History: 1 Wears glasses              Pertinent Vitals/Pain Pain Assessment: 0-10 Pain Score: 5  Pain Location: B LEs Pain Descriptors / Indicators: Aching;Discomfort Pain Intervention(s): Limited activity within patient's tolerance;Monitored during session;Repositioned  Extremity/Trunk Assessment Upper Extremity Assessment Upper Extremity Assessment: Generalized weakness   Lower Extremity Assessment Lower Extremity Assessment: Generalized weakness       Communication Communication Communication: No difficulties   Cognition Arousal/Alertness: Awake/alert Behavior During Therapy: WFL for tasks assessed/performed Overall Cognitive Status: Within  Functional Limits for tasks assessed                                 General Comments: pt with slow processing with routine tasks              Home Living Family/patient expects to be discharged to:: Private residence Living Arrangements: Alone Available Help at Discharge: Friend(s);Neighbor;Available PRN/intermittently Type of Home: House Home Access: Stairs to enter [employer] of Steps: 5 Entrance Stairs-Rails: Can reach both Home Layout: One level               Home Equipment: Walker - 4 wheels;Walker - 2 wheels;Bedside commode   Additional Comments: Has not been able to walk or navigate steps to enter home in 2-3 months. When she was walking she used a Rollator. Pt "lost" w/c when car was repo-ed.      Prior Functioning/Environment Level of Independence: Needs assistance  Gait / Transfers Assistance Needed: Has not ambulated in 2-3 months ADL's / Homemaking Assistance Needed: Pt reports neighbors assisted with dressing tasks and bringing meals except on sundays. Pt sleeps in lift chair   Comments: neighbors available as needed and stop by throughout the day per pt.        OT Problem List: Decreased strength;Decreased activity tolerance;Impaired balance (sitting and/or standing);Decreased safety awareness;Decreased knowledge of use of DME or AE;Pain      OT Treatment/Interventions: Self-care/ADL training;Therapeutic exercise;Therapeutic activities;Neuromuscular education;Energy conservation;DME and/or AE instruction;Patient/family education;Balance training;Manual therapy    OT Goals(Current goals can be found in the care plan section) Acute Rehab OT Goals Patient Stated Goal: none stated OT Goal Formulation: With patient Time For Goal Achievement: 09/15/21 Potential to Achieve Goals: Good ADL Goals Pt Will Perform Grooming: standing;with min assist Pt Will Perform Lower Body Dressing: with min assist;sit to/from stand Pt Will Transfer  to Toilet: with min assist;ambulating Pt Will Perform Toileting - Clothing Manipulation and hygiene: with min assist;sit to/from stand  OT Frequency: Min 2X/week   Barriers to D/C: Decreased caregiver support  Pt lives alone and does have some assist but really needs 24/7 assist at this time and requiring higher level of care          AM-PAC OT "6 Clicks" Daily Activity     Outcome Measure Help from another person eating meals?: None Help from another person taking care of personal grooming?: A Little Help from another person toileting, which includes using toliet, bedpan, or urinal?: Total Help from another person bathing (including washing, rinsing, drying)?: A Lot Help from another person to put on and taking off regular upper body clothing?: A Little Help from another person to put on and taking off regular lower body clothing?: Total 6 Click Score: 14   End of Session Nurse Communication: Mobility status;Other (comment) (elevated HR)  Activity Tolerance: Patient limited by pain;Patient limited by fatigue;Other (comment) (elevated HR to 130's on EOB) Patient left: in bed;with call bell/phone within reach;with nursing/sitter in room  OT Visit Diagnosis: Unsteadiness on feet (R26.81);Muscle weakness (generalized) (M62.81)                Time: 5284-1324 OT Time  Calculation (min): 29 min Charges:  OT General Charges $OT Visit: 1 Visit OT Evaluation $OT Eval Moderate Complexity: 1 Mod OT Treatments $Self Care/Home Management : 8-22 mins  Jackquline Denmark, MS, OTR/L , CBIS ascom [phone]  09/01/21, 11:56 AM

## 2021-09-01 NOTE — Progress Notes (Signed)
Physical Therapy Treatment Patient Details Name: Jamie Wilkins MRN: [id number] DOB: 1961/01/04 Today's Date: 09/01/2021   History of Present Illness 60 y.o. female who presents to ED following a DSS welfare check; pt and her neighbor called for Meals on Wheels and EMS ultimately brought pt to hospital. Medical history significant for paroxysmal atrial fibrillation not on anticoagulation due to history of GI bleed, alcoholic cirrhosis, anemia.    PT Comments    Pt is pleasant but hesitant to do a lot as she is feeling generally very weak.  She did need assist with getting to sitting (and even more so standing) but showed good effort t/o the session despite some hesitancy and fear of falling.  She managed to do 2 small bouts of assisted side stepping along EOB (which is apparently more than she has done in months) but was very guarded, quick to fatigue.  She did have difficulty fully getting weight forward on the walker but with plenty of encouragement and reassurance that PT would stay close and "not let me fall" she did some 'prolonged' (~1 minute on longer bout) standing w/o needing direct assist.   Recommendations for follow up therapy are one component of a multi-disciplinary discharge planning process, led by the attending physician.  Recommendations may be updated based on patient status, additional functional criteria and insurance authorization.  Follow Up Recommendations  SNF;Supervision/Assistance - 24 hour     Equipment Recommendations   (TBD at rehab, but likely needs a w/c and walker)    Recommendations for Other Services       Precautions / Restrictions Precautions Precautions: Fall Precaution Comments: monitor HR Restrictions Weight Bearing Restrictions: No     Mobility  Bed Mobility Overal bed mobility: Needs Assistance Bed Mobility: Supine to Sit;Sit to Supine     Supine to sit: Mod assist Sit to supine: Mod assist   General bed mobility comments: Pt  shwoed some effort with ability to get to side and one leg off bed, heavy UE assist with good effort but obviously unable to attain sitting w/o direct assist from PT    Transfers Overall transfer level: Needs assistance Equipment used: Rolling walker (2 wheeled) Transfers: Sit to/from Stand Sit to Stand: Mod assist         General transfer comment: 2 standing efforts from elevated bed and with plenty of cuing for set up and sequencing.  Unable to lift hips even 1" off bed w/o assist but once PT helped her to get some momentum she was able to assist with transition to standing  Ambulation/Gait             General Gait Details: No true ambulation:  On first standing effort she managed just 2 very small side steps along EOB.  After seated rest break she was able to again take a few very effortful, guarded side steps along EOB, doing better this time actaully managing about 1 foot of R lateral shuffle steps   Stairs             Wheelchair Mobility    Modified Rankin (Stroke Patients Only)       Balance Overall balance assessment: Needs assistance Sitting-balance support: Bilateral upper extremity supported Sitting balance-Leahy Scale: Fair     Standing balance support: Bilateral upper extremity supported Standing balance-Leahy Scale: Poor Standing balance comment: pt very anxious about standing, did have some buckling and needed close guarding and occasional assist to maintain standing  Cognition Arousal/Alertness: Awake/alert Behavior During Therapy: WFL for tasks assessed/performed Overall Cognitive Status: Within Functional Limits for tasks assessed                                        Exercises General Exercises - Lower Extremity Ankle Circles/Pumps: AROM;10 reps;Both Quad Sets: Strengthening;10 reps;Both Heel Slides: AROM;5 reps;Both Hip ABduction/ADduction: AROM;Both;10 reps    General Comments  General comments (skin integrity, edema, etc.): Pt's HR up to 120s with sitting EOB, back down to ~100 then 140s on both standing efforts.      Pertinent Vitals/Pain Pain Assessment:  (reports general soreness, no focal pain)    Home Living                      Prior Function            PT Goals (current goals can now be found in the care plan section) Progress towards PT goals: Progressing toward goals    Frequency    Min 2X/week      PT Plan Current plan remains appropriate    Co-evaluation              AM-PAC PT "6 Clicks" Mobility   Outcome Measure  Help needed turning from your back to your side while in a flat bed without using bedrails?: A Little Help needed moving from lying on your back to sitting on the side of a flat bed without using bedrails?: A Lot Help needed moving to and from a bed to a chair (including a wheelchair)?: A Lot Help needed standing up from a chair using your arms (e.g., wheelchair or bedside chair)?: A Lot Help needed to walk in hospital room?: Total Help needed climbing 3-5 steps with a railing? : Total 6 Click Score: 11    End of Session Equipment Utilized During Treatment: Gait belt Activity Tolerance: Patient limited by fatigue Patient left: in bed;with call bell/phone within reach;with bed alarm set Nurse Communication: Mobility status PT Visit Diagnosis: Muscle weakness (generalized) (M62.81);Adult, failure to thrive (R62.7)     Time: 6962-9528 PT Time Calculation (min) (ACUTE ONLY): 33 min  Charges:  $Therapeutic Exercise: 8-22 mins $Therapeutic Activity: 8-22 mins                     Malachi Pro, DPT 09/01/2021, 4:07 PM

## 2021-09-01 NOTE — Progress Notes (Addendum)
Progress Note    Jamie Wilkins  CLE:[id number] DOB: Sep 14, 1961  DOA: 08/30/2021 PCP: [facility]      Brief Narrative:    Medical records reviewed and are as summarized below:  Jamie Wilkins is a 60 y.o. female with medical history significant for paroxysmal atrial fibrillation not on anticoagulation due to history of GI bleed, alcoholic liver cirrhosis, chronic anemia.  She was brought to the hospital at the recommendation of DSS after she was found sitting at home in her feces.  She complained of generalized weakness and poor oral intake.  She felt so weak that she was unable to get up from her chair.       Assessment/Plan:   Principal Problem:   Severe major depression, single episode, without psychotic features (HCC) Active Problems:   Hypocalcemia   Hypokalemia   Hypomagnesemia   Alcoholic hepatitis without ascites   AF (paroxysmal atrial fibrillation) (HCC)   Hypotension   Depression due to physical illness   Decubitus ulcer   Body mass index is 22.11 kg/m.   Hypotension, dehydration: BP still low but stable.  Restart IV fluids.  Continue to monitor.  Hypokalemia, hypomagnesemia, hypocalcemia and hypophosphatemia: Check vitamin D level.  Start oral potassium phosphate for hypophosphatemia.  Paroxysmal atrial fibrillation with RVR: Heart rate mainly between 100-115.  Heart rate appears to be stable.  Rate control drugs have not been started because of hypotension.  Depression with suicidal ideation: Patient said she is no longer suicidal.  Continue mirtazapine.  She still has a one-to-one watch for safety.  Follow-up with psychiatrist for further recommendations.  Alcohol use disorder: Counseled to quit using alcohol.  Continue thiamine and multivitamins.  She is also on CIWA protocol.  Anemia of chronic disease, folic acid deficiency: No indication for blood transfusion at this time.  Continue folic acid.  Alcoholic liver  cirrhosis: Compensated  Right buttock deep tissue injury (present on admission): Continue local wound care.   Diet Order             Diet regular Room service appropriate? Yes; Fluid consistency: Thin  Diet effective now                      Consultants: Psychiatrist  Procedures: None    Medications:    enoxaparin (LOVENOX) injection  40 mg Subcutaneous Q24H   folic acid  1 mg Oral Daily   mirtazapine  15 mg Oral QHS   multivitamin with minerals  1 tablet Oral Daily   phosphorus  500 mg Oral BID   polyethylene glycol  17 g Oral Daily   thiamine  100 mg Oral Daily   Or   thiamine  100 mg Intravenous Daily   Continuous Infusions:  0.9 % NaCl with KCl 20 mEq / L       Anti-infectives (From admission, onward)    None              Family Communication/Anticipated D/C date and plan/Code Status   DVT prophylaxis: enoxaparin (LOVENOX) injection 40 mg Start: 08/30/21 2330     Code Status: Full Code  Family Communication: None Disposition Plan:    Status is: Inpatient  Remains inpatient appropriate because:Unsafe d/c plan and Inpatient level of care appropriate due to severity of illness  Dispo: The patient is from: Home              Anticipated d/c is to: SNF  Patient currently is not medically stable to d/c.   Difficult to place patient No           Subjective:   Interval events noted.  C/o constipation and upper abdominal pain.  She has a Comptroller at the bedside.  Objective:    Vitals:   09/01/21 0441 09/01/21 0447 09/01/21 0726 09/01/21 1200  BP: (!) 85/66 (!) 87/62 97/68 98/73   Pulse: (!) 103 99 (!) 104 96  Resp: 18 18 20    Temp: 98 F (36.7 C)  98 F (36.7 C) 97.6 F (36.4 C)  TempSrc:   Oral Oral  SpO2: 98%  96% 100%  Weight:      Height:       No data found.   Intake/Output Summary (Last 24 hours) at 09/01/2021 1730 Last data filed at 08/31/2021 1830 Gross per 24 hour  Intake 0 ml  Output --   Net 0 ml   Filed Weights   08/30/21 1936  Weight: 62.1 kg    Exam:  GEN: NAD SKIN: Warm and dry.  Deep tissue injury on right buttock. EYES: EOMI ENT: MMM CV: RRR PULM: CTA B ABD: soft, ND, NT, +BS CNS: AAO x 3, non focal EXT: No edema or tenderness    Pressure Injury 08/31/21 Buttocks Right Deep Tissue Pressure Injury - Purple or maroon localized area of discolored intact skin or blood-filled blister due to damage of underlying soft tissue from pressure and/or shear. (Active)  08/31/21 1800  Location: Buttocks  Location Orientation: Right  Staging: Deep Tissue Pressure Injury - Purple or maroon localized area of discolored intact skin or blood-filled blister due to damage of underlying soft tissue from pressure and/or shear.  Wound Description (Comments):   Present on Admission: Yes     Data Reviewed:   I have personally reviewed following labs and imaging studies:  Labs: Labs show the following:   Basic Metabolic Panel: Recent Labs  Lab 08/30/21 2007 08/30/21 2051 08/31/21 0621 09/01/21 0504  NA 135  --  137 135  K 2.5*  --  2.8* 3.6  CL 94*  --  102 101  CO2 27  --  28 27  GLUCOSE 85  --  98 104*  BUN 11  --  11 10  CREATININE 0.77  --  0.72 0.68  CALCIUM 7.7*  --  6.9* 7.5*  MG  --  1.3*  --  2.3  PHOS  --  2.2*  --  1.9*   GFR Estimated Creatinine Clearance: 69.1 mL/min (by C-G formula based on SCr of 0.68 mg/dL). Liver Function Tests: Recent Labs  Lab 08/30/21 2051 09/01/21 0504  AST 145* 97*  ALT 48* 39  ALKPHOS 278* 233*  BILITOT 1.7* 1.1  PROT 5.7* 4.9*  ALBUMIN 2.0* 1.7*   No results for input(s): LIPASE, AMYLASE in the last 168 hours. Recent Labs  Lab 08/30/21 2051  AMMONIA 33   Coagulation profile Recent Labs  Lab 08/30/21 2051  INR 1.1    CBC: Recent Labs  Lab 08/30/21 2007 08/31/21 0621 09/01/21 0504  WBC 6.3 4.7 5.4  NEUTROABS  --   --  2.8  HGB 8.6* 7.0* 7.5*  HCT 25.3* 20.5* 22.6*  MCV 94.4 93.2 92.6  PLT  157 133* 156   Cardiac Enzymes: No results for input(s): CKTOTAL, CKMB, CKMBINDEX, TROPONINI in the last 168 hours. BNP (last 3 results) No results for input(s): PROBNP in the last 8760 hours. CBG: No results for input(s): GLUCAP in  the last 168 hours. D-Dimer: No results for input(s): DDIMER in the last 72 hours. Hgb A1c: No results for input(s): HGBA1C in the last 72 hours. Lipid Profile: No results for input(s): CHOL, HDL, LDLCALC, TRIG, CHOLHDL, LDLDIRECT in the last 72 hours. Thyroid function studies: Recent Labs    08/30/21 March 15, 2050  TSH 3.569   Anemia work up: Recent Labs    08/30/21 03-15-2050 08/31/21 0621  VITAMINB12 1,109*  --   FOLATE 5.2*  --   TIBC  --  NOT CALCULATED  IRON  --  74   Sepsis Labs: Recent Labs  Lab 08/30/21 2006-03-15 08/31/21 0621 09/01/21 0504  WBC 6.3 4.7 5.4    Microbiology Recent Results (from the past 240 hour(s))  Resp Panel by RT-PCR (Flu A&B, Covid) Nasopharyngeal Swab     Status: None   Collection Time: 08/30/21 11:25 PM   Specimen: Nasopharyngeal Swab; Nasopharyngeal(NP) swabs in vial transport medium  Result Value Ref Range Status   SARS Coronavirus 2 by RT PCR NEGATIVE NEGATIVE Final    Comment: (NOTE) SARS-CoV-2 target nucleic acids are NOT DETECTED.  The SARS-CoV-2 RNA is generally detectable in upper respiratory specimens during the acute phase of infection. The lowest concentration of SARS-CoV-2 viral copies this assay can detect is 138 copies/mL. A negative result does not preclude SARS-Cov-2 infection and should not be used as the sole basis for treatment or other patient management decisions. A negative result may occur with  improper specimen collection/handling, submission of specimen other than nasopharyngeal swab, presence of viral mutation(s) within the areas targeted by this assay, and inadequate number of viral copies(<138 copies/mL). A negative result must be combined with clinical observations, patient history, and  epidemiological information. The expected result is Negative.  Fact Sheet for Patients:  BloggerCourse.com  Fact Sheet for Healthcare Providers:  SeriousBroker.it  This test is no t yet approved or cleared by the [place] FDA and  has been authorized for detection and/or diagnosis of SARS-CoV-2 by FDA under an Emergency Use Authorization (EUA). This EUA will remain  in effect (meaning this test can be used) for the duration of the COVID-19 declaration under Section 564(b)(1) of the Act, 21 U.S.C.section 360bbb-3(b)(1), unless the authorization is terminated  or revoked sooner.       Influenza A by PCR NEGATIVE NEGATIVE Final   Influenza B by PCR NEGATIVE NEGATIVE Final    Comment: (NOTE) The Xpert Xpress SARS-CoV-2/FLU/RSV plus assay is intended as an aid in the diagnosis of influenza from Nasopharyngeal swab specimens and should not be used as a sole basis for treatment. Nasal washings and aspirates are unacceptable for Xpert Xpress SARS-CoV-2/FLU/RSV testing.  Fact Sheet for Patients: BloggerCourse.com  Fact Sheet for Healthcare Providers: SeriousBroker.it  This test is not yet approved or cleared by the [place] FDA and has been authorized for detection and/or diagnosis of SARS-CoV-2 by FDA under an Emergency Use Authorization (EUA). This EUA will remain in effect (meaning this test can be used) for the duration of the COVID-19 declaration under Section 564(b)(1) of the Act, 21 U.S.C. section 360bbb-3(b)(1), unless the authorization is terminated or revoked.  Performed at [facility], [address], [place], [place] [address]     Procedures and diagnostic studies:  DG Chest Portable 1 View  Result Date: 08/30/2021 CLINICAL DATA:  Weakness. EXAM: PORTABLE CHEST 1 VIEW COMPARISON:  Chest x-ray 07/20/2021, CT chest 07/20/2021, chest x-ray  06/09/2021 FINDINGS: The heart and mediastinal contours are within normal limits. No focal  consolidation. No pulmonary edema. Blunting of the right costophrenic angle. No left pleural effusion. No pneumothorax. Vertical linear density overlying the right lung consistent with skin folds. No acute osseous abnormality. IMPRESSION: Blunting of the right costophrenic angle with likely trace pleural effusion. Electronically Signed   By: Tish Frederickson M.D.   On: 08/30/2021 21:04               LOS: 1 day   Lorilei Horan  [facility]   Pager on [email or web address]. If 7PM-7AM, please contact night-coverage at [email or web address]     09/01/2021, 5:31 PM

## 2021-09-02 LAB — CBC WITH DIFFERENTIAL/PLATELET
Abs Immature Granulocytes: 0.02 10*3/uL (ref 0.00–0.07)
Basophils Absolute: 0 10*3/uL (ref 0.0–0.1)
Basophils Relative: 0 %
Eosinophils Absolute: 0 10*3/uL (ref 0.0–0.5)
Eosinophils Relative: 1 %
HCT: 24.3 % — ABNORMAL LOW (ref 36.0–46.0)
Hemoglobin: 7.7 g/dL — ABNORMAL LOW (ref 12.0–15.0)
Immature Granulocytes: 0 %
Lymphocytes Relative: 41 %
Lymphs Abs: 1.9 10*3/uL (ref 0.7–4.0)
MCH: 30.4 pg (ref 26.0–34.0)
MCHC: 31.7 g/dL (ref 30.0–36.0)
MCV: 96 fL (ref 80.0–100.0)
Monocytes Absolute: 0.9 10*3/uL (ref 0.1–1.0)
Monocytes Relative: 19 %
Neutro Abs: 1.8 10*3/uL (ref 1.7–7.7)
Neutrophils Relative %: 39 %
Platelets: 170 10*3/uL (ref 150–400)
RBC: 2.53 MIL/uL — ABNORMAL LOW (ref 3.87–5.11)
RDW: 18.6 % — ABNORMAL HIGH (ref 11.5–15.5)
WBC: 4.6 10*3/uL (ref 4.0–10.5)
nRBC: 0.9 % — ABNORMAL HIGH (ref 0.0–0.2)

## 2021-09-02 LAB — BASIC METABOLIC PANEL
Anion gap: 5 (ref 5–15)
BUN: 8 mg/dL (ref 8–23)
CO2: 28 mmol/L (ref 22–32)
Calcium: 7.8 mg/dL — ABNORMAL LOW (ref 8.9–10.3)
Chloride: 104 mmol/L (ref 98–111)
Creatinine, Ser: 0.67 mg/dL (ref 0.44–1.00)
GFR, Estimated: >60 mL/min (ref >60)
Glucose, Bld: 81 mg/dL (ref 70–99)
Potassium: 3.6 mmol/L (ref 3.5–5.1)
Sodium: 137 mmol/L (ref 135–145)

## 2021-09-02 LAB — PHOSPHORUS: Phosphorus: 2.6 mg/dL (ref 2.5–4.6)

## 2021-09-02 LAB — GLUCOSE, CAPILLARY: Glucose-Capillary: 106 mg/dL — ABNORMAL HIGH (ref 70–99)

## 2021-09-02 LAB — VITAMIN D 25 HYDROXY (VIT D DEFICIENCY, FRACTURES): Vit D, 25-Hydroxy: 20.3 ng/mL — ABNORMAL LOW (ref 30–100)

## 2021-09-02 LAB — MAGNESIUM: Magnesium: 2.1 mg/dL (ref 1.7–2.4)

## 2021-09-02 MED ORDER — VITAMIN D 25 MCG (1000 UNIT) PO TABS
1000.0000 [IU] | ORAL_TABLET | Freq: Every day | ORAL | Status: DC
  Administered 2021-09-02 – 2021-09-16 (×7): 1000 [IU] via ORAL
  Filled 2021-09-02 (×9): qty 1

## 2021-09-02 MED ORDER — MIRTAZAPINE 15 MG PO TABS
30.0000 mg | ORAL_TABLET | Freq: Every day | ORAL | Status: DC
  Administered 2021-09-02 – 2021-09-20 (×13): 30 mg via ORAL
  Filled 2021-09-02 (×16): qty 2

## 2021-09-02 MED ORDER — MIDODRINE HCL 5 MG PO TABS
5.0000 mg | ORAL_TABLET | Freq: Three times a day (TID) | ORAL | Status: DC
  Administered 2021-09-02 – 2021-09-17 (×44): 5 mg via ORAL
  Filled 2021-09-02 (×41): qty 1

## 2021-09-02 NOTE — Progress Notes (Signed)
   09/02/21 0425  Assess: MEWS Score  Temp 98.1 F (36.7 C)  BP 92/77  Pulse Rate (!) 111  Level of Consciousness Alert  SpO2 99 %  O2 Device Room Air  Assess: MEWS Score  MEWS Temp 0  MEWS Systolic 1  MEWS Pulse 2  MEWS RR 0  MEWS LOC 0  MEWS Score 3  MEWS Score Color Yellow  Assess: if the MEWS score is Yellow or Red  Were vital signs taken at a resting state? No  Focused Assessment No change from prior assessment  Does the patient meet 2 or more of the SIRS criteria? No  MEWS guidelines implemented *See Row Information* No, vital signs rechecked  Treat  Pain Scale 0-10  Pain Score 0  Assess: SIRS CRITERIA  SIRS Temperature  0  SIRS Pulse 1  SIRS Respirations  0  SIRS WBC 0  SIRS Score Sum  1    Pt with elevated heart rate upon being awoken, c/o "you scared me!" Heart rate was rechecked a few minutes later and had normalized. No significant change from previous assessment.

## 2021-09-02 NOTE — [place] FL2 (Signed)
MEDICAID FL2 LEVEL OF CARE SCREENING TOOL     IDENTIFICATION  Patient Name: Jamie Wilkins Birthdate: July 29, 1961 Sex: female Admission Date (Current Location): 08/30/2021  [facility] and [place] Number:  Chiropodist and Address:  [facility], [address], [place], [place] [address]      Provider Number: [id number]  Attending Physician Name and Address:  Lurene Shadow, MD  Relative Name and Phone Number:  Dan Humphreys (son) [phone]    Current Level of Care: Hospital Recommended Level of Care: Skilled Nursing Facility Prior Approval Number:    Date Approved/Denied:   PASRR Number:    Discharge Plan: SNF    Current Diagnoses: Patient Active Problem List   Diagnosis Date Noted   Depression due to physical illness 08/31/2021   Decubitus ulcer 08/31/2021   Severe major depression, single episode, without psychotic features (HCC) 08/31/2021   Hypotension 08/30/2021   PAC (premature atrial contraction)    Anemia    Atrial fibrillation with RVR (HCC) 07/20/2021   Colitis 06/12/2021   Melena    AF (paroxysmal atrial fibrillation) (HCC) 06/10/2021   Protein-calorie malnutrition, severe 06/10/2021   Hyponatremia 06/09/2021   Symptomatic anemia 06/09/2021   Malnutrition of moderate degree 02/17/2021   Syncope    Elevated lipase    Electrolyte abnormality    Chest Wilkins 02/15/2021   Hypocalcemia    Hypokalemia    Hypomagnesemia    Alcoholic hepatitis without ascites     Orientation RESPIRATION BLADDER Height & Weight     Self, Time, Situation, Place  Normal Incontinent, External catheter Weight: 137 lb (62.1 kg) Height:  5\' 6"  (167.6 cm)  BEHAVIORAL SYMPTOMS/MOOD NEUROLOGICAL BOWEL NUTRITION STATUS      Continent Diet (see discharge summary)  AMBULATORY STATUS COMMUNICATION OF NEEDS Skin   Extensive Assist Verbally Other (Comment) (right buttocks deep tissue pressure injury)                       Personal Care  Assistance Level of Assistance  Bathing, Feeding, Dressing, Total care Bathing Assistance: Maximum assistance Feeding assistance: Independent Dressing Assistance: Maximum assistance Total Care Assistance: Maximum assistance   Functional Limitations Info  Sight, Hearing, Speech Sight Info: Impaired Hearing Info: Adequate Speech Info: Adequate    SPECIAL CARE FACTORS FREQUENCY  PT (By licensed PT), OT (By licensed OT)     PT Frequency: min 4x weekly OT Frequency: min 4x weekly            Contractures Contractures Info: Not present    Additional Factors Info  Code Status, Allergies Code Status Info: full Allergies Info: No Known Allergies           Current Medications (09/02/2021):  This is the current hospital active medication list Current Facility-Administered Medications  Medication Dose Route Frequency Provider Last Rate Last Admin   0.9 % NaCl with KCl 20 mEq/ L  infusion   Intravenous Continuous 09/04/2021, MD 75 mL/hr at 09/01/21 2114 New Bag at 09/01/21 2114   alum & mag hydroxide-simeth (MAALOX/MYLANTA) 200-200-20 MG/5ML suspension 30 mL  30 mL Oral Q4H PRN 05-30-2001, MD   30 mL at 08/31/21 2139   enoxaparin (LOVENOX) injection 40 mg  40 mg Subcutaneous Q24H Tu, Ching T, DO   40 mg at 09/01/21 2303   folic acid (FOLVITE) tablet 1 mg  1 mg Oral Daily Tu, Ching T, DO   1 mg at 09/02/21 09/04/21   hydrocortisone cream 1 %  Topical PRN Tu, Ching T, DO       LORazepam (ATIVAN) tablet 1-4 mg  1-4 mg Oral Q1H PRN Tu, Ching T, DO       Or   LORazepam (ATIVAN) injection 1-4 mg  1-4 mg Intravenous Q1H PRN Tu, Ching T, DO       mirtazapine (REMERON) tablet 15 mg  15 mg Oral QHS Clapacs, John T, MD   15 mg at 09/01/21 2115   multivitamin with minerals tablet 1 tablet  1 tablet Oral Daily Tu, Ching T, DO   1 tablet at 09/02/21 1696   phosphorus (K PHOS NEUTRAL) tablet 500 mg  500 mg Oral BID Lurene Shadow, MD   500 mg at 09/02/21 7893   polyethylene glycol (MIRALAX /  GLYCOLAX) packet 17 g  17 g Oral Daily Lurene Shadow, MD   17 g at 09/01/21 1128   thiamine tablet 100 mg  100 mg Oral Daily Tu, Ching T, DO   100 mg at 09/02/21 8101   Or   thiamine (B-1) injection 100 mg  100 mg Intravenous Daily Tu, Ching T, DO       Zinc Oxide (TRIPLE PASTE) 12.8 % ointment   Topical PRN Tu, Ching T, DO         Discharge Medications: Please see discharge summary for a list of discharge medications.  Relevant Imaging Results:  Relevant Lab Results:   Additional Information SSN: [id number]  Gildardo Griffes, LCSW

## 2021-09-02 NOTE — TOC Initial Note (Signed)
Transition of Care [facility]) - Initial/Assessment Note    Patient Details  Name: Jamie Wilkins MRN: [id number] Date of Birth: 1961/10/22  Transition of Care [facility]) CM/SW Contact:    Gildardo Griffes, LCSW Phone Number: 09/02/2021, 1:44 PM  Clinical Narrative:                  CSW spoke with Nancy Fetter with DSS at [phone] who reports that she had called EMS on Monday after finding patient with feces from head to toe. Patient had reported to DSS that she has not been able to walk for the past 3 months. Patient has neighbors that assisted with feeding and cleaning. DSS reports family has expressed patient is an alcoholic in denial.   Patient has a son Zollie Beckers who lives in [place], a daughter Suan Halter that lives in [place], and a third daughter who is not involved. Family speaks to patient over the phone to talk with her but limited in person contact.   Joy with DSS informed that PT rec is currently SNF, however patient has no insurance and hx of alcoholism which both are barriers to placement. CSW will fax out referrals however CSW expressed to Joy that patient will most likely need to maximize work with PT while in hospital, with plans to dc home. DSS is able to assist with additional home services, and CSW can set up charity home health.   Joy with DSS Requests CSW email financial counselor to start medicaid application for patient, CSW has done so.   SNF referrals faxed out.  Expected Discharge Plan: Home w Home Health Services Barriers to Discharge: Continued Medical Work up   Patient Goals and CMS Choice Patient states their goals for this hospitalization and ongoing recovery are:: to go home CMS Medicare.gov Compare Post Acute Care list provided to:: Patient Represenative (must comment)    Expected Discharge Plan and Services Expected Discharge Plan: Home w Home Health Services       Living arrangements for the past 2 months: Single Family Home                                       Prior Living Arrangements/Services Living arrangements for the past 2 months: Single Family Home Lives with:: Self                   Activities of Daily Living Home Assistive Devices/Equipment: Environmental consultant (specify type) ADL Screening (condition at time of admission) Patient's cognitive ability adequate to safely complete daily activities?: Yes Is the patient deaf or have difficulty hearing?: No Does the patient have difficulty seeing, even when wearing glasses/contacts?: No Does the patient have difficulty concentrating, remembering, or making decisions?: No Patient able to express need for assistance with ADLs?: Yes Does the patient have difficulty dressing or bathing?: Yes Independently performs ADLs?: No Does the patient have difficulty walking or climbing stairs?: Yes Weakness of Legs: Both Weakness of Arms/Hands: Both  Permission Sought/Granted                  Emotional Assessment       Orientation: : Oriented to Self, Oriented to Place, Oriented to  Time, Oriented to Situation Alcohol / Substance Use: Not Applicable Psych Involvement: No (comment)  Admission diagnosis:  Hypokalemia [E87.6] Failure to thrive in adult [R62.7] Hypotension [I95.9] Patient Active Problem List   Diagnosis Date Noted   Depression  due to physical illness 08/31/2021   Decubitus ulcer 08/31/2021   Severe major depression, single episode, without psychotic features (HCC) 08/31/2021   Hypotension 08/30/2021   PAC (premature atrial contraction)    Anemia    Atrial fibrillation with RVR (HCC) 07/20/2021   Colitis 06/12/2021   Melena    AF (paroxysmal atrial fibrillation) (HCC) 06/10/2021   Protein-calorie malnutrition, severe 06/10/2021   Hyponatremia 06/09/2021   Symptomatic anemia 06/09/2021   Malnutrition of moderate degree 02/17/2021   Syncope    Elevated lipase    Electrolyte abnormality    Chest pain 02/15/2021   Hypocalcemia    Hypokalemia     Hypomagnesemia    Alcoholic hepatitis without ascites    PCP:  [facility]:   [facility] [address] [place] - [address] [place] [address] Phone: [phone] Fax: [phone]  [facility] - [place], [place] - [address] [place] [address] Phone: [phone] Fax: [phone]     Social Determinants of Health (SDOH) Interventions    Readmission Risk Interventions No flowsheet data found.

## 2021-09-02 NOTE — Progress Notes (Addendum)
Progress Note    Jamie Wilkins  BPZ:[id number] DOB: [birth year]  DOA: 08/30/2021 PCP: [facility]      Brief Narrative:    Medical records reviewed and are as summarized below:  Jamie Wilkins is a 60 y.o. female with medical history significant for paroxysmal atrial fibrillation not on anticoagulation due to history of GI bleed, alcoholic liver cirrhosis, chronic anemia.  She was brought to the hospital at the recommendation of DSS after she was found sitting at home in her feces.  She complained of generalized weakness and poor oral intake.  She felt so weak that she was unable to get up from her chair.       Assessment/Plan:   Principal Problem:   Severe major depression, single episode, without psychotic features (HCC) Active Problems:   Hypocalcemia   Hypokalemia   Hypomagnesemia   Alcoholic hepatitis without ascites   AF (paroxysmal atrial fibrillation) (HCC)   Hypotension   Depression due to physical illness   Decubitus ulcer   Body mass index is 22.11 kg/m.   Hypotension, dehydration: BP still low.  Patient's BP normally runs low.  Discontinue IV fluids and start midodrine.    Hypokalemia, hypomagnesemia, hypocalcemia and hypophosphatemia: Improved  Vitamin D deficiency: Vitamin D level is 20.3.  Start vitamin D supplement.  Paroxysmal atrial fibrillation with RVR: Intermittently elevated heart rate.  Avoid rate control for hospital because of hypotension.   Depression with suicidal ideation: Patient said she is no longer suicidal.  Continue mirtazapine.  Follow-up with psychiatrist for further recommendations.  Alcohol use disorder: Counseled to quit using alcohol.  Continue thiamine and multivitamins.  She is also on CIWA protocol.  Anemia of chronic disease, folic acid deficiency: No indication for blood transfusion at this time.  Continue folic acid.  Alcoholic liver cirrhosis: Compensated  Right buttock deep tissue  injury (present on admission): Continue local wound care.   Diet Order             Diet regular Room service appropriate? Yes; Fluid consistency: Thin  Diet effective now                      Consultants: Psychiatrist  Procedures: None    Medications:    enoxaparin (LOVENOX) injection  40 mg Subcutaneous Q24H   folic acid  1 mg Oral Daily   mirtazapine  15 mg Oral QHS   multivitamin with minerals  1 tablet Oral Daily   phosphorus  500 mg Oral BID   polyethylene glycol  17 g Oral Daily   thiamine  100 mg Oral Daily   Or   thiamine  100 mg Intravenous Daily   Continuous Infusions:  0.9 % NaCl with KCl 20 mEq / L 75 mL/hr at 09/01/21 2114     Anti-infectives (From admission, onward)    None              Family Communication/Anticipated D/C date and plan/Code Status   DVT prophylaxis: enoxaparin (LOVENOX) injection 40 mg Start: 08/30/21 2330     Code Status: Full Code  Family Communication: None Disposition Plan:    Status is: Inpatient  Remains inpatient appropriate because:Unsafe d/c plan and Inpatient level of care appropriate due to severity of illness  Dispo: The patient is from: Home              Anticipated d/c is to: SNF  Patient currently is not medically stable to d/c.   Difficult to place patient No           Subjective:   Interval events noted.  She has been able to move her bowels.  Abdominal pain has improved.  No chest pain, shortness of breath or palpitations.  She has a sitter at the bedside  Objective:    Vitals:   09/02/21 0426 09/02/21 0443 09/02/21 0922 09/02/21 1143  BP:   101/69 (!) 84/62  Pulse: (!) 108 100 93 83  Resp:   18 18  Temp:   98.4 F (36.9 C) 97.8 F (36.6 C)  TempSrc:   Oral Oral  SpO2:   98% 98%  Weight:      Height:       No data found.   Intake/Output Summary (Last 24 hours) at 09/02/2021 1435 Last data filed at 09/02/2021 1330 Gross per 24 hour  Intake 240 ml   Output --  Net 240 ml   Filed Weights   08/30/21 1936  Weight: 62.1 kg    Exam:  GEN: NAD SKIN: Deep tissue injury on right buttock EYES: No pallor or icterus ENT: MMM CV: RRR PULM: CTA B ABD: soft, ND, NT, +BS CNS: AAO x 3, non focal EXT: No edema or tenderness       Pressure Injury 08/31/21 Buttocks Right Deep Tissue Pressure Injury - Purple or maroon localized area of discolored intact skin or blood-filled blister due to damage of underlying soft tissue from pressure and/or shear. (Active)  08/31/21 1800  Location: Buttocks  Location Orientation: Right  Staging: Deep Tissue Pressure Injury - Purple or maroon localized area of discolored intact skin or blood-filled blister due to damage of underlying soft tissue from pressure and/or shear.  Wound Description (Comments):   Present on Admission: Yes     Data Reviewed:   I have personally reviewed following labs and imaging studies:  Labs: Labs show the following:   Basic Metabolic Panel: Recent Labs  Lab 08/30/21 2007 08/30/21 2051 08/31/21 0621 09/01/21 0504 09/02/21 0625  NA 135  --  137 135 137  K 2.5*  --  2.8* 3.6 3.6  CL 94*  --  102 101 104  CO2 27  --  28 27 28   GLUCOSE 85  --  98 104* 81  BUN 11  --  11 10 8   CREATININE 0.77  --  0.72 0.68 0.67  CALCIUM 7.7*  --  6.9* 7.5* 7.8*  MG  --  1.3*  --  2.3 2.1  PHOS  --  2.2*  --  1.9* 2.6   GFR Estimated Creatinine Clearance: 69.1 mL/min (by C-G formula based on SCr of 0.67 mg/dL). Liver Function Tests: Recent Labs  Lab 08/30/21 2051 09/01/21 0504  AST 145* 97*  ALT 48* 39  ALKPHOS 278* 233*  BILITOT 1.7* 1.1  PROT 5.7* 4.9*  ALBUMIN 2.0* 1.7*   No results for input(s): LIPASE, AMYLASE in the last 168 hours. Recent Labs  Lab 08/30/21 2051  AMMONIA 33   Coagulation profile Recent Labs  Lab 08/30/21 2051  INR 1.1    CBC: Recent Labs  Lab 08/30/21 2007 08/31/21 0621 09/01/21 0504 09/02/21 0625  WBC 6.3 4.7 5.4 4.6   NEUTROABS  --   --  2.8 1.8  HGB 8.6* 7.0* 7.5* 7.7*  HCT 25.3* 20.5* 22.6* 24.3*  MCV 94.4 93.2 92.6 96.0  PLT 157 133* 156 170   Cardiac Enzymes: No  results for input(s): CKTOTAL, CKMB, CKMBINDEX, TROPONINI in the last 168 hours. BNP (last 3 results) No results for input(s): PROBNP in the last 8760 hours. CBG: No results for input(s): GLUCAP in the last 168 hours. D-Dimer: No results for input(s): DDIMER in the last 72 hours. Hgb A1c: No results for input(s): HGBA1C in the last 72 hours. Lipid Profile: No results for input(s): CHOL, HDL, LDLCALC, TRIG, CHOLHDL, LDLDIRECT in the last 72 hours. Thyroid function studies: Recent Labs    08/30/21 03/14/50  TSH 3.569   Anemia work up: Recent Labs    08/30/21 14-Mar-2050 08/31/21 0621  VITAMINB12 1,109*  --   FOLATE 5.2*  --   TIBC  --  NOT CALCULATED  IRON  --  74   Sepsis Labs: Recent Labs  Lab 08/30/21 14-Mar-2006 08/31/21 0621 09/01/21 0504 09/02/21 0625  WBC 6.3 4.7 5.4 4.6    Microbiology Recent Results (from the past 240 hour(s))  Resp Panel by RT-PCR (Flu A&B, Covid) Nasopharyngeal Swab     Status: None   Collection Time: 08/30/21 11:25 PM   Specimen: Nasopharyngeal Swab; Nasopharyngeal(NP) swabs in vial transport medium  Result Value Ref Range Status   SARS Coronavirus 2 by RT PCR NEGATIVE NEGATIVE Final    Comment: (NOTE) SARS-CoV-2 target nucleic acids are NOT DETECTED.  The SARS-CoV-2 RNA is generally detectable in upper respiratory specimens during the acute phase of infection. The lowest concentration of SARS-CoV-2 viral copies this assay can detect is 138 copies/mL. A negative result does not preclude SARS-Cov-2 infection and should not be used as the sole basis for treatment or other patient management decisions. A negative result may occur with  improper specimen collection/handling, submission of specimen other than nasopharyngeal swab, presence of viral mutation(s) within the areas targeted by this assay,  and inadequate number of viral copies(<138 copies/mL). A negative result must be combined with clinical observations, patient history, and epidemiological information. The expected result is Negative.  Fact Sheet for Patients:  BloggerCourse.com  Fact Sheet for Healthcare Providers:  SeriousBroker.it  This test is no t yet approved or cleared by the [place] FDA and  has been authorized for detection and/or diagnosis of SARS-CoV-2 by FDA under an Emergency Use Authorization (EUA). This EUA will remain  in effect (meaning this test can be used) for the duration of the COVID-19 declaration under Section 564(b)(1) of the Act, 21 U.S.C.section 360bbb-3(b)(1), unless the authorization is terminated  or revoked sooner.       Influenza A by PCR NEGATIVE NEGATIVE Final   Influenza B by PCR NEGATIVE NEGATIVE Final    Comment: (NOTE) The Xpert Xpress SARS-CoV-2/FLU/RSV plus assay is intended as an aid in the diagnosis of influenza from Nasopharyngeal swab specimens and should not be used as a sole basis for treatment. Nasal washings and aspirates are unacceptable for Xpert Xpress SARS-CoV-2/FLU/RSV testing.  Fact Sheet for Patients: BloggerCourse.com  Fact Sheet for Healthcare Providers: SeriousBroker.it  This test is not yet approved or cleared by the [place] FDA and has been authorized for detection and/or diagnosis of SARS-CoV-2 by FDA under an Emergency Use Authorization (EUA). This EUA will remain in effect (meaning this test can be used) for the duration of the COVID-19 declaration under Section 564(b)(1) of the Act, 21 U.S.C. section 360bbb-3(b)(1), unless the authorization is terminated or revoked.  Performed at [facility], [address] [place], [place] [address]     Procedures and diagnostic studies:  No results found.  LOS: 2 days   Danzel Marszalek  [facility]   Pager on [email or web address]. If 7PM-7AM, please contact night-coverage at [email or web address]     09/02/2021, 2:35 PM

## 2021-09-02 NOTE — Progress Notes (Signed)
Occupational Therapy Treatment Patient Details Name: DARNELL STIMSON MRN: [id number] DOB: [birth year] Today's Date: 09/02/2021   History of present illness 60 y.o. female who presents to ED following a DSS welfare check; pt and her neighbor called for Meals on Wheels and EMS ultimately brought pt to hospital. Medical history significant for paroxysmal atrial fibrillation not on anticoagulation due to history of GI bleed, alcoholic cirrhosis, anemia.   OT comments  Upon entering the room, pt supine in bed and agreeable to OT intervention. Pt reports, " I gotta do this stuff so I can get to rehab and go home". Pt performs bed mobility with mod A and static sitting balance with close supervision for safety. Pt unable to stand on first attempt and needing manual facilitation for anterior weight shift to stand from standard height bed with mod A. Once standing, pt begins to have BM and needing total A for hygiene in standing. Pt ambulates  5' to recliner chair with RW and min shuffling steps. Pt seated in recliner chair with sitter present in the room. All needs within reach. Pt's HR did increase to 130's with transfer this session.    Recommendations for follow up therapy are one component of a multi-disciplinary discharge planning process, led by the attending physician.  Recommendations may be updated based on patient status, additional functional criteria and insurance authorization.    Follow Up Recommendations  SNF;Supervision/Assistance - 24 hour    Equipment Recommendations  Other (comment) (defer to next venue of care)       Precautions / Restrictions Precautions Precautions: Fall Precaution Comments: monitor HR Restrictions Weight Bearing Restrictions: No       Mobility Bed Mobility Overal bed mobility: Needs Assistance Bed Mobility: Supine to Sit;Sit to Supine     Supine to sit: Mod assist Sit to supine: Mod assist   General bed mobility comments: assistance for trunk  support and to bring hips towards EOB    Transfers Overall transfer level: Needs assistance Equipment used: Rolling walker (2 wheeled) Transfers: Sit to/from Stand Sit to Stand: Mod assist         General transfer comment: Pt needing increased cuing and manual facilitation for anterior weight shift    Balance Overall balance assessment: Needs assistance Sitting-balance support: Bilateral upper extremity supported Sitting balance-Leahy Scale: Fair Sitting balance - Comments: supervision for safety on EOB   Standing balance support: Bilateral upper extremity supported Standing balance-Leahy Scale: Poor                             ADL either performed or assessed with clinical judgement     Vision Patient Visual Report: No change from baseline            Cognition Arousal/Alertness: Awake/alert Behavior During Therapy: WFL for tasks assessed/performed Overall Cognitive Status: Within Functional Limits for tasks assessed                                 General Comments: Pt needing increased time to process information                   Pertinent Vitals/ Pain       Pain Assessment: No/denies pain         Frequency  Min 2X/week        Progress Toward Goals  OT Goals(current goals can now be found in  the care plan section)  Progress towards OT goals: Progressing toward goals  Acute Rehab OT Goals Patient Stated Goal: to go to rehab OT Goal Formulation: With patient Time For Goal Achievement: 09/15/21 Potential to Achieve Goals: Good  Plan Discharge plan remains appropriate;Frequency remains appropriate       AM-PAC OT "6 Clicks" Daily Activity     Outcome Measure   Help from another person eating meals?: None Help from another person taking care of personal grooming?: A Little Help from another person toileting, which includes using toliet, bedpan, or urinal?: A Lot Help from another person bathing (including washing,  rinsing, drying)?: A Lot Help from another person to put on and taking off regular upper body clothing?: A Little Help from another person to put on and taking off regular lower body clothing?: A Lot 6 Click Score: 16    End of Session Equipment Utilized During Treatment: Rolling walker  OT Visit Diagnosis: Unsteadiness on feet (R26.81);Muscle weakness (generalized) (M62.81)   Activity Tolerance Patient limited by pain;Patient limited by fatigue   Patient Left in bed;with call bell/phone within reach;with nursing/sitter in room   Nurse Communication Mobility status;Other (comment) (elevated HR)        Time: 1340-1404 OT Time Calculation (min): 24 min  Charges: OT General Charges $OT Visit: 1 Visit OT Treatments $Self Care/Home Management : 8-22 mins $Therapeutic Activity: 8-22 mins  Jackquline Denmark, MS, OTR/L , CBIS ascom [phone]  09/02/21, 3:41 PM

## 2021-09-02 NOTE — Consult Note (Signed)
[facility] Face-to-Face Psychiatry Consult   Reason for Consult: Follow-up consult 60 year old woman with depression Referring Physician:  Myriam Forehand Patient Identification: Jamie Wilkins MRN:  [id number] Principal Diagnosis: Severe major depression, single episode, without psychotic features (HCC) Diagnosis:  Principal Problem:   Severe major depression, single episode, without psychotic features (HCC) Active Problems:   Hypocalcemia   Hypokalemia   Hypomagnesemia   Alcoholic hepatitis without ascites   AF (paroxysmal atrial fibrillation) (HCC)   Hypotension   Depression due to physical illness   Decubitus ulcer   Total Time spent with patient: 30 minutes  Subjective:   Jamie Wilkins is a 60 y.o. female patient admitted with "I am feeling pretty good".  HPI: Patient seen chart reviewed.  Patient reports that her mood is feeling much better.  Denies any clear sense of depression.  Denies suicidal or homicidal thought.  Denies psychosis.  Making an effort to eat more and participate in physical therapy.  Past Psychiatric History: Past history of depression and alcohol abuse  Risk to Self:   Risk to Others:   Prior Inpatient Therapy:   Prior Outpatient Therapy:    Past Medical History:  Past Medical History:  Diagnosis Date   Asthma    Heavy menstrual period    Hypertension     Past Surgical History:  Procedure Laterality Date   ABDOMINAL HYSTERECTOMY     partial   COLONOSCOPY N/A 06/12/2021   Procedure: COLONOSCOPY;  Surgeon: Toney Reil, MD;  Location: [facility] ENDOSCOPY;  Service: Gastroenterology;  Laterality: N/A;   ESOPHAGOGASTRODUODENOSCOPY N/A 06/10/2021   Procedure: ESOPHAGOGASTRODUODENOSCOPY (EGD);  Surgeon: Toledo, Boykin Nearing, MD;  Location: [facility] ENDOSCOPY;  Service: Gastroenterology;  Laterality: N/A;   FOOT SURGERY     GIVENS CAPSULE STUDY N/A 06/12/2021   Procedure: GIVENS CAPSULE STUDY;  Surgeon: Toney Reil, MD;  Location: [facility] ENDOSCOPY;   Service: Gastroenterology;  Laterality: N/A;   S/P BTL     Family History:  Family History  Problem Relation Age of Onset   CAD Father    CAD Brother    CAD Brother    Family Psychiatric  History: See previous Social History:  Social History   Substance and Sexual Activity  Alcohol Use Yes   Alcohol/week: 7.0 standard drinks   Types: 7 Standard drinks or equivalent per week     Social History   Substance and Sexual Activity  Drug Use Never    Social History   Socioeconomic History   Marital status: Divorced    Spouse name: Not on file   Number of children: Not on file   Years of education: Not on file   Highest education level: Not on file  Occupational History   Not on file  Tobacco Use   Smoking status: Every Day    Packs/day: 0.50    Types: Cigarettes   Smokeless tobacco: Never  Vaping Use   Vaping Use: Never used  Substance and Sexual Activity   Alcohol use: Yes    Alcohol/week: 7.0 standard drinks    Types: 7 Standard drinks or equivalent per week   Drug use: Never   Sexual activity: Not on file  Other Topics Concern   Not on file  Social History Narrative   Not on file   Social Determinants of Health   Financial Resource Strain: Not on file  Food Insecurity: Not on file  Transportation Needs: Not on file  Physical Activity: Not on file  Stress: Not on file  Social  Connections: Not on file   Additional Social History:    Allergies:  No Known Allergies  Labs:  Results for orders placed or performed during the hospital encounter of 08/30/21 (from the past 48 hour(s))  CBC with Differential/Platelet     Status: Abnormal   Collection Time: 09/01/21  5:04 AM  Result Value Ref Range   WBC 5.4 4.0 - 10.5 K/uL   RBC 2.44 (L) 3.87 - 5.11 MIL/uL   Hemoglobin 7.5 (L) 12.0 - 15.0 g/dL   HCT 63.1 (L) 49.7 - 02.6 %   MCV 92.6 80.0 - 100.0 fL   MCH 30.7 26.0 - 34.0 pg   MCHC 33.2 30.0 - 36.0 g/dL   RDW 37.8 (H) 58.8 - 50.2 %   Platelets 156 150 -  400 K/uL   nRBC 2.2 (H) 0.0 - 0.2 %   Neutrophils Relative % 51 %   Neutro Abs 2.8 1.7 - 7.7 K/uL   Lymphocytes Relative 31 %   Lymphs Abs 1.7 0.7 - 4.0 K/uL   Monocytes Relative 16 %   Monocytes Absolute 0.8 0.1 - 1.0 K/uL   Eosinophils Relative 1 %   Eosinophils Absolute 0.0 0.0 - 0.5 K/uL   Basophils Relative 0 %   Basophils Absolute 0.0 0.0 - 0.1 K/uL   Immature Granulocytes 1 %   Abs Immature Granulocytes 0.03 0.00 - 0.07 K/uL    Comment: Performed at [facility], [address] [place], [place] [address]  Comprehensive metabolic panel     Status: Abnormal   Collection Time: 09/01/21  5:04 AM  Result Value Ref Range   Sodium 135 135 - 145 mmol/L   Potassium 3.6 3.5 - 5.1 mmol/L   Chloride 101 98 - 111 mmol/L   CO2 27 22 - 32 mmol/L   Glucose, Bld 104 (H) 70 - 99 mg/dL    Comment: Glucose reference range applies only to samples taken after fasting for at least 8 hours.   BUN 10 8 - 23 mg/dL   Creatinine, Ser 8.78 0.44 - 1.00 mg/dL   Calcium 7.5 (L) 8.9 - 10.3 mg/dL   Total Protein 4.9 (L) 6.5 - 8.1 g/dL   Albumin 1.7 (L) 3.5 - 5.0 g/dL   AST 97 (H) 15 - 41 U/L   ALT 39 0 - 44 U/L   Alkaline Phosphatase 233 (H) 38 - 126 U/L   Total Bilirubin 1.1 0.3 - 1.2 mg/dL   GFR, Estimated >67 >67 mL/min    Comment: (NOTE) Calculated using the CKD-EPI Creatinine Equation (2021)    Anion gap 7 5 - 15    Comment: Performed at [facility], [address] [place], [place] [address]  Magnesium     Status: None   Collection Time: 09/01/21  5:04 AM  Result Value Ref Range   Magnesium 2.3 1.7 - 2.4 mg/dL    Comment: Performed at [facility], [address], [place], [place] [address]  Phosphorus     Status: Abnormal   Collection Time: 09/01/21  5:04 AM  Result Value Ref Range   Phosphorus 1.9 (L) 2.5 - 4.6 mg/dL    Comment: Performed at [facility], [address] [place], [place] [address]  VITAMIN D 25 Hydroxy (Vit-D Deficiency,  Fractures)     Status: Abnormal   Collection Time: 09/02/21  6:25 AM  Result Value Ref Range   Vit D, 25-Hydroxy 20.30 (L) 30 - 100 ng/mL    Comment: (NOTE) Vitamin D  deficiency has been defined by the Institute of Medicine  and an Endocrine Society practice guideline as a level of serum 25-OH  vitamin D less than 20 ng/mL (1,2). The Endocrine Society went on to  further define vitamin D insufficiency as a level between 21 and 29  ng/mL (2).  1. IOM (Institute of Medicine). 2010. Dietary reference intakes for  calcium and D. [place]: [employer]. 2. Holick MF, Binkley Winfield, Bischoff-Ferrari HA, et al. Evaluation,  treatment, and prevention of vitamin D deficiency: an Endocrine  Society clinical practice guideline, JCEM. 2011 Jul; 96(7): 1911-30.  Performed at [facility], [address], [place], [place] [address]   CBC with Differential/Platelet     Status: Abnormal   Collection Time: 09/02/21  6:25 AM  Result Value Ref Range   WBC 4.6 4.0 - 10.5 K/uL   RBC 2.53 (L) 3.87 - 5.11 MIL/uL   Hemoglobin 7.7 (L) 12.0 - 15.0 g/dL   HCT 24.2 (L) 68.3 - 41.9 %   MCV 96.0 80.0 - 100.0 fL   MCH 30.4 26.0 - 34.0 pg   MCHC 31.7 30.0 - 36.0 g/dL   RDW 62.2 (H) 29.7 - 98.9 %   Platelets 170 150 - 400 K/uL   nRBC 0.9 (H) 0.0 - 0.2 %   Neutrophils Relative % 39 %   Neutro Abs 1.8 1.7 - 7.7 K/uL   Lymphocytes Relative 41 %   Lymphs Abs 1.9 0.7 - 4.0 K/uL   Monocytes Relative 19 %   Monocytes Absolute 0.9 0.1 - 1.0 K/uL   Eosinophils Relative 1 %   Eosinophils Absolute 0.0 0.0 - 0.5 K/uL   Basophils Relative 0 %   Basophils Absolute 0.0 0.0 - 0.1 K/uL   Immature Granulocytes 0 %   Abs Immature Granulocytes 0.02 0.00 - 0.07 K/uL    Comment: Performed at [facility], [address], [place], [place] [address]  Basic metabolic panel     Status: Abnormal   Collection Time: 09/02/21  6:25 AM  Result Value Ref Range   Sodium 137 135 - 145 mmol/L    Potassium 3.6 3.5 - 5.1 mmol/L   Chloride 104 98 - 111 mmol/L   CO2 28 22 - 32 mmol/L   Glucose, Bld 81 70 - 99 mg/dL    Comment: Glucose reference range applies only to samples taken after fasting for at least 8 hours.   BUN 8 8 - 23 mg/dL   Creatinine, Ser 1.74 0.44 - 1.00 mg/dL   Calcium 7.8 (L) 8.9 - 10.3 mg/dL   GFR, Estimated >08 >14 mL/min    Comment: (NOTE) Calculated using the CKD-EPI Creatinine Equation (2021)    Anion gap 5 5 - 15    Comment: Performed at [facility], [address] [place], [place] [address]  Magnesium     Status: None   Collection Time: 09/02/21  6:25 AM  Result Value Ref Range   Magnesium 2.1 1.7 - 2.4 mg/dL    Comment: Performed at [facility], [address] [place], [place] [address]  Phosphorus     Status: None   Collection Time: 09/02/21  6:25 AM  Result Value Ref Range   Phosphorus 2.6 2.5 - 4.6 mg/dL    Comment: Performed at [facility], [address] [place], [place] [address]    Current Facility-Administered Medications  Medication Dose Route Frequency Provider Last Rate Last Admin   alum & mag hydroxide-simeth (  MAALOX/MYLANTA) 200-200-20 MG/5ML suspension 30 mL  30 mL Oral Q4H PRN Marrion Coy, MD   30 mL at 08/31/21 2139   cholecalciferol (VITAMIN D3) tablet 1,000 Units  1,000 Units Oral Daily Lurene Shadow, MD   1,000 Units at 09/02/21 1528   enoxaparin (LOVENOX) injection 40 mg  40 mg Subcutaneous Q24H Tu, Ching T, DO   40 mg at 09/01/21 2303   folic acid (FOLVITE) tablet 1 mg  1 mg Oral Daily Tu, Ching T, DO   1 mg at 09/02/21 1610   hydrocortisone cream 1 %   Topical PRN Tu, Ching T, DO       LORazepam (ATIVAN) tablet 1-4 mg  1-4 mg Oral Q1H PRN Tu, Ching T, DO       Or   LORazepam (ATIVAN) injection 1-4 mg  1-4 mg Intravenous Q1H PRN Tu, Ching T, DO       midodrine (PROAMATINE) tablet 5 mg  5 mg Oral TID WC Lurene Shadow, MD   5 mg at 09/02/21 1528   mirtazapine (REMERON) tablet 30 mg  30 mg  Oral QHS Levoy Geisen, Jackquline Denmark, MD       multivitamin with minerals tablet 1 tablet  1 tablet Oral Daily Tu, Ching T, DO   1 tablet at 09/02/21 9604   phosphorus (K PHOS NEUTRAL) tablet 500 mg  500 mg Oral BID Lurene Shadow, MD   500 mg at 09/02/21 5409   polyethylene glycol (MIRALAX / GLYCOLAX) packet 17 g  17 g Oral Daily Lurene Shadow, MD   17 g at 09/01/21 1128   thiamine tablet 100 mg  100 mg Oral Daily Tu, Ching T, DO   100 mg at 09/02/21 0808   Or   thiamine (B-1) injection 100 mg  100 mg Intravenous Daily Tu, Ching T, DO       Zinc Oxide (TRIPLE PASTE) 12.8 % ointment   Topical PRN Tu, Ching T, DO        Musculoskeletal: Strength & Muscle Tone: within normal limits Gait & Station: normal Patient leans: N/A            Psychiatric Specialty Exam:  Presentation  General Appearance: Appropriate for Environment  Eye Contact:Good  Speech:Clear and Coherent  Speech Volume:Normal  Handedness:Right   Mood and Affect  Mood:Depressed  Affect:Congruent; Blunt; Depressed   Thought Process  Thought Processes:Coherent  Descriptions of Associations:Intact  Orientation:Full (Time, Place and Person)  Thought Content:Logical  History of Schizophrenia/Schizoaffective disorder:No  Duration of Psychotic Symptoms:No data recorded Hallucinations:No data recorded Ideas of Reference:None  Suicidal Thoughts:No data recorded Homicidal Thoughts:No data recorded  Sensorium  Memory:Immediate Good; Recent Good; Remote Good  Judgment:Poor  Insight:Poor   Executive Functions  Concentration:Fair  Attention Span:Fair  Recall:Fair  Fund of Knowledge:Fair  Language:Fair   Psychomotor Activity  Psychomotor Activity: No data recorded  Assets  Assets:Communication Skills; Desire for Improvement; Physical Health; Resilience; Social Support   Sleep  Sleep: No data recorded  Physical Exam: Physical Exam Vitals and nursing note reviewed.  Constitutional:       Appearance: Normal appearance.  HENT:     Head: Normocephalic and atraumatic.     Mouth/Throat:     Pharynx: Oropharynx is clear.  Eyes:     Pupils: Pupils are equal, round, and reactive to light.  Cardiovascular:     Rate and Rhythm: Normal rate and regular rhythm.  Pulmonary:     Effort: Pulmonary effort is normal.     Breath sounds: Normal  breath sounds.  Abdominal:     General: Abdomen is flat.     Palpations: Abdomen is soft.  Musculoskeletal:        General: Normal range of motion.  Skin:    General: Skin is warm and dry.  Neurological:     General: No focal deficit present.     Mental Status: She is alert. Mental status is at baseline.  Psychiatric:        Mood and Affect: Mood normal.        Thought Content: Thought content normal.   Review of Systems  Constitutional: Negative.   HENT: Negative.    Eyes: Negative.   Respiratory: Negative.    Cardiovascular: Negative.   Gastrointestinal: Negative.   Musculoskeletal: Negative.   Skin: Negative.   Neurological: Negative.   Psychiatric/Behavioral: Negative.    Blood pressure 91/71, pulse 87, temperature 97.8 F (36.6 C), resp. rate 18, height 5\' 6"  (1.676 m), weight 62.1 kg, last menstrual period 05/11/2010, SpO2 98 %. Body mass index is 22.11 kg/m.  Treatment Plan Summary: Plan patient is not currently meeting commitment criteria.  I am discontinuing the IVC order.  Paperwork will be tube to the unit.  Increase mirtazapine to 30 mg at night.  Follow as needed  Disposition: Patient does not meet criteria for psychiatric inpatient admission. Supportive therapy provided about ongoing stressors.  05/13/2010, MD 09/02/2021 5:24 PM

## 2021-09-03 MED ORDER — NICOTINE 14 MG/24HR TD PT24: 14.0000 mg | MEDICATED_PATCH | Freq: Every day | TRANSDERMAL | Status: DC

## 2021-09-03 MED ORDER — NICOTINE 14 MG/24HR TD PT24
14.0000 mg | MEDICATED_PATCH | Freq: Every day | TRANSDERMAL | Status: DC
  Administered 2021-09-03 – 2021-09-05 (×3): 14 mg via TRANSDERMAL
  Filled 2021-09-03 (×8): qty 1

## 2021-09-03 NOTE — Plan of Care (Signed)

## 2021-09-03 NOTE — Progress Notes (Signed)
Report called to [facility] on 1C

## 2021-09-03 NOTE — Consult Note (Signed)
[facility] Face-to-Face Psychiatry Consult   Reason for Consult: Follow-up consult with this 60 year old woman brought into the hospital with severe depression Referring Physician:  Myriam Forehand Patient Identification: Jamie Wilkins MRN:  [id number] Principal Diagnosis: Severe major depression, single episode, without psychotic features (HCC) Diagnosis:  Principal Problem:   Severe major depression, single episode, without psychotic features (HCC) Active Problems:   Hypocalcemia   Hypokalemia   Hypomagnesemia   Alcoholic hepatitis without ascites   AF (paroxysmal atrial fibrillation) (HCC)   Hypotension   Depression due to physical illness   Decubitus ulcer   Total Time spent with patient: 20 minutes  Subjective:   Jamie Wilkins is a 61 y.o. female patient admitted with "I am better".  HPI: Patient seen chart reviewed.  Patient continues to report that her mood is feeling better.  Denies any suicidal thought.  Denies being depressed.  Eating better.  Sleeping better.  Past Psychiatric History: Past history of depression and alcohol abuse  Risk to Self:   Risk to Others:   Prior Inpatient Therapy:   Prior Outpatient Therapy:    Past Medical History:  Past Medical History:  Diagnosis Date   Asthma    Heavy menstrual period    Hypertension     Past Surgical History:  Procedure Laterality Date   ABDOMINAL HYSTERECTOMY     partial   COLONOSCOPY N/A 06/12/2021   Procedure: COLONOSCOPY;  Surgeon: Toney Reil, MD;  Location: [facility] ENDOSCOPY;  Service: Gastroenterology;  Laterality: N/A;   ESOPHAGOGASTRODUODENOSCOPY N/A 06/10/2021   Procedure: ESOPHAGOGASTRODUODENOSCOPY (EGD);  Surgeon: Toledo, Boykin Nearing, MD;  Location: [facility] ENDOSCOPY;  Service: Gastroenterology;  Laterality: N/A;   FOOT SURGERY     GIVENS CAPSULE STUDY N/A 06/12/2021   Procedure: GIVENS CAPSULE STUDY;  Surgeon: Toney Reil, MD;  Location: [facility] ENDOSCOPY;  Service: Gastroenterology;  Laterality: N/A;    S/P BTL     Family History:  Family History  Problem Relation Age of Onset   CAD Father    CAD Brother    CAD Brother    Family Psychiatric  History: See previous Social History:  Social History   Substance and Sexual Activity  Alcohol Use Yes   Alcohol/week: 7.0 standard drinks   Types: 7 Standard drinks or equivalent per week     Social History   Substance and Sexual Activity  Drug Use Never    Social History   Socioeconomic History   Marital status: Divorced    Spouse name: Not on file   Number of children: Not on file   Years of education: Not on file   Highest education level: Not on file  Occupational History   Not on file  Tobacco Use   Smoking status: Every Day    Packs/day: 0.50    Types: Cigarettes   Smokeless tobacco: Never  Vaping Use   Vaping Use: Never used  Substance and Sexual Activity   Alcohol use: Yes    Alcohol/week: 7.0 standard drinks    Types: 7 Standard drinks or equivalent per week   Drug use: Never   Sexual activity: Not on file  Other Topics Concern   Not on file  Social History Narrative   Not on file   Social Determinants of Health   Financial Resource Strain: Not on file  Food Insecurity: Not on file  Transportation Needs: Not on file  Physical Activity: Not on file  Stress: Not on file  Social Connections: Not on file   Additional  Social History:    Allergies:  No Known Allergies  Labs:  Results for orders placed or performed during the hospital encounter of 08/30/21 (from the past 48 hour(s))  VITAMIN D 25 Hydroxy (Vit-D Deficiency, Fractures)     Status: Abnormal   Collection Time: 09/02/21  6:25 AM  Result Value Ref Range   Vit D, 25-Hydroxy 20.30 (L) 30 - 100 ng/mL    Comment: (NOTE) Vitamin D deficiency has been defined by the Institute of Medicine  and an Endocrine Society practice guideline as a level of serum 25-OH  vitamin D less than 20 ng/mL (1,2). The Endocrine Society went on to  further define  vitamin D insufficiency as a level between 21 and 29  ng/mL (2).  1. IOM (Institute of Medicine). 2010. Dietary reference intakes for  calcium and D. [place]: [employer]. 2. Holick MF, Binkley [place], Bischoff-Ferrari HA, et al. Evaluation,  treatment, and prevention of vitamin D deficiency: an Endocrine  Society clinical practice guideline, JCEM. 2011 Jul; 96(7): 1911-30.  Performed at [facility], [address] [place], [place] [address]   CBC with Differential/Platelet     Status: Abnormal   Collection Time: 09/02/21  6:25 AM  Result Value Ref Range   WBC 4.6 4.0 - 10.5 K/uL   RBC 2.53 (L) 3.87 - 5.11 MIL/uL   Hemoglobin 7.7 (L) 12.0 - 15.0 g/dL   HCT 31.4 (L) 97.0 - 26.3 %   MCV 96.0 80.0 - 100.0 fL   MCH 30.4 26.0 - 34.0 pg   MCHC 31.7 30.0 - 36.0 g/dL   RDW 78.5 (H) 88.5 - 02.7 %   Platelets 170 150 - 400 K/uL   nRBC 0.9 (H) 0.0 - 0.2 %   Neutrophils Relative % 39 %   Neutro Abs 1.8 1.7 - 7.7 K/uL   Lymphocytes Relative 41 %   Lymphs Abs 1.9 0.7 - 4.0 K/uL   Monocytes Relative 19 %   Monocytes Absolute 0.9 0.1 - 1.0 K/uL   Eosinophils Relative 1 %   Eosinophils Absolute 0.0 0.0 - 0.5 K/uL   Basophils Relative 0 %   Basophils Absolute 0.0 0.0 - 0.1 K/uL   Immature Granulocytes 0 %   Abs Immature Granulocytes 0.02 0.00 - 0.07 K/uL    Comment: Performed at [facility], [address] [place], [place] [address]  Basic metabolic panel     Status: Abnormal   Collection Time: 09/02/21  6:25 AM  Result Value Ref Range   Sodium 137 135 - 145 mmol/L   Potassium 3.6 3.5 - 5.1 mmol/L   Chloride 104 98 - 111 mmol/L   CO2 28 22 - 32 mmol/L   Glucose, Bld 81 70 - 99 mg/dL    Comment: Glucose reference range applies only to samples taken after fasting for at least 8 hours.   BUN 8 8 - 23 mg/dL   Creatinine, Ser 7.86 0.44 - 1.00 mg/dL   Calcium 7.8 (L) 8.9 - 10.3 mg/dL   GFR, Estimated >76 >72 mL/min    Comment: (NOTE) Calculated using  the CKD-EPI Creatinine Equation (2021)    Anion gap 5 5 - 15    Comment: Performed at [facility], [address] [place], [place] [address]  Magnesium     Status: None   Collection Time: 09/02/21  6:25 AM  Result Value Ref Range   Magnesium 2.1 1.7 - 2.4 mg/dL    Comment: Performed at [employer]  [facility], [address], [place], [place] [address]  Phosphorus     Status: None   Collection Time: 09/02/21  6:25 AM  Result Value Ref Range   Phosphorus 2.6 2.5 - 4.6 mg/dL    Comment: Performed at [facility], [address] [place], [place] [address]  Glucose, capillary     Status: Abnormal   Collection Time: 09/02/21  8:35 PM  Result Value Ref Range   Glucose-Capillary 106 (H) 70 - 99 mg/dL    Comment: Glucose reference range applies only to samples taken after fasting for at least 8 hours.    Current Facility-Administered Medications  Medication Dose Route Frequency Provider Last Rate Last Admin   alum & mag hydroxide-simeth (MAALOX/MYLANTA) 200-200-20 MG/5ML suspension 30 mL  30 mL Oral Q4H PRN Marrion Coy, MD   30 mL at 08/31/21 2139   cholecalciferol (VITAMIN D3) tablet 1,000 Units  1,000 Units Oral Daily Lurene Shadow, MD   1,000 Units at 09/03/21 0838   enoxaparin (LOVENOX) injection 40 mg  40 mg Subcutaneous Q24H Tu, Ching T, DO   40 mg at 09/02/21 2332   folic acid (FOLVITE) tablet 1 mg  1 mg Oral Daily Tu, Ching T, DO   1 mg at 09/03/21 3500   hydrocortisone cream 1 %   Topical PRN Tu, Ching T, DO       midodrine (PROAMATINE) tablet 5 mg  5 mg Oral TID WC Lurene Shadow, MD   5 mg at 09/03/21 1256   mirtazapine (REMERON) tablet 30 mg  30 mg Oral QHS Terrius Gentile T, MD   30 mg at 09/02/21 2037   multivitamin with minerals tablet 1 tablet  1 tablet Oral Daily Tu, Ching T, DO   1 tablet at 09/03/21 9381   polyethylene glycol (MIRALAX / GLYCOLAX) packet 17 g  17 g Oral Daily Lurene Shadow, MD   17 g at 09/03/21 8299   thiamine tablet 100 mg  100 mg  Oral Daily Tu, Ching T, DO   100 mg at 09/03/21 3716   Or   thiamine (B-1) injection 100 mg  100 mg Intravenous Daily Tu, Ching T, DO       Zinc Oxide (TRIPLE PASTE) 12.8 % ointment   Topical PRN Tu, Ching T, DO        Musculoskeletal: Strength & Muscle Tone: within normal limits Gait & Station: normal Patient leans: N/A            Psychiatric Specialty Exam:  Presentation  General Appearance: Appropriate for Environment  Eye Contact:Good  Speech:Clear and Coherent  Speech Volume:Normal  Handedness:Right   Mood and Affect  Mood:Depressed  Affect:Congruent; Blunt; Depressed   Thought Process  Thought Processes:Coherent  Descriptions of Associations:Intact  Orientation:Full (Time, Place and Person)  Thought Content:Logical  History of Schizophrenia/Schizoaffective disorder:No  Duration of Psychotic Symptoms:No data recorded Hallucinations:No data recorded Ideas of Reference:None  Suicidal Thoughts:No data recorded Homicidal Thoughts:No data recorded  Sensorium  Memory:Immediate Good; Recent Good; Remote Good  Judgment:Poor  Insight:Poor   Executive Functions  Concentration:Fair  Attention Span:Fair  Recall:Fair  Fund of Knowledge:Fair  Language:Fair   Psychomotor Activity  Psychomotor Activity: No data recorded  Assets  Assets:Communication Skills; Desire for Improvement; Physical Health; Resilience; Social Support   Sleep  Sleep: No data recorded  Physical Exam: Physical Exam Vitals and nursing note reviewed.  Constitutional:      Appearance: Normal appearance.  HENT:     Head: Normocephalic and atraumatic.  Mouth/Throat:     Pharynx: Oropharynx is clear.  Eyes:     Pupils: Pupils are equal, round, and reactive to light.  Cardiovascular:     Rate and Rhythm: Normal rate and regular rhythm.  Pulmonary:     Effort: Pulmonary effort is normal.     Breath sounds: Normal breath sounds.  Abdominal:     General:  Abdomen is flat.     Palpations: Abdomen is soft.  Musculoskeletal:        General: Normal range of motion.  Skin:    General: Skin is warm and dry.  Neurological:     General: No focal deficit present.     Mental Status: She is alert. Mental status is at baseline.  Psychiatric:        Attention and Perception: Attention normal.        Mood and Affect: Mood normal. Affect is blunt.        Speech: Speech normal.        Behavior: Behavior is slowed.        Thought Content: Thought content normal.   Review of Systems  Constitutional: Negative.   HENT: Negative.    Eyes: Negative.   Respiratory: Negative.    Cardiovascular: Negative.   Gastrointestinal: Negative.   Musculoskeletal: Negative.   Skin: Negative.   Neurological: Negative.   Psychiatric/Behavioral: Negative.    Blood pressure 94/74, pulse 89, temperature 98 F (36.7 C), temperature source Oral, resp. rate 17, height 5\' 6"  (1.676 m), weight 62.1 kg, last menstrual period 05/11/2010, SpO2 99 %. Body mass index is 22.11 kg/m.  Treatment Plan Summary: Plan patient now on 30 mg of mirtazapine.  Will not change dose right now.  Might benefit from increased later but I would give it at least a week.  Patient appears to stabilized not currently showing any signs of acute need for commitment or psychiatric hospitalization.  We can follow as needed but generally will sign off as patient is probably at the point of being discharged to skilled nursing soon.  Disposition: No evidence of imminent risk to self or others at present.   Patient does not meet criteria for psychiatric inpatient admission. Supportive therapy provided about ongoing stressors.  05/13/2010, MD 09/03/2021 4:24 PM

## 2021-09-03 NOTE — Progress Notes (Addendum)
Progress Note    Jamie Wilkins  ZYS:[id number] DOB: December 24, 1960  DOA: 08/30/2021 PCP: [facility]      Brief Narrative:    Medical records reviewed and are as summarized below:  Jamie Wilkins is a 60 y.o. female with medical history significant for paroxysmal atrial fibrillation not on anticoagulation due to history of GI bleed, alcoholic liver cirrhosis, chronic anemia.  She was brought to the hospital at the recommendation of DSS after she was found sitting at home in her feces.  She complained of generalized weakness and poor oral intake.  She felt so weak that she was unable to get up from her chair.       Assessment/Plan:   Principal Problem:   Severe major depression, single episode, without psychotic features (HCC) Active Problems:   Hypocalcemia   Hypokalemia   Hypomagnesemia   Alcoholic hepatitis without ascites   AF (paroxysmal atrial fibrillation) (HCC)   Hypotension   Depression due to physical illness   Decubitus ulcer   Body mass index is 22.11 kg/m.   Hypotension, dehydration: BP is better.  Continue midodrine.  Paroxysmal atrial fibrillation with RVR: Intermittently elevated heart rate.  Overall, heart rate is better.  Avoid rate control for hospital because of hypotension.   Depression with suicidal ideation: She no longer has a one-to-one Comptroller for safety.  IVC has been revoked by the psychiatrist.  Continue mirtazapine.  Alcohol use disorder: Counseled to quit using alcohol.  Continue thiamine and multivitamins.  She is also on CIWA protocol.  Anemia of chronic disease, folic acid deficiency: No indication for blood transfusion at this time.  Continue folic acid.   Hypokalemia, hypomagnesemia, hypocalcemia and hypophosphatemia: Improved  Vitamin D deficiency: Vitamin D level is 20.3.  Continue vitamin D supplement  Alcoholic liver cirrhosis: Compensated  Right buttock deep tissue injury (present on  admission): Continue local wound care.   Diet Order             Diet regular Room service appropriate? Yes; Fluid consistency: Thin  Diet effective now                      Consultants: Psychiatrist  Procedures: None    Medications:    cholecalciferol  1,000 Units Oral Daily   enoxaparin (LOVENOX) injection  40 mg Subcutaneous Q24H   folic acid  1 mg Oral Daily   midodrine  5 mg Oral TID WC   mirtazapine  30 mg Oral QHS   multivitamin with minerals  1 tablet Oral Daily   polyethylene glycol  17 g Oral Daily   thiamine  100 mg Oral Daily   Or   thiamine  100 mg Intravenous Daily   Continuous Infusions:     Anti-infectives (From admission, onward)    None              Family Communication/Anticipated D/C date and plan/Code Status   DVT prophylaxis: enoxaparin (LOVENOX) injection 40 mg Start: 08/30/21 2330     Code Status: Full Code  Family Communication: None Disposition Plan:    Status is: Inpatient  Remains inpatient appropriate because:Unsafe d/c plan and Inpatient level of care appropriate due to severity of illness  Dispo: The patient is from: Home              Anticipated d/c is to: SNF              Patient currently is medically  stable to d/c.   Difficult to place patient Yes           Subjective:   Interval events noted.  She inquired about discharge plans.    Objective:    Vitals:   09/03/21 0025 09/03/21 0406 09/03/21 0823 09/03/21 1107  BP: 103/83 103/83 (!) 89/72 105/71  Pulse: 97 (!) 108 84 93  Resp: 18 18 17 17   Temp: 98.2 F (36.8 C) 98.6 F (37 C) 98 F (36.7 C) 97.8 F (36.6 C)  TempSrc: Oral Oral Oral Oral  SpO2: 92% 99%  100%  Weight:      Height:       No data found.   Intake/Output Summary (Last 24 hours) at 09/03/2021 1323 Last data filed at 09/03/2021 1106 Gross per 24 hour  Intake 1840.48 ml  Output 500 ml  Net 1340.48 ml   Filed Weights   08/30/21 1936  Weight: 62.1 kg     Exam:  GEN: NAD SKIN: Warm and dry. EYES: No pallor or icterus ENT: MMM CV: RRR PULM: CTA B ABD: soft, ND, NT, +BS CNS: AAO x 3, non focal EXT: No edema or tenderness      Data Reviewed:   I have personally reviewed following labs and imaging studies:  Labs: Labs show the following:   Basic Metabolic Panel: Recent Labs  Lab 08/30/21 2007 08/30/21 2051 08/31/21 0621 09/01/21 0504 09/02/21 0625  NA 135  --  137 135 137  K 2.5*  --  2.8* 3.6 3.6  CL 94*  --  102 101 104  CO2 27  --  28 27 28   GLUCOSE 85  --  98 104* 81  BUN 11  --  11 10 8   CREATININE 0.77  --  0.72 0.68 0.67  CALCIUM 7.7*  --  6.9* 7.5* 7.8*  MG  --  1.3*  --  2.3 2.1  PHOS  --  2.2*  --  1.9* 2.6   GFR Estimated Creatinine Clearance: 69.1 mL/min (by C-G formula based on SCr of 0.67 mg/dL). Liver Function Tests: Recent Labs  Lab 08/30/21 2051 09/01/21 0504  AST 145* 97*  ALT 48* 39  ALKPHOS 278* 233*  BILITOT 1.7* 1.1  PROT 5.7* 4.9*  ALBUMIN 2.0* 1.7*   No results for input(s): LIPASE, AMYLASE in the last 168 hours. Recent Labs  Lab 08/30/21 2051  AMMONIA 33   Coagulation profile Recent Labs  Lab 08/30/21 2051  INR 1.1    CBC: Recent Labs  Lab 08/30/21 2007 08/31/21 2052 09/01/21 0504 09/02/21 0625  WBC 6.3 4.7 5.4 4.6  NEUTROABS  --   --  2.8 1.8  HGB 8.6* 7.0* 7.5* 7.7*  HCT 25.3* 20.5* 22.6* 24.3*  MCV 94.4 93.2 92.6 96.0  PLT 157 133* 156 170   Cardiac Enzymes: No results for input(s): CKTOTAL, CKMB, CKMBINDEX, TROPONINI in the last 168 hours. BNP (last 3 results) No results for input(s): PROBNP in the last 8760 hours. CBG: Recent Labs  Lab 09/02/21 2035  GLUCAP 106*   D-Dimer: No results for input(s): DDIMER in the last 72 hours. Hgb A1c: No results for input(s): HGBA1C in the last 72 hours. Lipid Profile: No results for input(s): CHOL, HDL, LDLCALC, TRIG, CHOLHDL, LDLDIRECT in the last 72 hours. Thyroid function studies: No results for  input(s): TSH, T4TOTAL, T3FREE, THYROIDAB in the last 72 hours.  Invalid input(s): FREET3  Anemia work up: No results for input(s): VITAMINB12, FOLATE, FERRITIN, TIBC, IRON, RETICCTPCT in  the last 72 hours.  Sepsis Labs: Recent Labs  Lab 08/30/21 2007 08/31/21 8657 09/01/21 0504 09/02/21 0625  WBC 6.3 4.7 5.4 4.6    Microbiology Recent Results (from the past 240 hour(s))  Resp Panel by RT-PCR (Flu A&B, Covid) Nasopharyngeal Swab     Status: None   Collection Time: 08/30/21 11:25 PM   Specimen: Nasopharyngeal Swab; Nasopharyngeal(NP) swabs in vial transport medium  Result Value Ref Range Status   SARS Coronavirus 2 by RT PCR NEGATIVE NEGATIVE Final    Comment: (NOTE) SARS-CoV-2 target nucleic acids are NOT DETECTED.  The SARS-CoV-2 RNA is generally detectable in upper respiratory specimens during the acute phase of infection. The lowest concentration of SARS-CoV-2 viral copies this assay can detect is 138 copies/mL. A negative result does not preclude SARS-Cov-2 infection and should not be used as the sole basis for treatment or other patient management decisions. A negative result may occur with  improper specimen collection/handling, submission of specimen other than nasopharyngeal swab, presence of viral mutation(s) within the areas targeted by this assay, and inadequate number of viral copies(<138 copies/mL). A negative result must be combined with clinical observations, patient history, and epidemiological information. The expected result is Negative.  Fact Sheet for Patients:  BloggerCourse.com  Fact Sheet for Healthcare Providers:  SeriousBroker.it  This test is no t yet approved or cleared by the [place] FDA and  has been authorized for detection and/or diagnosis of SARS-CoV-2 by FDA under an Emergency Use Authorization (EUA). This EUA will remain  in effect (meaning this test can be used) for the duration  of the COVID-19 declaration under Section 564(b)(1) of the Act, 21 U.S.C.section 360bbb-3(b)(1), unless the authorization is terminated  or revoked sooner.       Influenza A by PCR NEGATIVE NEGATIVE Final   Influenza B by PCR NEGATIVE NEGATIVE Final    Comment: (NOTE) The Xpert Xpress SARS-CoV-2/FLU/RSV plus assay is intended as an aid in the diagnosis of influenza from Nasopharyngeal swab specimens and should not be used as a sole basis for treatment. Nasal washings and aspirates are unacceptable for Xpert Xpress SARS-CoV-2/FLU/RSV testing.  Fact Sheet for Patients: BloggerCourse.com  Fact Sheet for Healthcare Providers: SeriousBroker.it  This test is not yet approved or cleared by the [place] FDA and has been authorized for detection and/or diagnosis of SARS-CoV-2 by FDA under an Emergency Use Authorization (EUA). This EUA will remain in effect (meaning this test can be used) for the duration of the COVID-19 declaration under Section 564(b)(1) of the Act, 21 U.S.C. section 360bbb-3(b)(1), unless the authorization is terminated or revoked.  Performed at [facility], [address] [place], [place] [address]     Procedures and diagnostic studies:  No results found.             LOS: 3 days   Mitali Shenefield  [facility]   Pager on [email or web address]. If 7PM-7AM, please contact night-coverage at [email or web address]     09/03/2021, 1:23 PM

## 2021-09-03 NOTE — Discharge Instructions (Signed)
  Or patient can transfer prescription to a [facility] and it should cost around $9.00

## 2021-09-03 NOTE — Progress Notes (Signed)
Physical Therapy Treatment Patient Details Name: Jamie Wilkins MRN: [id number] DOB: Dec 02, 1960 Today's Date: 09/03/2021   History of Present Illness 60 y.o. female who presents to ED following a DSS welfare check; pt and her neighbor called for [employer] Whe[employer] and EMS ultimately brought pt to hospital. Medical history significant for paroxysmal atrial fibrillation not on anticoagulation due to history of GI bleed, alcoholic cirrhosis, anemia.    PT Comments    Pt received in Semi-Fowler's position and agreeable to therapy.  Pt states she is "willing to do whatever it takes to get back on her feet" upon arrival.  Pt then regressed to not wanting to attempt to stand upright noting she needed a cigarette before she could stand or walk.  Pt did attempt to stand x5, with the final two attempts being successful yet requiring maxA from therapist.  Pt still very fearful of falling at this time.  Pt then cleaned up as she had a bowel movement while in standing, and then placed back in bed with all needs met.  Current discharge plans to SNF remain appropriate at this time.  Pt will continue to benefit from skilled therapy in order to address deficits listed below.     Recommendations for follow up therapy are one component of a multi-disciplinary discharge planning process, led by the attending physician.  Recommendations may be updated based on patient status, additional functional criteria and insurance authorization.  Follow Up Recommendations  SNF;Supervision/Assistance - 24 hour     Equipment Recommendations       Recommendations for Other Services       Precautions / Restrictions Precautions Precautions: Fall Precaution Comments: monitor HR Restrictions Weight Bearing Restrictions: No     Mobility  Bed Mobility Overal bed mobility: Needs Assistance Bed Mobility: Supine to Sit;Sit to Supine     Supine to sit: Mod assist Sit to supine: Mod assist   General bed mobility  comments: assistance for trunk support and to bring hips towards EOB    Transfers Overall transfer level: Needs assistance Equipment used: Rolling walker (2 wheeled) Transfers: Sit to/from Stand Sit to Stand: Max assist         General transfer comment: Pt needing increased cuing and manual facilitation for anterior weight shift  Ambulation/Gait             General Gait Details: Pt refused to attempt ambulation due to feeling like she was going to fall.  Even with maximal encouragement, pt declined ambulation.   Stairs             Wheelchair Mobility    Modified Rankin (Stroke Patients Only)       Balance Overall balance assessment: Needs assistance Sitting-balance support: Bilateral upper extremity supported Sitting balance-Leahy Scale: Fair Sitting balance - Comments: supervision for safety on EOB   Standing balance support: Bilateral upper extremity supported Standing balance-Leahy Scale: Poor Standing balance comment: pt very anxious about standing.  Pt requires consistent guarding to prevent pt from uncontrolled descent.                            Cognition Arousal/Alertness: Awake/alert Behavior During Therapy: WFL for tasks assessed/performed Overall Cognitive Status: Within Functional Limits for tasks assessed                                 General Comments: Pt needing increased motivation  in order to perform mobility.      Exercises      General Comments        Pertinent Vitals/Pain Pain Assessment: No/denies pain    Home Living                      Prior Function            PT Goals (current goals can now be found in the care plan section) Acute Rehab PT Goals Patient Stated Goal: to go to rehab Progress towards PT goals: Progressing toward goals    Frequency    Min 2X/week      PT Plan Current plan remains appropriate    Co-evaluation              AM-PAC PT "6 Clicks"  Mobility   Outcome Measure  Help needed turning from your back to your side while in a flat bed without using bedrails?: A Little Help needed moving from lying on your back to sitting on the side of a flat bed without using bedrails?: A Lot Help needed moving to and from a bed to a chair (including a wheelchair)?: A Lot Help needed standing up from a chair using your arms (e.g., wheelchair or bedside chair)?: A Lot Help needed to walk in hospital room?: Total Help needed climbing 3-5 steps with a railing? : Total 6 Click Score: 11    End of Session Equipment Utilized During Treatment: Gait belt Activity Tolerance: Patient limited by fatigue Patient left: in bed;with call bell/phone within reach;with bed alarm set Nurse Communication: Mobility status PT Visit Diagnosis: Muscle weakness (generalized) (M62.81);Adult, failure to thrive (R62.7)     Time: 7158-0638 PT Time Calculation (min) (ACUTE ONLY): 47 min  Charges:  $Therapeutic Exercise: 8-22 mins $Therapeutic Activity: 23-37 mins                     Gwenlyn Saran, PT, DPT 09/03/21, 5:10 PM    Jamie Wilkins 09/03/2021, 5:07 PM

## 2021-09-04 DIAGNOSIS — I959 Hypotension, unspecified: Secondary | ICD-10-CM

## 2021-09-04 LAB — URINE DRUG SCREEN, QUALITATIVE (ARMC ONLY)
Amphetamines, Ur Screen: NOT DETECTED
Barbiturates, Ur Screen: NOT DETECTED
Benzodiazepine, Ur Scrn: NOT DETECTED
Cannabinoid 50 Ng, Ur ~~LOC~~: NOT DETECTED
Cocaine Metabolite,Ur ~~LOC~~: NOT DETECTED
MDMA (Ecstasy)Ur Screen: NOT DETECTED
Methadone Scn, Ur: NOT DETECTED
Opiate, Ur Screen: NOT DETECTED
Phencyclidine (PCP) Ur S: NOT DETECTED
Tricyclic, Ur Screen: NOT DETECTED

## 2021-09-04 LAB — URINALYSIS, COMPLETE (UACMP) WITH MICROSCOPIC
Bilirubin Urine: NEGATIVE
Glucose, UA: NEGATIVE mg/dL
Ketones, ur: NEGATIVE mg/dL
Nitrite: NEGATIVE
Protein, ur: NEGATIVE mg/dL
Specific Gravity, Urine: 1.004 — ABNORMAL LOW (ref 1.005–1.030)
pH: 6 (ref 5.0–8.0)

## 2021-09-04 MED ORDER — POTASSIUM CHLORIDE CRYS ER 20 MEQ PO TBCR
40.0000 meq | EXTENDED_RELEASE_TABLET | Freq: Once | ORAL | Status: AC
  Administered 2021-09-04: 17:00:00 40 meq via ORAL
  Filled 2021-09-04: qty 2

## 2021-09-04 NOTE — Progress Notes (Signed)
Progress Note    Jamie Wilkins  WPV:[id number] DOB: November 16, 1961  DOA: 08/30/2021 PCP: [facility]      Brief Narrative:    Medical records reviewed and are as summarized below:  Jamie Wilkins is a 60 y.o. female with medical history significant for paroxysmal atrial fibrillation not on anticoagulation due to history of GI bleed, alcoholic liver cirrhosis, chronic anemia.  She was brought to the hospital at the recommendation of DSS after she was found sitting at home in her feces.  She complained of generalized weakness and poor oral intake.  She felt so weak that she was unable to get up from her chair.       Assessment/Plan:   Principal Problem:   Severe major depression, single episode, without psychotic features (HCC) Active Problems:   Hypocalcemia   Hypokalemia   Hypomagnesemia   Alcoholic hepatitis without ascites   AF (paroxysmal atrial fibrillation) (HCC)   Hypotension   Depression due to physical illness   Decubitus ulcer   Body mass index is 22.11 kg/m.   Acute on chronic hypotension, dehydration: BP is better.  Continue midodrine.  Paroxysmal atrial fibrillation with RVR: Heart rate has improved.  Avoid rate control for hospital because of hypotension.   Depression with suicidal ideation: She is not suicidal nor homicidal.  Continue mirtazapine.  IVC has been revoked and psychiatrist has signed off.  Alcohol use disorder: Counseled to quit using alcohol.  Continue thiamine and multivitamins.  She is also on CIWA protocol.  Anemia of chronic disease, folic acid deficiency: No indication for blood transfusion at this time.  Continue folic acid.  Hypokalemia, hypomagnesemia, hypocalcemia and hypophosphatemia: Improved  Vitamin D deficiency: Vitamin D level is 20.3.  Continue vitamin D supplement  Alcoholic liver cirrhosis: Compensated  Right buttock deep tissue injury (present on admission): Continue local wound  care.   Diet Order             Diet regular Room service appropriate? Yes; Fluid consistency: Thin  Diet effective now                      Consultants: Psychiatrist  Procedures: None    Medications:    cholecalciferol  1,000 Units Oral Daily   enoxaparin (LOVENOX) injection  40 mg Subcutaneous Q24H   folic acid  1 mg Oral Daily   midodrine  5 mg Oral TID WC   mirtazapine  30 mg Oral QHS   multivitamin with minerals  1 tablet Oral Daily   nicotine  14 mg Transdermal Daily   polyethylene glycol  17 g Oral Daily   thiamine  100 mg Oral Daily   Or   thiamine  100 mg Intravenous Daily   Continuous Infusions:     Anti-infectives (From admission, onward)    None              Family Communication/Anticipated D/C date and plan/Code Status   DVT prophylaxis: enoxaparin (LOVENOX) injection 40 mg Start: 08/30/21 2330     Code Status: Full Code  Family Communication: None Disposition Plan:    Status is: Inpatient  Remains inpatient appropriate because:Unsafe d/c plan and Inpatient level of care appropriate due to severity of illness  Dispo: The patient is from: Home              Anticipated d/c is to: SNF              Patient  currently is medically stable to d/c.   Difficult to place patient Yes           Subjective:   Interval events noted.  She has no complaints.  No abdominal pain, shortness of breath or chest pain.  She is moving her bowels.  Objective:    Vitals:   09/04/21 0030 09/04/21 0522 09/04/21 0732 09/04/21 1122  BP: 104/71 105/80 94/72 95/76   Pulse: 81 60 90 100  Resp: 16 16 18 18   Temp: 98.1 F (36.7 C) 98 F (36.7 C) 97.7 F (36.5 C) 97.8 F (36.6 C)  TempSrc:   Oral Oral  SpO2: 99% 95% 97% 90%  Weight:      Height:       No data found.   Intake/Output Summary (Last 24 hours) at 09/04/2021 1330 Last data filed at 09/04/2021 0511 Gross per 24 hour  Intake 240 ml  Output 150 ml  Net 90 ml   Filed  Weights   08/30/21 1936  Weight: 62.1 kg    Exam:  GEN: NAD SKIN: Deep tissue injury on right buttock EYES: EOMI ENT: MMM CV: RRR PULM: CTA B ABD: soft, ND, NT, +BS CNS: AAO x 3, non focal EXT: No edema or tenderness       Data Reviewed:   I have personally reviewed following labs and imaging studies:  Labs: Labs show the following:   Basic Metabolic Panel: Recent Labs  Lab 08/30/21 2007 08/30/21 2051 08/31/21 0621 09/01/21 0504 09/02/21 0625  NA 135  --  137 135 137  K 2.5*  --  2.8* 3.6 3.6  CL 94*  --  102 101 104  CO2 27  --  28 27 28   GLUCOSE 85  --  98 104* 81  BUN 11  --  11 10 8   CREATININE 0.77  --  0.72 0.68 0.67  CALCIUM 7.7*  --  6.9* 7.5* 7.8*  MG  --  1.3*  --  2.3 2.1  PHOS  --  2.2*  --  1.9* 2.6   GFR Estimated Creatinine Clearance: 69.1 mL/min (by C-G formula based on SCr of 0.67 mg/dL). Liver Function Tests: Recent Labs  Lab 08/30/21 2051 09/01/21 0504  AST 145* 97*  ALT 48* 39  ALKPHOS 278* 233*  BILITOT 1.7* 1.1  PROT 5.7* 4.9*  ALBUMIN 2.0* 1.7*   No results for input(s): LIPASE, AMYLASE in the last 168 hours. Recent Labs  Lab 08/30/21 2051  AMMONIA 33   Coagulation profile Recent Labs  Lab 08/30/21 2051  INR 1.1    CBC: Recent Labs  Lab 08/30/21 2007 08/31/21 2052 09/01/21 0504 09/02/21 0625  WBC 6.3 4.7 5.4 4.6  NEUTROABS  --   --  2.8 1.8  HGB 8.6* 7.0* 7.5* 7.7*  HCT 25.3* 20.5* 22.6* 24.3*  MCV 94.4 93.2 92.6 96.0  PLT 157 133* 156 170   Cardiac Enzymes: No results for input(s): CKTOTAL, CKMB, CKMBINDEX, TROPONINI in the last 168 hours. BNP (last 3 results) No results for input(s): PROBNP in the last 8760 hours. CBG: Recent Labs  Lab 09/02/21 2035  GLUCAP 106*   D-Dimer: No results for input(s): DDIMER in the last 72 hours. Hgb A1c: No results for input(s): HGBA1C in the last 72 hours. Lipid Profile: No results for input(s): CHOL, HDL, LDLCALC, TRIG, CHOLHDL, LDLDIRECT in the last 72  hours. Thyroid function studies: No results for input(s): TSH, T4TOTAL, T3FREE, THYROIDAB in the last 72 hours.  Invalid input(s): FREET3  Anemia work up: No results for input(s): VITAMINB12, FOLATE, FERRITIN, TIBC, IRON, RETICCTPCT in the last 72 hours.  Sepsis Labs: Recent Labs  Lab 08/30/21 2007 08/31/21 5366 09/01/21 0504 09/02/21 0625  WBC 6.3 4.7 5.4 4.6    Microbiology Recent Results (from the past 240 hour(s))  Resp Panel by RT-PCR (Flu A&B, Covid) Nasopharyngeal Swab     Status: None   Collection Time: 08/30/21 11:25 PM   Specimen: Nasopharyngeal Swab; Nasopharyngeal(NP) swabs in vial transport medium  Result Value Ref Range Status   SARS Coronavirus 2 by RT PCR NEGATIVE NEGATIVE Final    Comment: (NOTE) SARS-CoV-2 target nucleic acids are NOT DETECTED.  The SARS-CoV-2 RNA is generally detectable in upper respiratory specimens during the acute phase of infection. The lowest concentration of SARS-CoV-2 viral copies this assay can detect is 138 copies/mL. A negative result does not preclude SARS-Cov-2 infection and should not be used as the sole basis for treatment or other patient management decisions. A negative result may occur with  improper specimen collection/handling, submission of specimen other than nasopharyngeal swab, presence of viral mutation(s) within the areas targeted by this assay, and inadequate number of viral copies(<138 copies/mL). A negative result must be combined with clinical observations, patient history, and epidemiological information. The expected result is Negative.  Fact Sheet for Patients:  BloggerCourse.com  Fact Sheet for Healthcare Providers:  SeriousBroker.it  This test is no t yet approved or cleared by the [place] FDA and  has been authorized for detection and/or diagnosis of SARS-CoV-2 by FDA under an Emergency Use Authorization (EUA). This EUA will remain  in effect  (meaning this test can be used) for the duration of the COVID-19 declaration under Section 564(b)(1) of the Act, 21 U.S.C.section 360bbb-3(b)(1), unless the authorization is terminated  or revoked sooner.       Influenza A by PCR NEGATIVE NEGATIVE Final   Influenza B by PCR NEGATIVE NEGATIVE Final    Comment: (NOTE) The Xpert Xpress SARS-CoV-2/FLU/RSV plus assay is intended as an aid in the diagnosis of influenza from Nasopharyngeal swab specimens and should not be used as a sole basis for treatment. Nasal washings and aspirates are unacceptable for Xpert Xpress SARS-CoV-2/FLU/RSV testing.  Fact Sheet for Patients: BloggerCourse.com  Fact Sheet for Healthcare Providers: SeriousBroker.it  This test is not yet approved or cleared by the [place] FDA and has been authorized for detection and/or diagnosis of SARS-CoV-2 by FDA under an Emergency Use Authorization (EUA). This EUA will remain in effect (meaning this test can be used) for the duration of the COVID-19 declaration under Section 564(b)(1) of the Act, 21 U.S.C. section 360bbb-3(b)(1), unless the authorization is terminated or revoked.  Performed at [facility], [address] [place], [place] [address]     Procedures and diagnostic studies:  No results found.             LOS: 4 days   Hailyn Zarr  [facility]   Pager on [email or web address]. If 7PM-7AM, please contact night-coverage at [email or web address]     09/04/2021, 1:30 PM

## 2021-09-05 MED ORDER — POTASSIUM CHLORIDE CRYS ER 20 MEQ PO TBCR
20.0000 meq | EXTENDED_RELEASE_TABLET | Freq: Once | ORAL | Status: AC
  Administered 2021-09-05: 17:00:00 20 meq via ORAL
  Filled 2021-09-05: qty 1

## 2021-09-05 MED ORDER — ALBUTEROL SULFATE HFA 108 (90 BASE) MCG/ACT IN AERS
1.0000 | INHALATION_SPRAY | Freq: Four times a day (QID) | RESPIRATORY_TRACT | Status: DC | PRN
  Filled 2021-09-05 (×2): qty 6.7

## 2021-09-05 MED ORDER — ALBUTEROL SULFATE (2.5 MG/3ML) 0.083% IN NEBU: 3.0000 mL | INHALATION_SOLUTION | Freq: Four times a day (QID) | RESPIRATORY_TRACT | Status: DC | PRN

## 2021-09-05 NOTE — Progress Notes (Signed)
Progress Note    Jamie Wilkins  TIR:[id number] DOB: 1961-06-19  DOA: 08/30/2021 PCP: [facility]      Brief Narrative:    Medical records reviewed and are as summarized below:  Jamie Wilkins is a 60 y.o. female with medical history significant for paroxysmal atrial fibrillation not on anticoagulation due to history of GI bleed, alcoholic liver cirrhosis, chronic anemia.  She was brought to the hospital at the recommendation of DSS after she was found sitting at home in her feces.  She complained of generalized weakness and poor oral intake.  She felt so weak that she was unable to get up from her chair.       Assessment/Plan:   Principal Problem:   Severe major depression, single episode, without psychotic features (HCC) Active Problems:   Hypocalcemia   Hypokalemia   Hypomagnesemia   Alcoholic hepatitis without ascites   AF (paroxysmal atrial fibrillation) (HCC)   Hypotension   Depression due to physical illness   Decubitus ulcer   Body mass index is 22.11 kg/m.   Acute on chronic hypotension, dehydration: BP stable.  Continue midodrine.  Paroxysmal atrial fibrillation with RVR: Heart rate has improved.  Avoid rate control for hospital because of hypotension.   Depression with suicidal ideation: She is not suicidal nor homicidal.  Continue mirtazapine.  IVC has been revoked and psychiatrist has signed off.  Alcohol use disorder: Counseled to quit using alcohol.  Continue thiamine and multivitamins.  She is also on CIWA protocol.  Anemia of chronic disease, folic acid deficiency: No indication for blood transfusion at this time.  Continue folic acid.  Hypokalemia, hypomagnesemia, hypocalcemia and hypophosphatemia: Improved  Vitamin D deficiency: Vitamin D level is 20.3.  Continue vitamin D supplement  Alcoholic liver cirrhosis: Compensated  Right buttock deep tissue injury (present on admission): Continue local wound  care.  Awaiting placement to SNF  Diet Order             Diet regular Room service appropriate? Yes; Fluid consistency: Thin  Diet effective now                      Consultants: Psychiatrist  Procedures: None    Medications:    cholecalciferol  1,000 Units Oral Daily   enoxaparin (LOVENOX) injection  40 mg Subcutaneous Q24H   folic acid  1 mg Oral Daily   midodrine  5 mg Oral TID WC   mirtazapine  30 mg Oral QHS   multivitamin with minerals  1 tablet Oral Daily   nicotine  14 mg Transdermal Daily   polyethylene glycol  17 g Oral Daily   thiamine  100 mg Oral Daily   Or   thiamine  100 mg Intravenous Daily   Continuous Infusions:     Anti-infectives (From admission, onward)    None              Family Communication/Anticipated D/C date and plan/Code Status   DVT prophylaxis: enoxaparin (LOVENOX) injection 40 mg Start: 08/30/21 2330     Code Status: Full Code  Family Communication: None Disposition Plan:    Status is: Inpatient  Remains inpatient appropriate because:Unsafe d/c plan and Inpatient level of care appropriate due to severity of illness  Dispo: The patient is from: Home              Anticipated d/c is to: SNF  Patient currently is medically stable to d/c.   Difficult to place patient Yes           Subjective:   No acute events overnight no dizziness, shortness of breath or chest pain.  Objective:    Vitals:   09/05/21 0548 09/05/21 0844 09/05/21 1118 09/05/21 1123  BP: 95/71 109/84 96/72   Pulse: 94 (!) 109 93   Resp: 16 18 16    Temp: 98.1 F (36.7 C) 98 F (36.7 C) 97.8 F (36.6 C)   TempSrc: Oral Oral Oral   SpO2: 93% 93% (!) 82% 98%  Weight:      Height:       No data found.   Intake/Output Summary (Last 24 hours) at 09/05/2021 1438 Last data filed at 09/05/2021 0508 Gross per 24 hour  Intake --  Output 600 ml  Net -600 ml   Filed Weights   08/30/21 1936  Weight: 62.1 kg     Exam:  GEN: NAD SKIN: Deep tissue injury on the right buttock EYES: No pallor or icterus ENT: MMM CV: RRR PULM: CTA B ABD: soft, ND, NT, +BS CNS: AAO x 3, non focal EXT: Edema of bilateral feet.  No erythema or tenderness.       Data Reviewed:   I have personally reviewed following labs and imaging studies:  Labs: Labs show the following:   Basic Metabolic Panel: Recent Labs  Lab 08/30/21 2007 08/30/21 2051 08/31/21 0621 09/01/21 0504 09/02/21 0625  NA 135  --  137 135 137  K 2.5*  --  2.8* 3.6 3.6  CL 94*  --  102 101 104  CO2 27  --  28 27 28   GLUCOSE 85  --  98 104* 81  BUN 11  --  11 10 8   CREATININE 0.77  --  0.72 0.68 0.67  CALCIUM 7.7*  --  6.9* 7.5* 7.8*  MG  --  1.3*  --  2.3 2.1  PHOS  --  2.2*  --  1.9* 2.6   GFR Estimated Creatinine Clearance: 69.1 mL/min (by C-G formula based on SCr of 0.67 mg/dL). Liver Function Tests: Recent Labs  Lab 08/30/21 2051 09/01/21 0504  AST 145* 97*  ALT 48* 39  ALKPHOS 278* 233*  BILITOT 1.7* 1.1  PROT 5.7* 4.9*  ALBUMIN 2.0* 1.7*   No results for input(s): LIPASE, AMYLASE in the last 168 hours. Recent Labs  Lab 08/30/21 2051  AMMONIA 33   Coagulation profile Recent Labs  Lab 08/30/21 2051  INR 1.1    CBC: Recent Labs  Lab 08/30/21 2007 08/31/21 2052 09/01/21 0504 09/02/21 0625  WBC 6.3 4.7 5.4 4.6  NEUTROABS  --   --  2.8 1.8  HGB 8.6* 7.0* 7.5* 7.7*  HCT 25.3* 20.5* 22.6* 24.3*  MCV 94.4 93.2 92.6 96.0  PLT 157 133* 156 170   Cardiac Enzymes: No results for input(s): CKTOTAL, CKMB, CKMBINDEX, TROPONINI in the last 168 hours. BNP (last 3 results) No results for input(s): PROBNP in the last 8760 hours. CBG: Recent Labs  Lab 09/02/21 2035  GLUCAP 106*   D-Dimer: No results for input(s): DDIMER in the last 72 hours. Hgb A1c: No results for input(s): HGBA1C in the last 72 hours. Lipid Profile: No results for input(s): CHOL, HDL, LDLCALC, TRIG, CHOLHDL, LDLDIRECT in the last  72 hours. Thyroid function studies: No results for input(s): TSH, T4TOTAL, T3FREE, THYROIDAB in the last 72 hours.  Invalid input(s): FREET3  Anemia work up:  No results for input(s): VITAMINB12, FOLATE, FERRITIN, TIBC, IRON, RETICCTPCT in the last 72 hours.  Sepsis Labs: Recent Labs  Lab 08/30/21 2007 08/31/21 6962 09/01/21 0504 09/02/21 0625  WBC 6.3 4.7 5.4 4.6    Microbiology Recent Results (from the past 240 hour(s))  Resp Panel by RT-PCR (Flu A&B, Covid) Nasopharyngeal Swab     Status: None   Collection Time: 08/30/21 11:25 PM   Specimen: Nasopharyngeal Swab; Nasopharyngeal(NP) swabs in vial transport medium  Result Value Ref Range Status   SARS Coronavirus 2 by RT PCR NEGATIVE NEGATIVE Final    Comment: (NOTE) SARS-CoV-2 target nucleic acids are NOT DETECTED.  The SARS-CoV-2 RNA is generally detectable in upper respiratory specimens during the acute phase of infection. The lowest concentration of SARS-CoV-2 viral copies this assay can detect is 138 copies/mL. A negative result does not preclude SARS-Cov-2 infection and should not be used as the sole basis for treatment or other patient management decisions. A negative result may occur with  improper specimen collection/handling, submission of specimen other than nasopharyngeal swab, presence of viral mutation(s) within the areas targeted by this assay, and inadequate number of viral copies(<138 copies/mL). A negative result must be combined with clinical observations, patient history, and epidemiological information. The expected result is Negative.  Fact Sheet for Patients:  BloggerCourse.com  Fact Sheet for Healthcare Providers:  SeriousBroker.it  This test is no t yet approved or cleared by the [place] FDA and  has been authorized for detection and/or diagnosis of SARS-CoV-2 by FDA under an Emergency Use Authorization (EUA). This EUA will remain  in  effect (meaning this test can be used) for the duration of the COVID-19 declaration under Section 564(b)(1) of the Act, 21 U.S.C.section 360bbb-3(b)(1), unless the authorization is terminated  or revoked sooner.       Influenza A by PCR NEGATIVE NEGATIVE Final   Influenza B by PCR NEGATIVE NEGATIVE Final    Comment: (NOTE) The Xpert Xpress SARS-CoV-2/FLU/RSV plus assay is intended as an aid in the diagnosis of influenza from Nasopharyngeal swab specimens and should not be used as a sole basis for treatment. Nasal washings and aspirates are unacceptable for Xpert Xpress SARS-CoV-2/FLU/RSV testing.  Fact Sheet for Patients: BloggerCourse.com  Fact Sheet for Healthcare Providers: SeriousBroker.it  This test is not yet approved or cleared by the [place] FDA and has been authorized for detection and/or diagnosis of SARS-CoV-2 by FDA under an Emergency Use Authorization (EUA). This EUA will remain in effect (meaning this test can be used) for the duration of the COVID-19 declaration under Section 564(b)(1) of the Act, 21 U.S.C. section 360bbb-3(b)(1), unless the authorization is terminated or revoked.  Performed at [facility], [address] [place], [place] [address]     Procedures and diagnostic studies:  No results found.             LOS: 5 days   Asuncion Tapscott  [facility]   Pager on [email or web address]. If 7PM-7AM, please contact night-coverage at [email or web address]     09/05/2021, 2:38 PM

## 2021-09-05 NOTE — Progress Notes (Signed)
Occupational Therapy Treatment Patient Details Name: Jamie Wilkins MRN: [id number] DOB: 01/08/1961 Today's Date: 09/05/2021   History of present illness 60 y.o. female who presents to ED following a DSS welfare check; pt and her neighbor called for Meals on Wheels and EMS ultimately brought pt to hospital. Medical history significant for paroxysmal atrial fibrillation not on anticoagulation due to history of GI bleed, alcoholic cirrhosis, anemia.   OT comments  Pt seen for OT treatment on this date. Upon arrival to room, pt awake and seated upright in bed with son present. Pt agreeable to OT tx, however required MOD encouragement to attempt OOB mobility this date. Pt currently presents with decreased strength and balance. Due to these impairments, pt requires MOD A for bed mobility, MAX A for seated LB dressing, and MAX A+2 for sit<>stand transfers. Pt able to perform x3 sit<>stand transfers this date, however unable to stand for >5sec with bilateral underarm support. Pt also engaged in bed-level LE therapy exercises this date (see below). Pt continues to benefit from skilled OT services to maximize return to PLOF and minimize risk of future falls, injury, caregiver burden, and readmission. Will continue to follow POC. Discharge recommendation remains appropriate.     Recommendations for follow up therapy are one component of a multi-disciplinary discharge planning process, led by the attending physician.  Recommendations may be updated based on patient status, additional functional criteria and insurance authorization.    Follow Up Recommendations  SNF;Supervision/Assistance - 24 hour    Equipment Recommendations  Other (comment) (defer to next venue of care)       Precautions / Restrictions Precautions Precautions: Fall Precaution Comments: monitor HR Restrictions Weight Bearing Restrictions: No       Mobility Bed Mobility Overal bed mobility: Needs Assistance Bed Mobility:  Supine to Sit;Sit to Supine     Supine to sit: Mod assist Sit to supine: Mod assist   General bed mobility comments: Requires assistance for trunk support and to bring hips towards EOB    Transfers Overall transfer level: Needs assistance Equipment used: Rolling walker (2 wheeled) Transfers: Sit to/from Stand Sit to Stand: Max assist;+2 safety/equipment         General transfer comment: Pt requesting +2 assistance to attempt STS transfers. Able to perform x3 sit>stand transfers via bilateral underarm support and MAX A    Balance Overall balance assessment: Needs assistance Sitting-balance support: Bilateral upper extremity supported Sitting balance-Leahy Scale: Fair Sitting balance - Comments: supervision for safety on EOB   Standing balance support: Bilateral upper extremity supported;During functional activity Standing balance-Leahy Scale: Poor Standing balance comment: pt very anxious about standing. Only able to stand for ~5 sec MAX before requesting to sit                           ADL either performed or assessed with clinical judgement   ADL Overall ADL's : Needs assistance/impaired                     Lower Body Dressing: Maximal assistance;Sitting/lateral leans Lower Body Dressing Details (indicate cue type and reason): to don/doff socks while seated EOB                     Cognition Arousal/Alertness: Awake/alert Behavior During Therapy: WFL for tasks assessed/performed Overall Cognitive Status: Within Functional Limits for tasks assessed  General Comments: Pt agreeable to OOB mobility following MOD encouragement. Requires increased processing time        Exercises General Exercises - Lower Extremity Ankle Circles/Pumps: AROM;10 reps;Both;Supine Heel Slides: AROM;5 reps;Both Hip ABduction/ADduction: AROM;Both;10 reps Hip Flexion/Marching: AROM;Both;10 reps;Seated            Pertinent Vitals/ Pain       Pain Assessment: No/denies pain         Frequency  Min 2X/week        Progress Toward Goals  OT Goals(current goals can now be found in the care plan section)  Progress towards OT goals: Progressing toward goals  Acute Rehab OT Goals Patient Stated Goal: to walk again OT Goal Formulation: With patient Time For Goal Achievement: 09/15/21 Potential to Achieve Goals: Good  Plan Discharge plan remains appropriate;Frequency remains appropriate       AM-PAC OT "6 Clicks" Daily Activity     Outcome Measure   Help from another person eating meals?: None Help from another person taking care of personal grooming?: A Little Help from another person toileting, which includes using toliet, bedpan, or urinal?: A Lot Help from another person bathing (including washing, rinsing, drying)?: A Lot Help from another person to put on and taking off regular upper body clothing?: A Little Help from another person to put on and taking off regular lower body clothing?: A Lot 6 Click Score: 16    End of Session Equipment Utilized During Treatment: Gait belt  OT Visit Diagnosis: Unsteadiness on feet (R26.81);Muscle weakness (generalized) (M62.81)   Activity Tolerance Patient tolerated treatment well   Patient Left in bed;with call bell/phone within reach;with bed alarm set;with family/visitor present   Nurse Communication Mobility status        Time: 8295-6213 OT Time Calculation (min): 34 min  Charges: OT General Charges $OT Visit: 1 Visit OT Treatments $Self Care/Home Management : 8-22 mins $Therapeutic Activity: 8-22 mins  Matthew Folks, OTR/L [facility] [phone]

## 2021-09-05 NOTE — Consult Note (Signed)
[facility] Face-to-Face Psychiatry Consult   Reason for Consult: Consult 60 year old woman with depression follow-up Referring Physician:  Myriam Forehand Patient Identification: Wilkins Wilkins MRN:  [id number] Principal Diagnosis: Severe major depression, single episode, without psychotic features (HCC) Diagnosis:  Principal Problem:   Severe major depression, single episode, without psychotic features (HCC) Active Problems:   Hypocalcemia   Hypokalemia   Hypomagnesemia   Alcoholic hepatitis without ascites   AF (paroxysmal atrial fibrillation) (HCC)   Hypotension   Depression due to physical illness   Decubitus ulcer   Total Time spent with patient: 30 minutes  Subjective:   Wilkins Wilkins is a 60 y.o. female patient admitted with "I am feeling pretty good".  HPI: Patient seen for follow-up.  Sitting in bed awake.  Grooming better.  Made okay eye contact.  Mood is stated as good.  Denies feeling depressed.  Denies any suicidal thought at all.  Denies being aware of hallucinations.  Denies any side effects from medication.  Patient says she does not think she has ever been out of bed since she has been here although she says somebody had charted that she had been up yesterday.  May be still having some memory issues.  Patient says she is feeling well to the point that she is hoping for discharge soon  Past Psychiatric History: Past history of recurrent depression  Risk to Self:   Risk to Others:   Prior Inpatient Therapy:   Prior Outpatient Therapy:    Past Medical History:  Past Medical History:  Diagnosis Date   Asthma    Heavy menstrual period    Hypertension     Past Surgical History:  Procedure Laterality Date   ABDOMINAL HYSTERECTOMY     partial   COLONOSCOPY N/A 06/12/2021   Procedure: COLONOSCOPY;  Surgeon: Toney Reil, MD;  Location: [facility] ENDOSCOPY;  Service: Gastroenterology;  Laterality: N/A;   ESOPHAGOGASTRODUODENOSCOPY N/A 06/10/2021   Procedure:  ESOPHAGOGASTRODUODENOSCOPY (EGD);  Surgeon: Toledo, Boykin Nearing, MD;  Location: [facility] ENDOSCOPY;  Service: Gastroenterology;  Laterality: N/A;   FOOT SURGERY     GIVENS CAPSULE STUDY N/A 06/12/2021   Procedure: GIVENS CAPSULE STUDY;  Surgeon: Toney Reil, MD;  Location: [facility] ENDOSCOPY;  Service: Gastroenterology;  Laterality: N/A;   S/P BTL     Family History:  Family History  Problem Relation Age of Onset   CAD Father    CAD Brother    CAD Brother    Family Psychiatric  History: None Social History:  Social History   Substance and Sexual Activity  Alcohol Use Yes   Alcohol/week: 7.0 standard drinks   Types: 7 Standard drinks or equivalent per week     Social History   Substance and Sexual Activity  Drug Use Never    Social History   Socioeconomic History   Marital status: Divorced    Spouse name: Not on file   Number of children: Not on file   Years of education: Not on file   Highest education level: Not on file  Occupational History   Not on file  Tobacco Use   Smoking status: Every Day    Packs/day: 0.50    Types: Cigarettes   Smokeless tobacco: Never  Vaping Use   Vaping Use: Never used  Substance and Sexual Activity   Alcohol use: Yes    Alcohol/week: 7.0 standard drinks    Types: 7 Standard drinks or equivalent per week   Drug use: Never   Sexual activity: Not on  file  Other Topics Concern   Not on file  Social History Narrative   Not on file   Social Determinants of Health   Financial Resource Strain: Not on file  Food Insecurity: Not on file  Transportation Needs: Not on file  Physical Activity: Not on file  Stress: Not on file  Social Connections: Not on file   Additional Social History:    Allergies:  No Known Allergies  Labs:  Results for orders placed or performed during the hospital encounter of 08/30/21 (from the past 48 hour(s))  Urinalysis, Complete w Microscopic     Status: Abnormal   Collection Time: 09/04/21  5:07 AM   Result Value Ref Range   Color, Urine YELLOW (A) YELLOW   APPearance HAZY (A) CLEAR   Specific Gravity, Urine 1.004 (L) 1.005 - 1.030   pH 6.0 5.0 - 8.0   Glucose, UA NEGATIVE NEGATIVE mg/dL   Hgb urine dipstick SMALL (A) NEGATIVE   Bilirubin Urine NEGATIVE NEGATIVE   Ketones, ur NEGATIVE NEGATIVE mg/dL   Protein, ur NEGATIVE NEGATIVE mg/dL   Nitrite NEGATIVE NEGATIVE   Leukocytes,Ua LARGE (A) NEGATIVE   RBC / HPF 11-20 0 - 5 RBC/hpf   WBC, UA 21-50 0 - 5 WBC/hpf   Bacteria, UA RARE (A) NONE SEEN   Squamous Epithelial / LPF 0-5 0 - 5    Comment: Performed at [facility], [address] [place], [place] [address]  Urine Drug Screen, Qualitative ([facility] only)     Status: None   Collection Time: 09/04/21  5:07 AM  Result Value Ref Range   Tricyclic, Ur Screen NONE DETECTED NONE DETECTED   Amphetamines, Ur Screen NONE DETECTED NONE DETECTED   MDMA (Ecstasy)Ur Screen NONE DETECTED NONE DETECTED   Cocaine Metabolite,Ur [place] NONE DETECTED NONE DETECTED   Opiate, Ur Screen NONE DETECTED NONE DETECTED   Phencyclidine (PCP) Ur S NONE DETECTED NONE DETECTED   Cannabinoid 50 Ng, Ur [place] NONE DETECTED NONE DETECTED   Barbiturates, Ur Screen NONE DETECTED NONE DETECTED   Benzodiazepine, Ur Scrn NONE DETECTED NONE DETECTED   Methadone Scn, Ur NONE DETECTED NONE DETECTED    Comment: (NOTE) Tricyclics + metabolites, urine    Cutoff 1000 ng/mL Amphetamines + metabolites, urine  Cutoff 1000 ng/mL MDMA (Ecstasy), urine              Cutoff 500 ng/mL Cocaine Metabolite, urine          Cutoff 300 ng/mL Opiate + metabolites, urine        Cutoff 300 ng/mL Phencyclidine (PCP), urine         Cutoff 25 ng/mL Cannabinoid, urine                 Cutoff 50 ng/mL Barbiturates + metabolites, urine  Cutoff 200 ng/mL Benzodiazepine, urine              Cutoff 200 ng/mL Methadone, urine                   Cutoff 300 ng/mL  The urine drug screen provides only a preliminary, unconfirmed analytical test  result and should not be used for non-medical purposes. Clinical consideration and professional judgment should be applied to any positive drug screen result due to possible interfering substances. A more specific alternate chemical method must be used in order to obtain a confirmed analytical result. Gas chromatography / mass spectrometry (GC/MS) is the preferred confirm atory method. Performed at [facility], [address] [place], [place] [address]     Current Facility-Administered Medications  Medication Dose Route Frequency Provider Last Rate Last Admin   albuterol (PROVENTIL) (2.5 MG/3ML) 0.083% nebulizer solution 3 mL  3 mL Nebulization Q6H PRN Lurene Shadow, MD       alum & mag hydroxide-simeth (MAALOX/MYLANTA) 200-200-20 MG/5ML suspension 30 mL  30 mL Oral Q4H PRN Lurene Shadow, MD   30 mL at 08/31/21 2139   cholecalciferol (VITAMIN D3) tablet 1,000 Units  1,000 Units Oral Daily Lurene Shadow, MD   1,000 Units at 09/05/21 0936   enoxaparin (LOVENOX) injection 40 mg  40 mg Subcutaneous Q24H Lurene Shadow, MD   40 mg at 09/04/21 2112   folic acid (FOLVITE) tablet 1 mg  1 mg Oral Daily Lurene Shadow, MD   1 mg at 09/05/21 1884   hydrocortisone cream 1 %   Topical PRN Lurene Shadow, MD       midodrine (PROAMATINE) tablet 5 mg  5 mg Oral TID WC Lurene Shadow, MD   5 mg at 09/05/21 1352   mirtazapine (REMERON) tablet 30 mg  30 mg Oral QHS Lurene Shadow, MD   30 mg at 09/04/21 2108   multivitamin with minerals tablet 1 tablet  1 tablet Oral Daily Lurene Shadow, MD   1 tablet at 09/05/21 1660   nicotine (NICODERM CQ - dosed in mg/24 hours) patch 14 mg  14 mg Transdermal Daily Lurene Shadow, MD   14 mg at 09/05/21 0940   polyethylene glycol (MIRALAX / GLYCOLAX) packet 17 g  17 g Oral Daily Lurene Shadow, MD   17 g at 09/04/21 0901   potassium chloride SA (KLOR-CON) CR tablet 20 mEq  20 mEq Oral Once Lurene Shadow, MD       thiamine tablet 100 mg  100 mg Oral Daily  Lurene Shadow, MD   100 mg at 09/05/21 6301   Or   thiamine (B-1) injection 100 mg  100 mg Intravenous Daily Lurene Shadow, MD       Zinc Oxide (TRIPLE PASTE) 12.8 % ointment   Topical PRN Lurene Shadow, MD        Musculoskeletal: Strength & Muscle Tone: decreased Gait & Station: unsteady Patient leans: N/A            Psychiatric Specialty Exam:  Presentation  General Appearance: Appropriate for Environment  Eye Contact:Good  Speech:Clear and Coherent  Speech Volume:Normal  Handedness:Right   Mood and Affect  Mood:Depressed  Affect:Congruent; Blunt; Depressed   Thought Process  Thought Processes:Coherent  Descriptions of Associations:Intact  Orientation:Full (Time, Place and Person)  Thought Content:Logical  History of Schizophrenia/Schizoaffective disorder:No  Duration of Psychotic Symptoms:No data recorded Hallucinations:No data recorded Ideas of Reference:None  Suicidal Thoughts:No data recorded Homicidal Thoughts:No data recorded  Sensorium  Memory:Immediate Good; Recent Good; Remote Good  Judgment:Poor  Insight:Poor   Executive Functions  Concentration:Fair  Attention Span:Fair  Recall:Fair  Fund of Knowledge:Fair  Language:Fair   Psychomotor Activity  Psychomotor Activity: No data recorded  Assets  Assets:Communication Skills; Desire for Improvement; Physical Health; Resilience; Social Support   Sleep  Sleep: No data recorded  Physical Exam: Physical Exam Vitals and nursing note reviewed.  Constitutional:      Appearance: Normal appearance.  HENT:     Head: Normocephalic and atraumatic.     Mouth/Throat:     Pharynx: Oropharynx is clear.  Eyes:     Pupils: Pupils are equal, round, and reactive to light.  Cardiovascular:     Rate  and Rhythm: Normal rate and regular rhythm.  Pulmonary:     Effort: Pulmonary effort is normal.     Breath sounds: Normal breath sounds.  Abdominal:     General: Abdomen is  flat.     Palpations: Abdomen is soft.  Musculoskeletal:        General: Normal range of motion.  Skin:    General: Skin is warm and dry.  Neurological:     General: No focal deficit present.     Mental Status: She is alert. Mental status is at baseline.  Psychiatric:        Mood and Affect: Mood normal.        Thought Content: Thought content normal.   Review of Systems  Constitutional: Negative.   HENT: Negative.    Eyes: Negative.   Respiratory: Negative.    Cardiovascular: Negative.   Gastrointestinal: Negative.   Musculoskeletal: Negative.   Skin: Negative.   Neurological: Negative.   Psychiatric/Behavioral: Negative.    Blood pressure 96/72, pulse 93, temperature 97.8 F (36.6 C), temperature source Oral, resp. rate 16, height 5\' 6"  (1.676 m), weight 62.1 kg, last menstrual period 05/11/2010, SpO2 98 %. Body mass index is 22.11 kg/m.  Treatment Plan Summary: Plan no change to current mirtazapine.  Supportive counseling and therapy and review of overall goals.  Encourage patient to work on improving her strength and make sure she is eating well.  Disposition: No evidence of imminent risk to self or others at present.   Patient does not meet criteria for psychiatric inpatient admission. Supportive therapy provided about ongoing stressors. Discussed crisis plan, support from social network, calling 911, coming to the Emergency Department, and calling Suicide Hotline.  05/13/2010, MD 09/05/2021 3:17 PM

## 2021-09-06 LAB — CBC WITH DIFFERENTIAL/PLATELET
Abs Immature Granulocytes: 0.02 10*3/uL (ref 0.00–0.07)
Basophils Absolute: 0 10*3/uL (ref 0.0–0.1)
Basophils Relative: 1 %
Eosinophils Absolute: 0.1 10*3/uL (ref 0.0–0.5)
Eosinophils Relative: 1 %
HCT: 24.3 % — ABNORMAL LOW (ref 36.0–46.0)
Hemoglobin: 7.9 g/dL — ABNORMAL LOW (ref 12.0–15.0)
Immature Granulocytes: 0 %
Lymphocytes Relative: 32 %
Lymphs Abs: 1.8 10*3/uL (ref 0.7–4.0)
MCH: 32.4 pg (ref 26.0–34.0)
MCHC: 32.5 g/dL (ref 30.0–36.0)
MCV: 99.6 fL (ref 80.0–100.0)
Monocytes Absolute: 0.8 10*3/uL (ref 0.1–1.0)
Monocytes Relative: 14 %
Neutro Abs: 2.9 10*3/uL (ref 1.7–7.7)
Neutrophils Relative %: 52 %
Platelets: 262 10*3/uL (ref 150–400)
RBC: 2.44 MIL/uL — ABNORMAL LOW (ref 3.87–5.11)
RDW: 21.4 % — ABNORMAL HIGH (ref 11.5–15.5)
Smear Review: NORMAL
WBC: 5.5 10*3/uL (ref 4.0–10.5)
nRBC: 0 % (ref 0.0–0.2)

## 2021-09-06 LAB — BASIC METABOLIC PANEL
Anion gap: 5 (ref 5–15)
BUN: 9 mg/dL (ref 8–23)
CO2: 27 mmol/L (ref 22–32)
Calcium: 7.9 mg/dL — ABNORMAL LOW (ref 8.9–10.3)
Chloride: 106 mmol/L (ref 98–111)
Creatinine, Ser: 0.83 mg/dL (ref 0.44–1.00)
GFR, Estimated: >60 mL/min (ref >60)
Glucose, Bld: 81 mg/dL (ref 70–99)
Potassium: 5 mmol/L (ref 3.5–5.1)
Sodium: 138 mmol/L (ref 135–145)

## 2021-09-06 LAB — PHOSPHORUS: Phosphorus: 4.4 mg/dL (ref 2.5–4.6)

## 2021-09-06 LAB — MAGNESIUM: Magnesium: 1.8 mg/dL (ref 1.7–2.4)

## 2021-09-06 NOTE — TOC Progression Note (Signed)
Transition of Care [facility]) - Progression Note    Patient Details  Name: Jamie Wilkins MRN: [id number] Date of Birth: 10-24-1961  [facility] CM/SW Contact  Shelbie Hutching, RN Phone Number: 09/06/2021, 1:39 PM  Clinical Narrative:    Patient has no payer source for SNF and no bed offers because of this.  RNCM met with patient at the bedside, she wants to smoke.  She reports that she smokes everyday and now she has been here without for 6 days and she just wants to smoke, "if I get 2 puffs I will be okay, that's all I need".  MD and nurse notified that patient is irritable because she wants a cigarette.   Patient is doing worse now with PT and OT than when she first got here- she can not walk and is max assist +2 for standing and transferring to chair.   RNCM left a message with Steffanie Dunn at [facility] [phone] for return call- need to see if DSS may be willing to cover rehab stay.    Expected Discharge Plan: [place] Barriers to Discharge: Continued Medical Work up  Expected Discharge Plan and Services Expected Discharge Plan: [place] arrangements for the past 2 months: Single Family Home                                       Social Determinants of Health (SDOH) Interventions    Readmission Risk Interventions No flowsheet data found.

## 2021-09-06 NOTE — Progress Notes (Signed)
Physical Therapy Treatment Patient Details Name: Jamie Wilkins MRN: [id number] DOB: Feb 16, 1961 Today's Date: 09/06/2021   History of Present Illness 60 y.o. female who presents to ED following a DSS welfare check; pt and her neighbor called for Meals on Wheels and EMS ultimately brought pt to hospital. Medical history significant for paroxysmal atrial fibrillation not on anticoagulation due to history of GI bleed, alcoholic cirrhosis, anemia.    PT Comments    Pt received supine in bed, stating that she has already walked this morning, should have walked out of the hospital while she was up and that she needs to be cleaned up. Pt was able to roll with SUP in bilateral directions. She did require MOD A to lift trunk during supine>sit. STS from elevated EOB and low surface of recliner required MAX Ax2 to lift, block B feet (prevent sliding) and shift hips anteriorly over BOS. Once standing and within BOS, pt able to remain standing with CGA and perform stand-pivot with MIN A. RN notified of assist level and transfer ability. Pt remained in recliner at end of session. Would benefit from skilled PT to address above deficits and promote optimal return to PLOF.      Recommendations for follow up therapy are one component of a multi-disciplinary discharge planning process, led by the attending physician.  Recommendations may be updated based on patient status, additional functional criteria and insurance authorization.  Follow Up Recommendations  SNF;Supervision/Assistance - 24 hour     Equipment Recommendations  Other (comment) (TBD at next venue of care)    Recommendations for Other Services       Precautions / Restrictions Precautions Precautions: Fall Precaution Comments: monitor HR Restrictions Weight Bearing Restrictions: No     Mobility  Bed Mobility Overal bed mobility: Needs Assistance Bed Mobility: Supine to Sit;Rolling Rolling: Supervision   Supine to sit: Mod assist      General bed mobility comments: Roll 3x each direction for clean up, to don sacral bandages (RN) and brief and change linens. MOD A to manage trunk to upright, pt managed BLE.    Transfers Overall transfer level: Needs assistance Equipment used: Rolling walker (2 wheeled) Transfers: Sit to/from [employer] Sit to Stand: Max assist;+2 safety/equipment;+2 physical assistance Stand pivot transfers: Min guard       General transfer comment: Pt requiring MAX Ax2 to lift, block feet and shift hip anteriorly. 2 STS performed. Once standing, pt able to perform stand pivot with MIN A to steady and assist with RW management.  Ambulation/Gait             General Gait Details: pt refused   Stairs             Wheelchair Mobility    Modified Rankin (Stroke Patients Only)       Balance Overall balance assessment: Needs assistance Sitting-balance support: Bilateral upper extremity supported Sitting balance-Leahy Scale: Fair Sitting balance - Comments: supervision for safety on EOB   Standing balance support: Bilateral upper extremity supported;During functional activity Standing balance-Leahy Scale: Poor Standing balance comment: Difficulty discovering COM, weight positioned posteriorly. Requires BUE support and CGA to maintain standing once within BOS. Stands for about 30 seconds.                            Cognition Arousal/Alertness: Awake/alert Behavior During Therapy: WFL for tasks assessed/performed Overall Cognitive Status: Impaired/Different from baseline Area of Impairment: Safety/judgement  Safety/Judgement: Decreased awareness of safety;Decreased awareness of deficits     General Comments: Pt reporting she had been walking around her bed this morning - repeats multiple times throughout the session, fully convinced. (pt has been non-ambulatory for 4 months, required +2 to stand)      Exercises  Other Exercises Other Exercises: Pt reporting she needs to be cleaned up upon PT arrival. During clean up, RN applied clean bandages and changed bed linens. Pt requesting to don personal clothes while sitting EOB. She also wants to wear double layers of pants and shirts. Pt unable to thread BLE through pant legs, PT assist to thread to pull up once in standing. Minimal assist with UE body dressing, encouragement required.    General Comments        Pertinent Vitals/Pain Pain Assessment: No/denies pain    Home Living                      Prior Function            PT Goals (current goals can now be found in the care plan section) Acute Rehab PT Goals Patient Stated Goal: to walk again PT Goal Formulation: With patient    Frequency    Min 2X/week      PT Plan      Co-evaluation              AM-PAC PT "6 Clicks" Mobility   Outcome Measure  Help needed turning from your back to your side while in a flat bed without using bedrails?: A Little Help needed moving from lying on your back to sitting on the side of a flat bed without using bedrails?: A Lot Help needed moving to and from a bed to a chair (including a wheelchair)?: A Lot Help needed standing up from a chair using your arms (e.g., wheelchair or bedside chair)?: A Lot Help needed to walk in hospital room?: Total Help needed climbing 3-5 steps with a railing? : Total 6 Click Score: 11    End of Session Equipment Utilized During Treatment: Gait belt Activity Tolerance: Patient limited by fatigue;Treatment limited secondary to agitation (agitation due to pt wanting a cigarette and wanting to d/c) Patient left: in chair;with call bell/phone within reach Nurse Communication: Mobility status;Precautions PT Visit Diagnosis: Muscle weakness (generalized) (M62.81);Adult, failure to thrive (R62.7)     Time: 5093-2671 PT Time Calculation (min) (ACUTE ONLY): 45 min  Charges:  $Therapeutic Activity: 38-52  mins                     Basilia Jumbo PT, DPT 09/06/21 12:34 PM [phone]

## 2021-09-06 NOTE — Plan of Care (Signed)
Pt refused to take her Remeron.  She asked what it was for, told her it helps with depression.  She said she's not depressed, but her behavior indicates otherwise.  She's withdrawn and barely engages.

## 2021-09-06 NOTE — Progress Notes (Signed)
Progress Note    Jamie Wilkins  PYP:[id number] DOB: Apr 17, 1961  DOA: 08/30/2021 PCP: [facility]      Brief Narrative:    Medical records reviewed and are as summarized below:  Jamie Wilkins is a 60 y.o. female with medical history significant for paroxysmal atrial fibrillation not on anticoagulation due to history of GI bleed, alcoholic liver cirrhosis, chronic anemia.  She was brought to the hospital at the recommendation of DSS after she was found sitting at home in her feces.  She complained of generalized weakness and poor oral intake.  She felt so weak that she was unable to get up from her chair.       Assessment/Plan:   Principal Problem:   Severe major depression, single episode, without psychotic features (HCC) Active Problems:   Hypocalcemia   Hypokalemia   Hypomagnesemia   Alcoholic hepatitis without ascites   AF (paroxysmal atrial fibrillation) (HCC)   Hypotension   Depression due to physical illness   Decubitus ulcer   Body mass index is 22.11 kg/m.   Acute on chronic hypotension, dehydration: BP stable.  Continue midodrine.  Paroxysmal atrial fibrillation with RVR: Heart rate has improved.  Avoid rate control for hospital because of hypotension.   Depression with suicidal ideation: She is not suicidal nor homicidal.  Continue mirtazapine.  IVC has been revoked and psychiatrist has signed off.  Alcohol use disorder: Counseled to quit using alcohol.  Continue thiamine and multivitamins.  She is also on CIWA protocol.  Anemia of chronic disease, folic acid deficiency: No indication for blood transfusion at this time.  Continue folic acid.  Hypokalemia, hypomagnesemia, hypocalcemia and hypophosphatemia: Improved  Vitamin D deficiency: Vitamin D level is 20.3.  Continue vitamin D supplement  Alcoholic liver cirrhosis: Compensated  Right buttock deep tissue injury (present on admission): Continue local wound  care.  She wanted to go outside and smoke.  However, she was told it is unsafe to do so especially since she is unsteady on her feet.  Awaiting placement to SNF  Diet Order             Diet regular Room service appropriate? Yes; Fluid consistency: Thin  Diet effective now                      Consultants: Psychiatrist  Procedures: None    Medications:    cholecalciferol  1,000 Units Oral Daily   enoxaparin (LOVENOX) injection  40 mg Subcutaneous Q24H   folic acid  1 mg Oral Daily   midodrine  5 mg Oral TID WC   mirtazapine  30 mg Oral QHS   multivitamin with minerals  1 tablet Oral Daily   nicotine  14 mg Transdermal Daily   polyethylene glycol  17 g Oral Daily   thiamine  100 mg Oral Daily   Or   thiamine  100 mg Intravenous Daily   Continuous Infusions:     Anti-infectives (From admission, onward)    None              Family Communication/Anticipated D/C date and plan/Code Status   DVT prophylaxis: enoxaparin (LOVENOX) injection 40 mg Start: 08/30/21 2330     Code Status: Full Code  Family Communication: None Disposition Plan:    Status is: Inpatient  Remains inpatient appropriate because:Unsafe d/c plan and Inpatient level of care appropriate due to severity of illness  Dispo: The patient is from: Home  Anticipated d/c is to: SNF              Patient currently is medically stable to d/c.   Difficult to place patient Yes           Subjective:   No acute events overnight no dizziness, shortness of breath or chest pain.  Objective:    Vitals:   09/06/21 0558 09/06/21 0804 09/06/21 1006 09/06/21 1134  BP: 98/74 111/80 102/67 106/69  Pulse: (!) 106 98 83 87  Resp: 16 17 20 18   Temp: 97.8 F (36.6 C) 98.9 F (37.2 C) 97.9 F (36.6 C) 98.1 F (36.7 C)  TempSrc: Oral Oral Oral Oral  SpO2: 90% 95% 93% 98%  Weight:      Height:       No data found.   Intake/Output Summary (Last 24 hours) at  09/06/2021 1454 Last data filed at 09/06/2021 1135 Gross per 24 hour  Intake --  Output 1150 ml  Net -1150 ml   Filed Weights   08/30/21 1936  Weight: 62.1 kg    Exam:  GEN: NAD SKIN: Warm and dry EYES: No pallor or icterus ENT: MMM CV: RRR PULM: CTA B ABD: soft, ND, NT, +BS CNS: AAO x 3, non focal EXT: Edema of bilateral feet.  No erythema or tenderness     Data Reviewed:   I have personally reviewed following labs and imaging studies:  Labs: Labs show the following:   Basic Metabolic Panel: Recent Labs  Lab 08/30/21 2007 08/30/21 2051 08/31/21 0621 09/01/21 0504 09/02/21 0625 09/06/21 0431  NA 135  --  137 135 137 138  K 2.5*  --  2.8* 3.6 3.6 5.0  CL 94*  --  102 101 104 106  CO2 27  --  28 27 28 27   GLUCOSE 85  --  98 104* 81 81  BUN 11  --  11 10 8 9   CREATININE 0.77  --  0.72 0.68 0.67 0.83  CALCIUM 7.7*  --  6.9* 7.5* 7.8* 7.9*  MG  --  1.3*  --  2.3 2.1 1.8  PHOS  --  2.2*  --  1.9* 2.6 4.4   GFR Estimated Creatinine Clearance: 66.6 mL/min (by C-G formula based on SCr of 0.83 mg/dL). Liver Function Tests: Recent Labs  Lab 08/30/21 2051 09/01/21 0504  AST 145* 97*  ALT 48* 39  ALKPHOS 278* 233*  BILITOT 1.7* 1.1  PROT 5.7* 4.9*  ALBUMIN 2.0* 1.7*   No results for input(s): LIPASE, AMYLASE in the last 168 hours. Recent Labs  Lab 08/30/21 2051  AMMONIA 33   Coagulation profile Recent Labs  Lab 08/30/21 2051  INR 1.1    CBC: Recent Labs  Lab 08/30/21 2007 08/31/21 2052 09/01/21 0504 09/02/21 0625 09/06/21 0431  WBC 6.3 4.7 5.4 4.6 5.5  NEUTROABS  --   --  2.8 1.8 2.9  HGB 8.6* 7.0* 7.5* 7.7* 7.9*  HCT 25.3* 20.5* 22.6* 24.3* 24.3*  MCV 94.4 93.2 92.6 96.0 99.6  PLT 157 133* 156 170 262   Cardiac Enzymes: No results for input(s): CKTOTAL, CKMB, CKMBINDEX, TROPONINI in the last 168 hours. BNP (last 3 results) No results for input(s): PROBNP in the last 8760 hours. CBG: Recent Labs  Lab 09/02/21 2035  GLUCAP 106*    D-Dimer: No results for input(s): DDIMER in the last 72 hours. Hgb A1c: No results for input(s): HGBA1C in the last 72 hours. Lipid Profile: No results for input(s): CHOL,  HDL, LDLCALC, TRIG, CHOLHDL, LDLDIRECT in the last 72 hours. Thyroid function studies: No results for input(s): TSH, T4TOTAL, T3FREE, THYROIDAB in the last 72 hours.  Invalid input(s): FREET3  Anemia work up: No results for input(s): VITAMINB12, FOLATE, FERRITIN, TIBC, IRON, RETICCTPCT in the last 72 hours.  Sepsis Labs: Recent Labs  Lab 08/31/21 0621 09/01/21 0504 09/02/21 0625 09/06/21 0431  WBC 4.7 5.4 4.6 5.5    Microbiology Recent Results (from the past 240 hour(s))  Resp Panel by RT-PCR (Flu A&B, Covid) Nasopharyngeal Swab     Status: None   Collection Time: 08/30/21 11:25 PM   Specimen: Nasopharyngeal Swab; Nasopharyngeal(NP) swabs in vial transport medium  Result Value Ref Range Status   SARS Coronavirus 2 by RT PCR NEGATIVE NEGATIVE Final    Comment: (NOTE) SARS-CoV-2 target nucleic acids are NOT DETECTED.  The SARS-CoV-2 RNA is generally detectable in upper respiratory specimens during the acute phase of infection. The lowest concentration of SARS-CoV-2 viral copies this assay can detect is 138 copies/mL. A negative result does not preclude SARS-Cov-2 infection and should not be used as the sole basis for treatment or other patient management decisions. A negative result may occur with  improper specimen collection/handling, submission of specimen other than nasopharyngeal swab, presence of viral mutation(s) within the areas targeted by this assay, and inadequate number of viral copies(<138 copies/mL). A negative result must be combined with clinical observations, patient history, and epidemiological information. The expected result is Negative.  Fact Sheet for Patients:  BloggerCourse.com  Fact Sheet for Healthcare Providers:   SeriousBroker.it  This test is no t yet approved or cleared by the [place] FDA and  has been authorized for detection and/or diagnosis of SARS-CoV-2 by FDA under an Emergency Use Authorization (EUA). This EUA will remain  in effect (meaning this test can be used) for the duration of the COVID-19 declaration under Section 564(b)(1) of the Act, 21 U.S.C.section 360bbb-3(b)(1), unless the authorization is terminated  or revoked sooner.       Influenza A by PCR NEGATIVE NEGATIVE Final   Influenza B by PCR NEGATIVE NEGATIVE Final    Comment: (NOTE) The Xpert Xpress SARS-CoV-2/FLU/RSV plus assay is intended as an aid in the diagnosis of influenza from Nasopharyngeal swab specimens and should not be used as a sole basis for treatment. Nasal washings and aspirates are unacceptable for Xpert Xpress SARS-CoV-2/FLU/RSV testing.  Fact Sheet for Patients: BloggerCourse.com  Fact Sheet for Healthcare Providers: SeriousBroker.it  This test is not yet approved or cleared by the [place] FDA and has been authorized for detection and/or diagnosis of SARS-CoV-2 by FDA under an Emergency Use Authorization (EUA). This EUA will remain in effect (meaning this test can be used) for the duration of the COVID-19 declaration under Section 564(b)(1) of the Act, 21 U.S.C. section 360bbb-3(b)(1), unless the authorization is terminated or revoked.  Performed at [facility], [address] [place], [place] [address]     Procedures and diagnostic studies:  No results found.             LOS: 6 days   Sarely Stracener  [facility]   Pager on [email or web address]. If 7PM-7AM, please contact night-coverage at [email or web address]     09/06/2021, 2:54 PM

## 2021-09-07 MED ORDER — VITAMIN D (ERGOCALCIFEROL) 1.25 MG (50000 UNIT) PO CAPS
50000.0000 [IU] | ORAL_CAPSULE | ORAL | Status: DC
  Administered 2021-09-15: 50000 [IU] via ORAL
  Filled 2021-09-07 (×2): qty 1

## 2021-09-07 NOTE — Progress Notes (Signed)
Occupational Therapy Treatment Patient Details Name: Jamie Wilkins MRN: [id number] DOB: 27-Jul-1961 Today's Date: 09/07/2021   History of present illness 60 y.o. female who presents to ED following a DSS welfare check; pt and her neighbor called for Meals on Wheels and EMS ultimately brought pt to hospital. Medical history significant for paroxysmal atrial fibrillation not on anticoagulation due to history of GI bleed, alcoholic cirrhosis, anemia.   OT comments  Pt seen for OT treatment on this date. Upon 2nd attempt this date, pt agreeable to OT tx. Pt with improved motivation this afternoon, stating that she wants to be able to walk at son's wedding next month and showing this author the dress she plans to wear. Pt currently requires MIN A for bed mobility, MIN GUARD for seated LB dressing, and MOD A for stand pivot transfer. While seated in recliner, pt engaged in seated UE/LE therapy exercises to improve strength during functional activities. Pt is making good progress toward goals and continues to benefit from skilled OT services to maximize return to PLOF and minimize risk of future falls, injury, caregiver burden, and readmission. Will continue to follow POC. Discharge recommendation remains appropriate.     Recommendations for follow up therapy are one component of a multi-disciplinary discharge planning process, led by the attending physician.  Recommendations may be updated based on patient status, additional functional criteria and insurance authorization.    Follow Up Recommendations  SNF;Supervision/Assistance - 24 hour    Equipment Recommendations  Other (comment) (defer to next venue of care)       Precautions / Restrictions Precautions Precautions: Fall Restrictions Weight Bearing Restrictions: No       Mobility Bed Mobility Overal bed mobility: Needs Assistance       Supine to sit: Min assist     General bed mobility comments: MIN A for trunk support     Transfers Overall transfer level: Needs assistance Equipment used: Rolling walker (2 wheeled) Transfers: Sit to/from [employer] Sit to Stand: Mod assist Stand pivot transfers: Mod assist       General transfer comment: Once standing, pt able to perform stand pivot with MOD A to steady and assist with RW management.    Balance Overall balance assessment: Needs assistance Sitting-balance support: No upper extremity supported;Feet unsupported Sitting balance-Leahy Scale: Good Sitting balance - Comments: Good static sitting balance at EOB   Standing balance support: Bilateral upper extremity supported;During functional activity Standing balance-Leahy Scale: Poor Standing balance comment: Pt requires MOD A to maintain dynamic standing balance with BUE support from RW                           ADL either performed or assessed with clinical judgement   ADL Overall ADL's : Needs assistance/impaired                     Lower Body Dressing: Min guard;Sitting/lateral leans Lower Body Dressing Details (indicate cue type and reason): to don/doff socks                    Cognition Arousal/Alertness: Awake/alert Behavior During Therapy: WFL for tasks assessed/performed Overall Cognitive Status: Within Functional Limits for tasks assessed                                 General Comments: Pt alert and oriented to self, place, and date.  Pt with improved motivation this date, stating that she wants to be able to walk at son's wedding next month        Exercises General Exercises - Upper Extremity Shoulder Horizontal ABduction: AROM;Both;10 reps;Seated Shoulder Horizontal ADduction: AROM;Both;10 reps;Seated General Exercises - Lower Extremity Ankle Circles/Pumps: AROM;Both;10 reps;Seated Long Arc Quad: AROM;Both;10 reps;Seated Hip Flexion/Marching: AROM;Both;10 reps;Seated           Pertinent Vitals/ Pain       Pain  Assessment: No/denies pain         Frequency  Min 2X/week        Progress Toward Goals  OT Goals(current goals can now be found in the care plan section)  Progress towards OT goals: Progressing toward goals  Acute Rehab OT Goals Patient Stated Goal: to walk again OT Goal Formulation: With patient Time For Goal Achievement: 09/15/21 Potential to Achieve Goals: Good  Plan Discharge plan remains appropriate;Frequency remains appropriate       AM-PAC OT "6 Clicks" Daily Activity     Outcome Measure   Help from another person eating meals?: None Help from another person taking care of personal grooming?: A Little Help from another person toileting, which includes using toliet, bedpan, or urinal?: A Lot Help from another person bathing (including washing, rinsing, drying)?: A Lot Help from another person to put on and taking off regular upper body clothing?: A Little Help from another person to put on and taking off regular lower body clothing?: A Lot 6 Click Score: 16    End of Session Equipment Utilized During Treatment: Gait belt;Rolling walker  OT Visit Diagnosis: Unsteadiness on feet (R26.81);Muscle weakness (generalized) (M62.81)   Activity Tolerance Patient tolerated treatment well   Patient Left in chair;with call bell/phone within reach   Nurse Communication Mobility status        Time: 2334-3568 OT Time Calculation (min): 29 min  Charges: OT General Charges $OT Visit: 1 Visit OT Treatments $Self Care/Home Management : 8-22 mins $Therapeutic Activity: 8-22 mins  Matthew Folks, OTR/L [facility] [phone]

## 2021-09-07 NOTE — Progress Notes (Addendum)
Progress Note    Jamie Wilkins  ZES:[id number] DOB: 1961-11-08  DOA: 08/30/2021 PCP: [facility]      Brief Narrative:    Medical records reviewed and are as summarized below:  Jamie Wilkins is a 60 y.o. female with medical history significant for paroxysmal atrial fibrillation not on anticoagulation due to history of GI bleed, alcoholic liver cirrhosis, chronic anemia.  She was brought to the hospital at the recommendation of DSS after she was found sitting at home in her feces.  She complained of generalized weakness and poor oral intake.  She felt so weak that she was unable to get up from her chair.   She was admitted to the hospital for dehydration, hypotension, hypokalemia, hypomagnesemia, hypophosphatemia and hypocalcemia.  She was treated with IV fluids and her electrolytes were repleted.  Despite adequate IV resuscitation, blood pressure remained low.  Patient said that her blood pressure normally runs low.  She was started on midodrine for hypotension.  She also atrial fibrillation with RVR but rate control drugs were avoided because of hypotension.  Fortunately, her heart rate has improved.  She was seen by the psychiatrist for depression and suicidal ideation.  She was started on mirtazapine for depression.  She was initially placed on IVC but this has been revoked.  She was evaluated by PT and OT who recommended further rehabilitation at [facility].  Assessment/Plan:   Principal Problem:   Severe major depression, single episode, without psychotic features (HCC) Active Problems:   Hypocalcemia   Hypokalemia   Hypomagnesemia   Alcoholic hepatitis without ascites   AF (paroxysmal atrial fibrillation) (HCC)   Hypotension   Depression due to physical illness   Decubitus ulcer   Body mass index is 22.11 kg/m.   Acute on chronic hypotension, dehydration: BP is on the low side but stable.  Continue midodrine.  Paroxysmal atrial fibrillation  with RVR: Heart rate has improved.  Avoid rate control for hospital because of hypotension.   Depression with suicidal ideation: She is not suicidal nor homicidal.  Continue mirtazapine.  IVC has been revoked and psychiatrist has signed off.  Alcohol use disorder: Counseled to quit using alcohol.  Continue thiamine and multivitamins.  She is also on CIWA protocol.  Anemia of chronic disease, folic acid deficiency: No indication for blood transfusion at this time.  Continue folic acid.  Hypokalemia, hypomagnesemia, hypocalcemia and hypophosphatemia: Improved  Vitamin D deficiency: Vitamin D level is 20.3.  Continue vitamin D supplement.  She was taking vitamin D2 50,000 units once a week.  Resume vitamin D2.  Alcoholic liver cirrhosis: Compensated  Right buttock deep tissue injury (present on admission): Continue local wound care. Awaiting placement to SNF  Diet Order             Diet regular Room service appropriate? Yes; Fluid consistency: Thin  Diet effective now                      Consultants: Psychiatrist  Procedures: None    Medications:    cholecalciferol  1,000 Units Oral Daily   enoxaparin (LOVENOX) injection  40 mg Subcutaneous Q24H   folic acid  1 mg Oral Daily   midodrine  5 mg Oral TID WC   mirtazapine  30 mg Oral QHS   multivitamin with minerals  1 tablet Oral Daily   nicotine  14 mg Transdermal Daily   polyethylene glycol  17 g Oral Daily  thiamine  100 mg Oral Daily   Or   thiamine  100 mg Intravenous Daily   Continuous Infusions:     Anti-infectives (From admission, onward)    None              Family Communication/Anticipated D/C date and plan/Code Status   DVT prophylaxis: enoxaparin (LOVENOX) injection 40 mg Start: 08/30/21 2330     Code Status: Full Code  Family Communication: None Disposition Plan:    Status is: Inpatient  Remains inpatient appropriate because:Unsafe d/c plan and Inpatient level of care  appropriate due to severity of illness  Dispo: The patient is from: Home              Anticipated d/c is to: SNF              Patient currently is medically stable to d/c.   Difficult to place patient Yes           Subjective:   Interval events noted.  No shortness of breath, dizziness, palpitations or chest pain.  She is frustrated because she still has not gotten a nursing home bed.  Objective:    Vitals:   09/07/21 0059 09/07/21 0420 09/07/21 0759 09/07/21 1113  BP: 106/69 98/64 97/64  91/60  Pulse: 98 95 92 89  Resp: 18 16 16 16   Temp: 98 F (36.7 C) 97.7 F (36.5 C) 98.4 F (36.9 C) 98.5 F (36.9 C)  TempSrc: Skin Skin Oral Oral  SpO2: 95% 92% 93% 95%  Weight:      Height:       No data found.   Intake/Output Summary (Last 24 hours) at 09/07/2021 1404 Last data filed at 09/07/2021 1356 Gross per 24 hour  Intake 120 ml  Output 950 ml  Net -830 ml   Filed Weights   08/30/21 1936  Weight: 62.1 kg    Exam: GEN: NAD SKIN: No rash EYES: EOMI ENT: MMM CV: RRR PULM: CTA B ABD: soft, ND, NT, +BS CNS: AAO x 3, non focal EXT: Mild edema bilateral feet.  No erythema or tenderness     Data Reviewed:   I have personally reviewed following labs and imaging studies:  Labs: Labs show the following:   Basic Metabolic Panel: Recent Labs  Lab 09/01/21 0504 09/02/21 0625 09/06/21 0431  NA 135 137 138  K 3.6 3.6 5.0  CL 101 104 106  CO2 27 28 27   GLUCOSE 104* 81 81  BUN 10 8 9   CREATININE 0.68 0.67 0.83  CALCIUM 7.5* 7.8* 7.9*  MG 2.3 2.1 1.8  PHOS 1.9* 2.6 4.4   GFR Estimated Creatinine Clearance: 66.6 mL/min (by C-G formula based on SCr of 0.83 mg/dL). Liver Function Tests: Recent Labs  Lab 09/01/21 0504  AST 97*  ALT 39  ALKPHOS 233*  BILITOT 1.1  PROT 4.9*  ALBUMIN 1.7*   No results for input(s): LIPASE, AMYLASE in the last 168 hours. No results for input(s): AMMONIA in the last 168 hours.  Coagulation profile No results  for input(s): INR, PROTIME in the last 168 hours.   CBC: Recent Labs  Lab 09/01/21 0504 09/02/21 0625 09/06/21 0431  WBC 5.4 4.6 5.5  NEUTROABS 2.8 1.8 2.9  HGB 7.5* 7.7* 7.9*  HCT 22.6* 24.3* 24.3*  MCV 92.6 96.0 99.6  PLT 156 170 262   Cardiac Enzymes: No results for input(s): CKTOTAL, CKMB, CKMBINDEX, TROPONINI in the last 168 hours. BNP (last 3 results) No results for input(s): PROBNP in  the last 8760 hours. CBG: Recent Labs  Lab 09/02/21 2035  GLUCAP 106*   D-Dimer: No results for input(s): DDIMER in the last 72 hours. Hgb A1c: No results for input(s): HGBA1C in the last 72 hours. Lipid Profile: No results for input(s): CHOL, HDL, LDLCALC, TRIG, CHOLHDL, LDLDIRECT in the last 72 hours. Thyroid function studies: No results for input(s): TSH, T4TOTAL, T3FREE, THYROIDAB in the last 72 hours.  Invalid input(s): FREET3  Anemia work up: No results for input(s): VITAMINB12, FOLATE, FERRITIN, TIBC, IRON, RETICCTPCT in the last 72 hours.  Sepsis Labs: Recent Labs  Lab 09/01/21 0504 09/02/21 0625 09/06/21 0431  WBC 5.4 4.6 5.5    Microbiology Recent Results (from the past 240 hour(s))  Resp Panel by RT-PCR (Flu A&B, Covid) Nasopharyngeal Swab     Status: None   Collection Time: 08/30/21 11:25 PM   Specimen: Nasopharyngeal Swab; Nasopharyngeal(NP) swabs in vial transport medium  Result Value Ref Range Status   SARS Coronavirus 2 by RT PCR NEGATIVE NEGATIVE Final    Comment: (NOTE) SARS-CoV-2 target nucleic acids are NOT DETECTED.  The SARS-CoV-2 RNA is generally detectable in upper respiratory specimens during the acute phase of infection. The lowest concentration of SARS-CoV-2 viral copies this assay can detect is 138 copies/mL. A negative result does not preclude SARS-Cov-2 infection and should not be used as the sole basis for treatment or other patient management decisions. A negative result may occur with  improper specimen collection/handling,  submission of specimen other than nasopharyngeal swab, presence of viral mutation(s) within the areas targeted by this assay, and inadequate number of viral copies(<138 copies/mL). A negative result must be combined with clinical observations, patient history, and epidemiological information. The expected result is Negative.  Fact Sheet for Patients:  BloggerCourse.com  Fact Sheet for Healthcare Providers:  SeriousBroker.it  This test is no t yet approved or cleared by the [place] FDA and  has been authorized for detection and/or diagnosis of SARS-CoV-2 by FDA under an Emergency Use Authorization (EUA). This EUA will remain  in effect (meaning this test can be used) for the duration of the COVID-19 declaration under Section 564(b)(1) of the Act, 21 U.S.C.section 360bbb-3(b)(1), unless the authorization is terminated  or revoked sooner.       Influenza A by PCR NEGATIVE NEGATIVE Final   Influenza B by PCR NEGATIVE NEGATIVE Final    Comment: (NOTE) The Xpert Xpress SARS-CoV-2/FLU/RSV plus assay is intended as an aid in the diagnosis of influenza from Nasopharyngeal swab specimens and should not be used as a sole basis for treatment. Nasal washings and aspirates are unacceptable for Xpert Xpress SARS-CoV-2/FLU/RSV testing.  Fact Sheet for Patients: BloggerCourse.com  Fact Sheet for Healthcare Providers: SeriousBroker.it  This test is not yet approved or cleared by the [place] FDA and has been authorized for detection and/or diagnosis of SARS-CoV-2 by FDA under an Emergency Use Authorization (EUA). This EUA will remain in effect (meaning this test can be used) for the duration of the COVID-19 declaration under Section 564(b)(1) of the Act, 21 U.S.C. section 360bbb-3(b)(1), unless the authorization is terminated or revoked.  Performed at [facility], [address] [place], [place] [address]     Procedures and diagnostic studies:  No results found.             LOS: 7 days   Malon Siddall  [facility]   Pager on [email or web address]. If 7PM-7AM, please contact night-coverage at [email or web address]     09/07/2021, 2:04  PM

## 2021-09-07 NOTE — TOC Progression Note (Signed)
Transition of Care [facility]) - Progression Note    Patient Details  Name: Jamie Wilkins MRN: [id number] Date of Birth: Jan 24, 1961  [facility] CM/SW Contact  Allayne Butcher, RN Phone Number: 09/07/2021, 4:27 PM  Clinical Narrative:    Making referral to LME, Jamie Wilkins.  Also leadership reports that TOC can offer an LOG for short term rehab.  TOC will reach out to facilities and offer LOG (Letter of Guarantee).   Expected Discharge Plan: Home w Home Health Services Barriers to Discharge: Continued Medical Work up  Expected Discharge Plan and Services Expected Discharge Plan: Home w Home Health Services       Living arrangements for the past 2 months: Single Family Home                                       Social Determinants of Health (SDOH) Interventions    Readmission Risk Interventions No flowsheet data found.

## 2021-09-08 NOTE — Progress Notes (Signed)
PROGRESS NOTE    Jamie Wilkins   ZHG:[id number]  DOB: 08-19-1961  PCP: [facility]    DOA: 08/30/2021 LOS: 8    Brief Narrative / Hospital Course to Date:   "Jamie Wilkins is a 60 y.o. female with medical history significant for paroxysmal atrial fibrillation not on anticoagulation due to history of GI bleed, alcoholic liver cirrhosis, chronic anemia.  She was brought to the hospital at the recommendation of DSS after she was found sitting at home in her feces.  She complained of generalized weakness and poor oral intake.  She felt so weak that she was unable to get up from her chair.    She was admitted to the hospital for dehydration, hypotension, hypokalemia, hypomagnesemia, hypophosphatemia and hypocalcemia.  She was treated with IV fluids and her electrolytes were repleted.  Despite adequate IV resuscitation, blood pressure remained low.  Patient said that her blood pressure normally runs low.  She was started on midodrine for hypotension.  She also atrial fibrillation with RVR but rate control drugs were avoided because of hypotension.  Fortunately, her heart rate has improved.   She was seen by the psychiatrist for depression and suicidal ideation.  She was started on mirtazapine for depression.  She was initially placed on IVC but this has been revoked.   She was evaluated by PT and OT who recommended further rehabilitation at SNF."  Assessment & Plan   Principal Problem:   Severe major depression, single episode, without psychotic features (HCC) Active Problems:   Hypocalcemia   Hypokalemia   Hypomagnesemia   Alcoholic hepatitis without ascites   AF (paroxysmal atrial fibrillation) (HCC)   Hypotension   Depression due to physical illness   Decubitus ulcer     Acute on chronic hypotension, dehydration: BP remains soft/low but stable. Continue midodrine. Maintain MAP > 65  Paroxysmal atrial fibrillation with RVR: HR now controlled.  Rate  control agents limited by hypotension.     Depression with suicidal ideation: stable without SI or HI.   Continue mirtazapine.   IVC has been revoked.  Psychiatrist has signed off.  Alcohol use disorder: no active s/sx's of withdrawal. Counseled regarding cessation of alcohol use and its harmful health effects.  Continue thiamine and multivitamins.   Monitored on CIWA protocol.   Anemia of chronic disease, folic acid deficiency: Hbg stable.   Continue folic acid. Monitor CBC.  Hypokalemia, hypomagnesemia, hypocalcemia and hypophosphatemia: Improved with replacement.  Monitor and replace as needed.  Vitamin D deficiency: Vitamin D level is 20.3.   Continue vitamin D supplement.   Was taking vitamin D2 50,000 units once a week.     Alcoholic liver cirrhosis: Compensated, no acute issues. Monitor.   Right buttock deep tissue injury (present on admission): see below description Continue local wound care. Awaiting placement to SNF. Pressure Injury 08/31/21 Buttocks Right Deep Tissue Pressure Injury - Purple or maroon localized area of discolored intact skin or blood-filled blister due to damage of underlying soft tissue from pressure and/or shear. (Active)  08/31/21 1800  Location: Buttocks  Location Orientation: Right  Staging: Deep Tissue Pressure Injury - Purple or maroon localized area of discolored intact skin or blood-filled blister due to damage of underlying soft tissue from pressure and/or shear.  Wound Description (Comments):   Present on Admission: Yes     Patient BMI: Body mass index is 22.11 kg/m.   DVT prophylaxis: enoxaparin (LOVENOX) injection 40 mg Start: 08/30/21 2330   Diet:  Diet Orders (From admission, onward)     Start     Ordered   08/30/21 2320  Diet regular Room service appropriate? Yes; Fluid consistency: Thin  Diet effective now       Question Answer Comment  Room service appropriate? Yes   Fluid consistency: Thin      08/30/21 2320               Code Status: Full Code   Subjective 09/08/21    Patient awake sitting up in bed when seen on rounds today.  She reports being ready to go to rehab.  Thinks she could manage her care at home but staff reports she is requiring significant assistance for mobility and ADLs.  Patient otherwise reports feeling well denies fevers chills, abdominal pain, nausea vomiting, chest pain or shortness of breath.  No acute events reported.   Disposition Plan & Communication   Status is: Inpatient  Remains inpatient appropriate because: Requires SNF placement, pending   Consults, Procedures, Significant Events   Consultants:  Psychiatry  Procedures:  None  Antimicrobials:  Anti-infectives (From admission, onward)    None         Micro    Objective   Vitals:   09/07/21 2016 09/08/21 0524 09/08/21 0742 09/08/21 1201  BP: 97/86 101/61 90/67 96/64   Pulse: 93 100 93 96  Resp: 18 18 18 18   Temp: 98.6 F (37 C) 98.3 F (36.8 C) 98.2 F (36.8 C) 98.7 F (37.1 C)  TempSrc: Oral Oral Oral Oral  SpO2: 100% 93% 93%   Weight:      Height:        Intake/Output Summary (Last 24 hours) at 09/08/2021 1508 Last data filed at 09/08/2021 1030 Gross per 24 hour  Intake 240 ml  Output --  Net 240 ml   Filed Weights   08/30/21 1936  Weight: 62.1 kg    Physical Exam:  General exam: awake, alert, no acute distress HEENT: moist mucus membranes, hearing grossly normal  Respiratory system: CTAB, no wheezes, rales or rhonchi, normal respiratory effort. Cardiovascular system: normal S1/S2, RRR, trace lower extremity edema.   Gastrointestinal system: soft, NT, ND, +bowel sounds. Central nervous system: A&O x3. no gross focal neurologic deficits, normal speech Skin: dry, intact, normal temperature Psychiatry: normal mood, congruent affect, judgement and insight appear abnormal  Labs   Data Reviewed: I have personally reviewed following labs and imaging studies  CBC: Recent  Labs  Lab 09/02/21 0625 09/06/21 0431  WBC 4.6 5.5  NEUTROABS 1.8 2.9  HGB 7.7* 7.9*  HCT 24.3* 24.3*  MCV 96.0 99.6  PLT 170 262   Basic Metabolic Panel: Recent Labs  Lab 09/02/21 0625 09/06/21 0431  NA 137 138  K 3.6 5.0  CL 104 106  CO2 28 27  GLUCOSE 81 81  BUN 8 9  CREATININE 0.67 0.83  CALCIUM 7.8* 7.9*  MG 2.1 1.8  PHOS 2.6 4.4   GFR: Estimated Creatinine Clearance: 66.6 mL/min (by C-G formula based on SCr of 0.83 mg/dL). Liver Function Tests: No results for input(s): AST, ALT, ALKPHOS, BILITOT, PROT, ALBUMIN in the last 168 hours. No results for input(s): LIPASE, AMYLASE in the last 168 hours. No results for input(s): AMMONIA in the last 168 hours. Coagulation Profile: No results for input(s): INR, PROTIME in the last 168 hours. Cardiac Enzymes: No results for input(s): CKTOTAL, CKMB, CKMBINDEX, TROPONINI in the last 168 hours. BNP (last 3 results) No results for input(s): PROBNP in the  last 8760 hours. HbA1C: No results for input(s): HGBA1C in the last 72 hours. CBG: Recent Labs  Lab 09/02/21 2035  GLUCAP 106*   Lipid Profile: No results for input(s): CHOL, HDL, LDLCALC, TRIG, CHOLHDL, LDLDIRECT in the last 72 hours. Thyroid Function Tests: No results for input(s): TSH, T4TOTAL, FREET4, T3FREE, THYROIDAB in the last 72 hours. Anemia Panel: No results for input(s): VITAMINB12, FOLATE, FERRITIN, TIBC, IRON, RETICCTPCT in the last 72 hours. Sepsis Labs: No results for input(s): PROCALCITON, LATICACIDVEN in the last 168 hours.  Recent Results (from the past 240 hour(s))  Resp Panel by RT-PCR (Flu A&B, Covid) Nasopharyngeal Swab     Status: None   Collection Time: 08/30/21 11:25 PM   Specimen: Nasopharyngeal Swab; Nasopharyngeal(NP) swabs in vial transport medium  Result Value Ref Range Status   SARS Coronavirus 2 by RT PCR NEGATIVE NEGATIVE Final    Comment: (NOTE) SARS-CoV-2 target nucleic acids are NOT DETECTED.  The SARS-CoV-2 RNA is generally  detectable in upper respiratory specimens during the acute phase of infection. The lowest concentration of SARS-CoV-2 viral copies this assay can detect is 138 copies/mL. A negative result does not preclude SARS-Cov-2 infection and should not be used as the sole basis for treatment or other patient management decisions. A negative result may occur with  improper specimen collection/handling, submission of specimen other than nasopharyngeal swab, presence of viral mutation(s) within the areas targeted by this assay, and inadequate number of viral copies(<138 copies/mL). A negative result must be combined with clinical observations, patient history, and epidemiological information. The expected result is Negative.  Fact Sheet for Patients:  BloggerCourse.com  Fact Sheet for Healthcare Providers:  SeriousBroker.it  This test is no t yet approved or cleared by the [place] FDA and  has been authorized for detection and/or diagnosis of SARS-CoV-2 by FDA under an Emergency Use Authorization (EUA). This EUA will remain  in effect (meaning this test can be used) for the duration of the COVID-19 declaration under Section 564(b)(1) of the Act, 21 U.S.C.section 360bbb-3(b)(1), unless the authorization is terminated  or revoked sooner.       Influenza A by PCR NEGATIVE NEGATIVE Final   Influenza B by PCR NEGATIVE NEGATIVE Final    Comment: (NOTE) The Xpert Xpress SARS-CoV-2/FLU/RSV plus assay is intended as an aid in the diagnosis of influenza from Nasopharyngeal swab specimens and should not be used as a sole basis for treatment. Nasal washings and aspirates are unacceptable for Xpert Xpress SARS-CoV-2/FLU/RSV testing.  Fact Sheet for Patients: BloggerCourse.com  Fact Sheet for Healthcare Providers: SeriousBroker.it  This test is not yet approved or cleared by the [place] FDA  and has been authorized for detection and/or diagnosis of SARS-CoV-2 by FDA under an Emergency Use Authorization (EUA). This EUA will remain in effect (meaning this test can be used) for the duration of the COVID-19 declaration under Section 564(b)(1) of the Act, 21 U.S.C. section 360bbb-3(b)(1), unless the authorization is terminated or revoked.  Performed at [facility], [address] [place], [place] [address]       Imaging Studies   No results found.   Medications   Scheduled Meds:  cholecalciferol  1,000 Units Oral Daily   enoxaparin (LOVENOX) injection  40 mg Subcutaneous Q24H   folic acid  1 mg Oral Daily   midodrine  5 mg Oral TID WC   mirtazapine  30 mg Oral QHS   multivitamin with minerals  1 tablet Oral Daily   nicotine  14  mg Transdermal Daily   polyethylene glycol  17 g Oral Daily   thiamine  100 mg Oral Daily   Or   thiamine  100 mg Intravenous Daily   Vitamin D (Ergocalciferol)  50,000 Units Oral Q7 days   Continuous Infusions:     LOS: 8 days    Time spent: 30 minutes    Pennie Banter, DO [facility]  09/08/2021, 3:08 PM      If 7PM-7AM, please contact night-coverage. How to contact the [facility] Attending or Consulting provider 7A - 7P or covering provider during after hours 7P -7A, for this patient?    Check the care team in [facility] and look for a) attending/consulting TRH provider listed and b) the [facility] team listed Log into [email or web address] and use [place]'s universal password to access. If you do not have the password, please contact the hospital operator. Locate the [facility] provider you are looking for under [facility] and page to a number that you can be directly reached. If you still have difficulty reaching the provider, please page the [facility] (Director on Call) for the Hospitalists listed on amion for assistance.

## 2021-09-08 NOTE — Progress Notes (Signed)
PT Cancellation Note  Patient Details Name: Jamie Wilkins MRN: [id number] DOB: 08/25/1961   Cancelled Treatment:    Reason Eval/Treat Not Completed: Patient declined, no reason specified. 2nd attempt today. Pt refusing participation - both OOB and supine therex. Pt mood appears to have declined since earlier encounter, upset that she is not going home or d/c. Will re-attempt at later date.    Basilia Jumbo PT, DPT 09/08/21 4:06 PM [phone]

## 2021-09-08 NOTE — TOC Progression Note (Signed)
Transition of Care [facility]) - Progression Note    Patient Details  Name: Jamie Wilkins MRN: [id number] Date of Birth: November 13, 1961  Transition [facility]) CM/SW Contact  Shelbie Hutching, RN Phone Number: 09/08/2021, 2:50 PM  Clinical Narrative:    TOC is still searching for placement.  TOC will offer an LOG to facility for 30 days of rehab.  RNCM met with patient at the bedside and explained to her that a rehab bed is not available yet and that patient needs rehab before she can safely return home.  Patient verbalizes understanding.     Expected Discharge Plan: [place] Barriers to Discharge: Continued Medical Work up  Expected Discharge Plan and Services Expected Discharge Plan: [place] arrangements for the past 2 months: Single Family Home                                       Social Determinants of Health (SDOH) Interventions    Readmission Risk Interventions No flowsheet data found.

## 2021-09-08 NOTE — Progress Notes (Signed)
PT Cancellation Note  Patient Details Name: Jamie Wilkins MRN: [id number] DOB: October 03, 1961   Cancelled Treatment:    Reason Eval/Treat Not Completed: Patient declined, no reason specified. Chart reviewed. Pt declined therapy, she states according to conversation with MD pt is to d/c later today and pt would like to conserve energy for later d/c. PT then spoke to RN who states there are not d/c orders at this time. PT will attempt to return later this afternoon.    Basilia Jumbo PT, DPT 09/08/21 12:15 PM [phone]

## 2021-09-09 MED ORDER — TRAMADOL HCL 50 MG PO TABS
50.0000 mg | ORAL_TABLET | Freq: Four times a day (QID) | ORAL | Status: DC | PRN
  Administered 2021-09-09 – 2021-09-16 (×2): 50 mg via ORAL
  Filled 2021-09-09 (×2): qty 1

## 2021-09-09 MED ORDER — ACETAMINOPHEN 325 MG PO TABS
650.0000 mg | ORAL_TABLET | Freq: Four times a day (QID) | ORAL | Status: DC | PRN
  Administered 2021-09-09: 650 mg via ORAL
  Filled 2021-09-09: qty 2

## 2021-09-09 NOTE — Progress Notes (Signed)
Occupational Therapy Treatment Patient Details Name: Jamie Wilkins MRN: [id number] DOB: [birth year] Today's Date: 09/09/2021   History of present illness 60 y.o. female who presents to ED following a DSS welfare check; pt and her neighbor called for Meals on Wheels and EMS ultimately brought pt to hospital. Medical history significant for paroxysmal atrial fibrillation not on anticoagulation due to history of GI bleed, alcoholic cirrhosis, anemia.   OT comments  Pt seen for OT tx this date to f/u re: safety with ADLs/ADL mobility. Pt is more agreeable and participatory this session. She is sitting up in chair and willing to get up to attempt ADL participation. OT engages pt in CTS x3 trials with third one being successful with cues for hand/foot placement, rocking for momentum, and MOD A. Pt with F static standing balance. Pt limited d/t being fearful of falling which she verbally endorses after 2 unsuccessful standing attempts.   She completes fxl mobility with CGA to SUPV to sink-side and completes standing oral care with SETUP/CGA. Pt completes fxl mobility to restroom as she starts to have episode of bowel incontinence. Pt states her commode at home is elevated, so OT elevates with BSC, but pt still requires MOD A to CTS from [facility] after small BM. She requires MOD A for thorough completion of peri care and SETUP for seated hand washing. Pt left in bed at end of session with all needs met and in reach, alarm set.    Extended discussion re: safety and d/c planning was had with patient engaged and asking appropriate questions. OT will continue to follow acutely and continue to anticipate pt will require STR upon d/c from acute setting to improve strength and activity tolerance.    Recommendations for follow up therapy are one component of a multi-disciplinary discharge planning process, led by the attending physician.  Recommendations may be updated based on patient status, additional functional  criteria and insurance authorization.    Follow Up Recommendations  SNF;Supervision/Assistance - 24 hour    Equipment Recommendations  Other (comment) (defer to next venue, anticiapte pt will at least require BSC and shower chair should she d/c home)    Recommendations for Other Services      Precautions / Restrictions Precautions Precautions: Fall Precaution Comments: monitor HR Restrictions Weight Bearing Restrictions: No       Mobility Bed Mobility Overal bed mobility: Needs Assistance Bed Mobility: Sit to Supine       Sit to supine: Supervision;HOB elevated   General bed mobility comments: increased time to eleavte LEs for back to bed end of session    Transfers Overall transfer level: Needs assistance Equipment used: Rolling walker (2 wheeled) Transfers: Sit to/from Stand Sit to Stand: Mod assist         General transfer comment: first two tries to come to stand, unsuccesful. Pt reporting being fearful, states she has been needing 2p assist to CTS in the mornings at home d/t fearfullness and subsequently needs at least one person to get up from commode throughout the day (appears she is calling friends for help each time). Ultimately able to CTS with cues for hand/foot placement, counting/rocking to gain momentum, and MOD A.    Balance Overall balance assessment: Needs assistance Sitting-balance support: No upper extremity supported;Feet unsupported Sitting balance-Leahy Scale: Good     Standing balance support: Bilateral upper extremity supported;During functional activity Standing balance-Leahy Scale: Fair Standing balance comment: F static standing and F with fxl mobility, P dynamic balance/reaching  ADL either performed or assessed with clinical judgement   ADL Overall ADL's : Needs assistance/impaired     Grooming: Oral care;Min guard;Standing Grooming Details (indicate cue type and reason): sink-side, standing  with RW, to complete 1 g/h task. She later washes hands in sitting with SETUP         Upper Body Dressing : Set up;Sitting       Toilet Transfer: Moderate assistance;Maximal assistance;BSC;Grab bars;RW;Ambulation Armed forces technical officer Details (indicate cue type and reason): requires MOD/MAX A, seemingly somewhat d/t weakness, but mostly d/t fearful of falling, to CTS with RW from [facility] over standard commode to elevate (has elevated commode at home and uses lift chair). Toileting- Clothing Manipulation and Hygiene: Moderate assistance;Sit to/from stand Toileting - Clothing Manipulation Details (indicate cue type and reason): pt able to participate in posterior peri care in standing with SETUP/CGA, but requires MOD A for thorough completion.     Functional mobility during ADLs: Supervision/safety;Min guard;Rolling walker (CGA initially, with progress to SUPV. F balance with B UE support.)       Vision Baseline Vision/History: 1 Wears glasses Patient Visual Report: No change from baseline     Perception     Praxis      Cognition Arousal/Alertness: Awake/alert Behavior During Therapy: WFL for tasks assessed/performed Overall Cognitive Status: Within Functional Limits for tasks assessed Area of Impairment: Safety/judgement                         Safety/Judgement: Decreased awareness of safety;Decreased awareness of deficits     General Comments: pt is alert and oriented. She is motivated and pleasant throughout with OT, she is noted to be negative towards/about other members of the staff but can generally be re-directed.        Exercises Other Exercises Other Exercises: OT engages pt in discussion regarding rehab potential, d/c options, safety/fall prevention considerations, and what is practical for help at home (considering she relies heavily on friends/neighbors to CTS after every trip to the bathroom which is not practical for the forseeable future). Pt is agreeable and  engaged in discussion.   Shoulder Instructions       General Comments      Pertinent Vitals/ Pain       Pain Assessment: 0-10 Pain Score: 5  Pain Location: B LEs Pain Descriptors / Indicators: Pins and needles;Tingling (describes neuropathy-like s/s in LEs, with the discomfort mostly starting after she repositions legs dependent.) Pain Intervention(s): Monitored during session;Repositioned  Home Living                                          Prior Functioning/Environment              Frequency  Min 2X/week        Progress Toward Goals  OT Goals(current goals can now be found in the care plan section)  Progress towards OT goals: Progressing toward goals  Acute Rehab OT Goals Patient Stated Goal: to walk again OT Goal Formulation: With patient Time For Goal Achievement: 09/15/21 Potential to Achieve Goals: Good  Plan Discharge plan remains appropriate;Frequency remains appropriate    Co-evaluation                 AM-PAC OT "6 Clicks" Daily Activity     Outcome Measure   Help from another person eating meals?: None Help from  another person taking care of personal grooming?: A Little Help from another person toileting, which includes using toliet, bedpan, or urinal?: A Lot Help from another person bathing (including washing, rinsing, drying)?: A Lot Help from another person to put on and taking off regular upper body clothing?: A Little Help from another person to put on and taking off regular lower body clothing?: A Lot 6 Click Score: 16    End of Session Equipment Utilized During Treatment: Gait belt;Rolling walker  OT Visit Diagnosis: Unsteadiness on feet (R26.81);Muscle weakness (generalized) (M62.81)   Activity Tolerance Patient tolerated treatment well   Patient Left in bed;with call bell/phone within reach;with bed alarm set   Nurse Communication Mobility status        Time: 4861-6122 OT Time Calculation (min): 53  min  Charges: OT General Charges $OT Visit: 1 Visit OT Treatments $Self Care/Home Management : 23-37 mins $Therapeutic Activity: 23-37 mins  Gerrianne Scale, [place], OTR/L ascom [phone] 09/09/21, 10:38 AM

## 2021-09-09 NOTE — Progress Notes (Signed)
PROGRESS NOTE    Jamie Wilkins   FYB:[id number]  DOB: 12/16/1960  PCP: [facility]    DOA: 08/30/2021 LOS: 9    Brief Narrative / Hospital Course to Date:   "Jamie Wilkins is a 60 y.o. female with medical history significant for paroxysmal atrial fibrillation not on anticoagulation due to history of GI bleed, alcoholic liver cirrhosis, chronic anemia.  She was brought to the hospital at the recommendation of DSS after she was found sitting at home in her feces.  She complained of generalized weakness and poor oral intake.  She felt so weak that she was unable to get up from her chair.    She was admitted to the hospital for dehydration, hypotension, hypokalemia, hypomagnesemia, hypophosphatemia and hypocalcemia.  She was treated with IV fluids and her electrolytes were repleted.  Despite adequate IV resuscitation, blood pressure remained low.  Patient said that her blood pressure normally runs low.  She was started on midodrine for hypotension.  She also atrial fibrillation with RVR but rate control drugs were avoided because of hypotension.  Fortunately, her heart rate has improved.   She was seen by the psychiatrist for depression and suicidal ideation.  She was started on mirtazapine for depression.  She was initially placed on IVC but this has been revoked.   She was evaluated by PT and OT who recommended further rehabilitation at SNF."  Assessment & Plan   Principal Problem:   Severe major depression, single episode, without psychotic features (HCC) Active Problems:   Hypocalcemia   Hypokalemia   Hypomagnesemia   Alcoholic hepatitis without ascites   AF (paroxysmal atrial fibrillation) (HCC)   Hypotension   Depression due to physical illness   Decubitus ulcer     Acute on chronic hypotension, dehydration: BP remains soft/low but stable. Continue midodrine. Maintain MAP > 65  Paroxysmal atrial fibrillation with RVR: HR now controlled.  Rate  control agents limited by hypotension.     Depression with suicidal ideation: stable without SI or HI.   Continue mirtazapine.   IVC has been revoked.  Psychiatrist has signed off.  Alcohol use disorder: no active s/sx's of withdrawal. Counseled regarding cessation of alcohol use and its harmful health effects.  Continue thiamine and multivitamins.   Monitored on CIWA protocol.   Anemia of chronic disease, folic acid deficiency: Hbg stable.   Continue folic acid. Monitor CBC.  Hypokalemia, hypomagnesemia, hypocalcemia and hypophosphatemia: Improved with replacement.  Monitor and replace as needed.  Vitamin D deficiency: Vitamin D level is 20.3.   Continue vitamin D supplement.   Was taking vitamin D2 50,000 units once a week.     Alcoholic liver cirrhosis: Compensated, no acute issues. Monitor.   Right buttock deep tissue injury (present on admission): see below description Continue local wound care. Awaiting placement to SNF. Pressure Injury 08/31/21 Buttocks Right Deep Tissue Pressure Injury - Purple or maroon localized area of discolored intact skin or blood-filled blister due to damage of underlying soft tissue from pressure and/or shear. (Active)  08/31/21 1800  Location: Buttocks  Location Orientation: Right  Staging: Deep Tissue Pressure Injury - Purple or maroon localized area of discolored intact skin or blood-filled blister due to damage of underlying soft tissue from pressure and/or shear.  Wound Description (Comments):   Present on Admission: Yes     Patient BMI: Body mass index is 22.11 kg/m.   DVT prophylaxis: enoxaparin (LOVENOX) injection 40 mg Start: 08/30/21 2330   Diet:  Diet Orders (From admission, onward)     Start     Ordered   08/30/21 2320  Diet regular Room service appropriate? Yes; Fluid consistency: Thin  Diet effective now       Question Answer Comment  Room service appropriate? Yes   Fluid consistency: Thin      08/30/21 2320               Code Status: Full Code   Subjective 09/09/21    Patient reportedly did well working with physical therapy this morning but continues to require moderate assistance x2 and not considered safe to return home.  Patient remains agreeable to go to rehab and hopes to be able to go soon.  She denies any acute complaints reports overall feeling well.   Disposition Plan & Communication   Status is: Inpatient  Remains inpatient appropriate because: Requires SNF placement, pending   Consults, Procedures, Significant Events   Consultants:  Psychiatry  Procedures:  None  Antimicrobials:  Anti-infectives (From admission, onward)    None         Micro    Objective   Vitals:   09/08/21 1623 09/08/21 2311 09/09/21 0444 09/09/21 0806  BP: 108/69 105/80 99/69 104/75  Pulse: 90 91 82 (!) 102  Resp: 16 16 18 18   Temp: 99.7 F (37.6 C) 98.8 F (37.1 C) 98.1 F (36.7 C) 98.4 F (36.9 C)  TempSrc: Oral Oral Oral   SpO2: 97% 93% 90% 98%  Weight:      Height:        Intake/Output Summary (Last 24 hours) at 09/09/2021 1438 Last data filed at 09/09/2021 1018 Gross per 24 hour  Intake 120 ml  Output 550 ml  Net -430 ml   Filed Weights   08/30/21 1936  Weight: 62.1 kg    Physical Exam:  General exam: awake, alert, no acute distress Respiratory system: normal respiratory effort, on room air. Cardiovascular system: normal S1/S2, RRR, trace lower extremity edema.   Central nervous system: A&O x3. no gross focal neurologic deficits, normal speech Psychiatry: normal mood, congruent affect, judgement and insight appear abnormal  Labs   Data Reviewed: I have personally reviewed following labs and imaging studies  CBC: Recent Labs  Lab 09/06/21 0431  WBC 5.5  NEUTROABS 2.9  HGB 7.9*  HCT 24.3*  MCV 99.6  PLT 262   Basic Metabolic Panel: Recent Labs  Lab 09/06/21 0431  NA 138  K 5.0  CL 106  CO2 27  GLUCOSE 81  BUN 9  CREATININE 0.83  CALCIUM 7.9*   MG 1.8  PHOS 4.4   GFR: Estimated Creatinine Clearance: 66.6 mL/min (by C-G formula based on SCr of 0.83 mg/dL). Liver Function Tests: No results for input(s): AST, ALT, ALKPHOS, BILITOT, PROT, ALBUMIN in the last 168 hours. No results for input(s): LIPASE, AMYLASE in the last 168 hours. No results for input(s): AMMONIA in the last 168 hours. Coagulation Profile: No results for input(s): INR, PROTIME in the last 168 hours. Cardiac Enzymes: No results for input(s): CKTOTAL, CKMB, CKMBINDEX, TROPONINI in the last 168 hours. BNP (last 3 results) No results for input(s): PROBNP in the last 8760 hours. HbA1C: No results for input(s): HGBA1C in the last 72 hours. CBG: Recent Labs  Lab 09/02/21 2035  GLUCAP 106*   Lipid Profile: No results for input(s): CHOL, HDL, LDLCALC, TRIG, CHOLHDL, LDLDIRECT in the last 72 hours. Thyroid Function Tests: No results for input(s): TSH, T4TOTAL, FREET4, T3FREE, THYROIDAB in  the last 72 hours. Anemia Panel: No results for input(s): VITAMINB12, FOLATE, FERRITIN, TIBC, IRON, RETICCTPCT in the last 72 hours. Sepsis Labs: No results for input(s): PROCALCITON, LATICACIDVEN in the last 168 hours.  Recent Results (from the past 240 hour(s))  Resp Panel by RT-PCR (Flu A&B, Covid) Nasopharyngeal Swab     Status: None   Collection Time: 08/30/21 11:25 PM   Specimen: Nasopharyngeal Swab; Nasopharyngeal(NP) swabs in vial transport medium  Result Value Ref Range Status   SARS Coronavirus 2 by RT PCR NEGATIVE NEGATIVE Final    Comment: (NOTE) SARS-CoV-2 target nucleic acids are NOT DETECTED.  The SARS-CoV-2 RNA is generally detectable in upper respiratory specimens during the acute phase of infection. The lowest concentration of SARS-CoV-2 viral copies this assay can detect is 138 copies/mL. A negative result does not preclude SARS-Cov-2 infection and should not be used as the sole basis for treatment or other patient management decisions. A negative  result may occur with  improper specimen collection/handling, submission of specimen other than nasopharyngeal swab, presence of viral mutation(s) within the areas targeted by this assay, and inadequate number of viral copies(<138 copies/mL). A negative result must be combined with clinical observations, patient history, and epidemiological information. The expected result is Negative.  Fact Sheet for Patients:  BloggerCourse.com  Fact Sheet for Healthcare Providers:  SeriousBroker.it  This test is no t yet approved or cleared by the [place] FDA and  has been authorized for detection and/or diagnosis of SARS-CoV-2 by FDA under an Emergency Use Authorization (EUA). This EUA will remain  in effect (meaning this test can be used) for the duration of the COVID-19 declaration under Section 564(b)(1) of the Act, 21 U.S.C.section 360bbb-3(b)(1), unless the authorization is terminated  or revoked sooner.       Influenza A by PCR NEGATIVE NEGATIVE Final   Influenza B by PCR NEGATIVE NEGATIVE Final    Comment: (NOTE) The Xpert Xpress SARS-CoV-2/FLU/RSV plus assay is intended as an aid in the diagnosis of influenza from Nasopharyngeal swab specimens and should not be used as a sole basis for treatment. Nasal washings and aspirates are unacceptable for Xpert Xpress SARS-CoV-2/FLU/RSV testing.  Fact Sheet for Patients: BloggerCourse.com  Fact Sheet for Healthcare Providers: SeriousBroker.it  This test is not yet approved or cleared by the [place] FDA and has been authorized for detection and/or diagnosis of SARS-CoV-2 by FDA under an Emergency Use Authorization (EUA). This EUA will remain in effect (meaning this test can be used) for the duration of the COVID-19 declaration under Section 564(b)(1) of the Act, 21 U.S.C. section 360bbb-3(b)(1), unless the authorization is  terminated or revoked.  Performed at [facility], [address], [place], [place] [address]       Imaging Studies   No results found.   Medications   Scheduled Meds:  cholecalciferol  1,000 Units Oral Daily   enoxaparin (LOVENOX) injection  40 mg Subcutaneous Q24H   folic acid  1 mg Oral Daily   midodrine  5 mg Oral TID WC   mirtazapine  30 mg Oral QHS   multivitamin with minerals  1 tablet Oral Daily   nicotine  14 mg Transdermal Daily   polyethylene glycol  17 g Oral Daily   thiamine  100 mg Oral Daily   Or   thiamine  100 mg Intravenous Daily   Vitamin D (Ergocalciferol)  50,000 Units Oral Q7 days   Continuous Infusions:     LOS: 9 days  Time spent: 25 minutes with greater than 50% spent at bedside and coronation of care   Pennie Banter, DO [facility]  09/09/2021, 2:38 PM      If 7PM-7AM, please contact night-coverage. How to contact the [facility] Attending or Consulting provider 7A - 7P or covering provider during after hours 7P -7A, for this patient?    Check the care team in [facility] and look for a) attending/consulting [facility] provider listed and b) the [facility] team listed Log into [email or web address] and use [place]'s universal password to access. If you do not have the password, please contact the hospital operator. Locate the [facility] provider you are looking for under [facility] and page to a number that you can be directly reached. If you still have difficulty reaching the provider, please page the [facility] (Director on Call) for the Hospitalists listed on amion for assistance.

## 2021-09-09 NOTE — Progress Notes (Signed)
Patient refused all medications except Midodrine.

## 2021-09-09 NOTE — Progress Notes (Signed)
Physical Therapy Treatment Patient Details Name: Jamie Wilkins MRN: [id number] DOB: Oct 08, 1961 Today's Date: 09/09/2021   History of Present Illness 60 y.o. female who presents to ED following a DSS welfare check; pt and her neighbor called for Meals on Wheels and EMS ultimately brought pt to hospital. Medical history significant for paroxysmal atrial fibrillation not on anticoagulation due to history of GI bleed, alcoholic cirrhosis, anemia.    PT Comments    Pt was supine in bed with HOB elevated ~ 20 degrees. She agrees to PT session and is cooperative throughout. Does not endorse pin but states she has Bilateral feet burning. She requested Thereasa Parkin ask MD about neuropathy medications. Session progressed to exiting L side of bed with increased time and use of bed rails. No physical assistance required to exit bed but did require assistance to stand and ambulate a few ft with RW. Author highly recommends DC to rehab to address current deficits while assisting pt to maximal level of independence.   Recommendations for follow up therapy are one component of a multi-disciplinary discharge planning process, led by the attending physician.  Recommendations may be updated based on patient status, additional functional criteria and insurance authorization.  Follow Up Recommendations  SNF;Supervision/Assistance - 24 hour     Equipment Recommendations  Other (comment) (defer to next level of care)       Precautions / Restrictions Precautions Precautions: Fall Precaution Comments: monitor HR (pt easily gets anxious) Restrictions Weight Bearing Restrictions: No     Mobility  Bed Mobility Overal bed mobility: Needs Assistance Bed Mobility: Supine to Sit Rolling: Supervision   Supine to sit: Supervision Sit to supine: Supervision;HOB elevated   General bed mobility comments: pt was able to exit L side of bed without physical assistance however pt did require use of bed rails with HOB  slightly elevated(~15 degrees)    Transfers Overall transfer level: Needs assistance Equipment used: Rolling walker (2 wheeled) Transfers: Sit to/from Stand Sit to Stand: Min assist;Mod assist;From elevated surface Stand pivot transfers: Min assist       General transfer comment: pt was able to stand EOB 3 x prior to stand pivot to recliner. vcs for encouragament and imporved technique.  Ambulation/Gait Ambulation/Gait assistance: Min assist Gait Distance (Feet): 3 Feet Assistive device: Rolling walker (2 wheeled) Gait Pattern/deviations: Step-to pattern;Trunk flexed Gait velocity: decreased   General Gait Details: pt was able to ambulate form EOB to recliner several ft however did not want to go further distances due to c/o nueropathy/burning in feet      Balance Overall balance assessment: Needs assistance Sitting-balance support: No upper extremity supported;Feet unsupported Sitting balance-Leahy Scale: Good     Standing balance support: Bilateral upper extremity supported;During functional activity Standing balance-Leahy Scale: Fair Standing balance comment: F static standing and F with fxl mobility, P dynamic balance/reaching     Cognition Arousal/Alertness: Awake/alert Behavior During Therapy: WFL for tasks assessed/performed Overall Cognitive Status: Within Functional Limits for tasks assessed Area of Impairment: Problem solving;Awareness      Safety/Judgement: Decreased awareness of safety;Decreased awareness of deficits Awareness: Intellectual Problem Solving: Slow processing;Requires verbal cues;Requires tactile cues General Comments: pt is alert and oriented. She is motivated and pleasant throughout with Psychiatrist Other Exercises Other Exercises: OT engages pt in discussion regarding rehab potential, d/c options, safety/fall prevention considerations, and what is practical for help at home (considering she relies heavily on friends/neighbors to  CTS after every trip to the bathroom which  is not practical for the forseeable future). Pt is agreeable and engaged in discussion.        Pertinent Vitals/Pain Pain Assessment:  (c/o neuropathy) Pain Score: 7  Pain Location: B LEs Pain Descriptors / Indicators: Pins and needles;Tingling Pain Intervention(s): Limited activity within patient's tolerance;Monitored during session;Premedicated before session;Repositioned     PT Goals (current goals can now be found in the care plan section) Acute Rehab PT Goals Patient Stated Goal: rehab Progress towards PT goals: Progressing toward goals    Frequency    Min 2X/week      PT Plan Current plan remains appropriate       AM-PAC PT "6 Clicks" Mobility   Outcome Measure  Help needed turning from your back to your side while in a flat bed without using bedrails?: A Little Help needed moving from lying on your back to sitting on the side of a flat bed without using bedrails?: A Little Help needed moving to and from a bed to a chair (including a wheelchair)?: A Little Help needed standing up from a chair using your arms (e.g., wheelchair or bedside chair)?: A Lot Help needed to walk in hospital room?: A Lot Help needed climbing 3-5 steps with a railing? : A Lot 6 Click Score: 15    End of Session Equipment Utilized During Treatment: Gait belt Activity Tolerance: Patient tolerated treatment well;Other (comment);Patient limited by pain (limited by c/o BLE (feet) pain "neuropathy.") Patient left: in chair;with call bell/phone within reach Nurse Communication: Mobility status;Precautions PT Visit Diagnosis: Muscle weakness (generalized) (M62.81);Adult, failure to thrive (R62.7)     Time: 1126-1140 PT Time Calculation (min) (ACUTE ONLY): 14 min  Charges:  $Therapeutic Activity: 8-22 mins                    Jetta Lout PTA 09/09/21, 12:23 PM

## 2021-09-10 MED ORDER — LIDOCAINE 4 % EX CREA
TOPICAL_CREAM | Freq: Three times a day (TID) | CUTANEOUS | Status: DC | PRN
  Filled 2021-09-10: qty 5

## 2021-09-10 NOTE — Progress Notes (Signed)
Initial Nutrition Assessment  DOCUMENTATION CODES:  Not applicable  INTERVENTION:  Obtain updated weight.  Continue regular diet.  Add Ensure Plus High Protein po TID, each supplement provides 350 kcal and 20 grams of protein.   Add Magic cup TID with meals, each supplement provides 290 kcal and 9 grams of protein.  Continue MVI with minerals daily.  Encourage PO and supplement intake.  NUTRITION DIAGNOSIS:  Inadequate oral intake related to lethargy/confusion as evidenced by meal completion < 50%.  GOAL:  Patient will meet greater than or equal to 90% of their needs  MONITOR:  PO intake, Supplement acceptance, Labs, Weight trends, Skin, I & O's  REASON FOR ASSESSMENT:  LOS    ASSESSMENT:  60 yo female with a PMH of paroxysmal atrial fibrillation not on anticoagulation due to history of GI bleed, alcoholic liver cirrhosis, chronic anemia.  She was brought to the hospital at the recommendation of DSS after she was found sitting at home in her feces.  She complained of generalized weakness and poor oral intake.  She felt so weak that she was unable to get up from her chair.  Pt waiting to go to SNF for rehab.  Attempted to visit with pt at bedside. Pt on commode at time of RD visit.  Pt's most recent PO intakes have been poor 0-50%.  Per Epic, pt lost ~42 lbs (23.6%) in the last two months, which is significant and severe. However, it appears August 2022 weight was an error due to it being radically higher than the other weights before it.  Pt due for new weight. RD to order.  Suspect patient is malnourished to some degree, but cannot definitively diagnose at this time.  Recommend adding Ensure TID, Magic Cup TID, and continuing MVI with minerals.  Medications: reviewed; Vitamin D3, folic acid, midodrine TID, Remeron, MVI with minerals, miralax, thiamine, Vitamin D 48270 units weekly  Labs: reviewed; Vitamin D, 25-Hydroxy 20.30 (L)  NUTRITION - FOCUSED PHYSICAL  EXAM: Unable to perform - defer to follow-up  Diet Order:   Diet Order             Diet regular Room service appropriate? Yes; Fluid consistency: Thin  Diet effective now                  EDUCATION NEEDS:  Not appropriate for education at this time  Skin:  Skin Assessment: Skin Integrity Issues: Skin Integrity Issues:: DTI DTI: Buttocks  Last BM:  09/06/21 - Type 4, medium  Height:  Ht Readings from Last 1 Encounters:  08/30/21 5\' 6"  (1.676 m)   Weight:  Wt Readings from Last 1 Encounters:  08/30/21 62.1 kg   BMI:  Body mass index is 22.11 kg/m.  Estimated Nutritional Needs:  Kcal:  1800-2000 Protein:  85-100 grams Fluid:  >1.8 L  10/30/21, RD, LDN (she/her/hers) Registered Dietitian I After-Hours/Weekend Pager # in [place]

## 2021-09-10 NOTE — TOC Progression Note (Signed)
Transition of Care [facility]) - Progression Note    Patient Details  Name: Jamie Wilkins MRN: [id number] Date of Birth: [birth year]  [facility] CM/SW Contact  Allayne Butcher, RN Phone Number: 09/10/2021, 1:49 PM  Clinical Narrative:    RNCM heard back from [facility] and they will submit patient for a [employer] Case Worker.     Expected Discharge Plan: Home w Home Health Services Barriers to Discharge: Continued Medical Work up  Expected Discharge Plan and Services Expected Discharge Plan: Home w Home Health Services       Living arrangements for the past 2 months: Single Family Home                                       Social Determinants of Health (SDOH) Interventions    Readmission Risk Interventions No flowsheet data found.

## 2021-09-10 NOTE — TOC Progression Note (Signed)
Transition of Care [facility]) - Progression Note    Patient Details  Name: Jamie Wilkins MRN: [id number] Date of Birth: Aug 22, 1961  Transition of Care [facility]) CM/SW Contact  Allayne Butcher, RN Phone Number: 09/10/2021, 1:35 PM  Clinical Narrative:    No bed offers at this time.  SNF search expanded.     Expected Discharge Plan: Home w Home Health Services Barriers to Discharge: Continued Medical Work up  Expected Discharge Plan and Services Expected Discharge Plan: Home w Home Health Services       Living arrangements for the past 2 months: Single Family Home                                       Social Determinants of Health (SDOH) Interventions    Readmission Risk Interventions No flowsheet data found.

## 2021-09-10 NOTE — Progress Notes (Signed)
Occupational Therapy Treatment Patient Details Name: Jamie Wilkins MRN: [id number] DOB: 1960-12-07 Today's Date: 09/10/2021   History of present illness 60 y.o. female who presents to ED following a DSS welfare check; pt and her neighbor called for Meals on Wheels and EMS ultimately brought pt to hospital. Medical history significant for paroxysmal atrial fibrillation not on anticoagulation due to history of GI bleed, alcoholic cirrhosis, anemia.   OT comments  Pt seen for OT tx this date to f/u re: safety with ADLs/ADL mobility. Pt requires gentle motivation to participate, but is ultimately agreeable. Pt requires MOD A to CTS with RW from elevated EOB and requires CGA for fxl mobility to hallway and back. Tolerates ~6 min stand while OT assists for peri care with MOD A and applies barrier cream and changes sacral foam as old one is noted to be soiled. Pt returned to EOB sitting end of session and CNA notified that she will need a new purewick once she returned to bed. All needs met and in reach. OT will continue to follow.    Recommendations for follow up therapy are one component of a multi-disciplinary discharge planning process, led by the attending physician.  Recommendations may be updated based on patient status, additional functional criteria and insurance authorization.    Follow Up Recommendations  SNF;Supervision/Assistance - 24 hour    Equipment Recommendations  Other (comment) (defer)    Recommendations for Other Services      Precautions / Restrictions Precautions Precautions: Fall Precaution Comments: monitor HR Restrictions Weight Bearing Restrictions: No       Mobility Bed Mobility Overal bed mobility: Needs Assistance Bed Mobility: Supine to Sit     Supine to sit: Supervision          Transfers Overall transfer level: Needs assistance Equipment used: Rolling walker (2 wheeled) Transfers: Sit to/from Stand Sit to Stand: Mod assist;From elevated  surface         General transfer comment: MOD A for initial STS, MIN/MOD A for subsequent trials. from elevated bed height of ~5-6 inches.    Balance Overall balance assessment: Needs assistance Sitting-balance support: No upper extremity supported;Feet unsupported Sitting balance-Leahy Scale: Good     Standing balance support: Bilateral upper extremity supported;During functional activity Standing balance-Leahy Scale: Fair Standing balance comment: F static, P dynamic                           ADL either performed or assessed with clinical judgement   ADL Overall ADL's : Needs assistance/impaired         Upper Body Bathing: Set up;Sitting   Lower Body Bathing: Moderate assistance;Sit to/from stand Lower Body Bathing Details (indicate cue type and reason): MOD A to CTS from EOB with RW, SETUP/CGA for anterior LB bathing in standing, MOD A for posterior. Upper Body Dressing : Set up;Sitting Upper Body Dressing Details (indicate cue type and reason): EOB                 Functional mobility during ADLs: Supervision/safety;Min guard;Rolling walker (to hallway and back to bed ~25')       Vision Baseline Vision/History: 1 Wears glasses Patient Visual Report: No change from baseline     Perception     Praxis      Cognition Arousal/Alertness: Awake/alert Behavior During Therapy: WFL for tasks assessed/performed Overall Cognitive Status: Within Functional Limits for tasks assessed  General Comments: pt is alert and oriented. She is motivated and pleasant throughout with Building surveyor Other Exercises Other Exercises: OT engaes pt in bathing/dressing tasks   Shoulder Instructions       General Comments      Pertinent Vitals/ Pain       Pain Assessment: Faces Faces Pain Scale: Hurts little more Pain Location: buttocks where wound is as well as pain in feet/LEs Pain Descriptors /  Indicators: Sore Pain Intervention(s): Limited activity within patient's tolerance;Monitored during session;Repositioned;Other (comment) (assisted with peri care, application of barrier cream to buttocks, and application of clean sacral foam as previous one was soiled with urine.)  Home Living                                          Prior Functioning/Environment              Frequency  Min 2X/week        Progress Toward Goals  OT Goals(current goals can now be found in the care plan section)  Progress towards OT goals: Progressing toward goals  Acute Rehab OT Goals Patient Stated Goal: rehab OT Goal Formulation: With patient Time For Goal Achievement: 09/15/21 Potential to Achieve Goals: Good  Plan Discharge plan remains appropriate;Frequency remains appropriate    Co-evaluation                 AM-PAC OT "6 Clicks" Daily Activity     Outcome Measure   Help from another person eating meals?: None Help from another person taking care of personal grooming?: A Little Help from another person toileting, which includes using toliet, bedpan, or urinal?: A Lot Help from another person bathing (including washing, rinsing, drying)?: A Lot Help from another person to put on and taking off regular upper body clothing?: A Little Help from another person to put on and taking off regular lower body clothing?: A Lot 6 Click Score: 16    End of Session Equipment Utilized During Treatment: Gait belt;Rolling walker  OT Visit Diagnosis: Unsteadiness on feet (R26.81);Muscle weakness (generalized) (M62.81)   Activity Tolerance Patient tolerated treatment well   Patient Left in bed;with call bell/phone within reach;Other (comment) (ated EOB, CNA notified)   Nurse Communication Mobility status;Other (comment) (needs new purewick)        Time: 3543-0148 OT Time Calculation (min): 46 min  Charges: OT General Charges $OT Visit: 1 Visit OT  Treatments $Self Care/Home Management : 23-37 mins $Therapeutic Activity: 8-22 mins  Gerrianne Scale, MS, OTR/L ascom [phone] 09/10/21, 2:19 PM

## 2021-09-10 NOTE — Progress Notes (Signed)
PROGRESS NOTE    Jamie Wilkins   HEN:[id number]  DOB: 02/28/1961  PCP: [facility]    DOA: 08/30/2021 LOS: 10    Brief Narrative / Hospital Course to Date:   "Jamie Wilkins is a 60 y.o. female with medical history significant for paroxysmal atrial fibrillation not on anticoagulation due to history of GI bleed, alcoholic liver cirrhosis, chronic anemia.  She was brought to the hospital at the recommendation of DSS after she was found sitting at home in her feces.  She complained of generalized weakness and poor oral intake.  She felt so weak that she was unable to get up from her chair.    She was admitted to the hospital for dehydration, hypotension, hypokalemia, hypomagnesemia, hypophosphatemia and hypocalcemia.  She was treated with IV fluids and her electrolytes were repleted.  Despite adequate IV resuscitation, blood pressure remained low.  Patient said that her blood pressure normally runs low.  She was started on midodrine for hypotension.  She also atrial fibrillation with RVR but rate control drugs were avoided because of hypotension.  Fortunately, her heart rate has improved.   She was seen by the psychiatrist for depression and suicidal ideation.  She was started on mirtazapine for depression.  She was initially placed on IVC but this has been revoked.   She was evaluated by PT and OT who recommended further rehabilitation at SNF."  Assessment & Plan   Principal Problem:   Severe major depression, single episode, without psychotic features (HCC) Active Problems:   Hypocalcemia   Hypokalemia   Hypomagnesemia   Alcoholic hepatitis without ascites   AF (paroxysmal atrial fibrillation) (HCC)   Hypotension   Depression due to physical illness   Decubitus ulcer     Acute on chronic hypotension, dehydration: BP remains soft/low but stable. Continue midodrine. Maintain MAP > 65  Bilateral foot pain - Plantar Fascitis (less likely peripheral  neuropathy).  Tylenol PRN.  Trial of topical lidocaine.   Recommend nighttime splints for this, will see if we can get them.  Continue PT.   Paroxysmal atrial fibrillation with RVR: HR now controlled.  Rate control agents limited by hypotension.     Depression with suicidal ideation: stable without SI or HI.   Continue mirtazapine.   IVC has been revoked.  Psychiatrist has signed off.  Alcohol use disorder: no active s/sx's of withdrawal. Counseled regarding cessation of alcohol use and its harmful health effects.  Continue thiamine and multivitamins.   Monitored on CIWA protocol.   Anemia of chronic disease, folic acid deficiency: Hbg stable.   Continue folic acid. Monitor CBC.  Hypokalemia, hypomagnesemia, hypocalcemia and hypophosphatemia: Improved with replacement.  Monitor and replace as needed.  Vitamin D deficiency: Vitamin D level is 20.3.   Continue vitamin D supplement.   Was taking vitamin D2 50,000 units once a week.     Alcoholic liver cirrhosis: Compensated, no acute issues. Monitor.   Right buttock deep tissue injury (present on admission): see below description Continue local wound care. Awaiting placement to SNF. Pressure Injury 08/31/21 Buttocks Right Deep Tissue Pressure Injury - Purple or maroon localized area of discolored intact skin or blood-filled blister due to damage of underlying soft tissue from pressure and/or shear. (Active)  08/31/21 1800  Location: Buttocks  Location Orientation: Right  Staging: Deep Tissue Pressure Injury - Purple or maroon localized area of discolored intact skin or blood-filled blister due to damage of underlying soft tissue from pressure and/or shear.  Wound Description (Comments):   Present on Admission: Yes     Patient BMI: Body mass index is 22.11 kg/m.   DVT prophylaxis: enoxaparin (LOVENOX) injection 40 mg Start: 08/30/21 2330   Diet:  Diet Orders (From admission, onward)     Start     Ordered   08/30/21 2320   Diet regular Room service appropriate? Yes; Fluid consistency: Thin  Diet effective now       Question Answer Comment  Room service appropriate? Yes   Fluid consistency: Thin      08/30/21 2320              Code Status: Full Code   Subjective 09/10/21    Patient sitting up in bed when see today.  She reports severe pain in bottoms of both feet which limits her tolerance for weight-bearing and ambulation.  This improves as the day goes on, worse in the AM.  Denies sensation of burning, electrical shock like pain or pins/needles that would suggest neuropathy.  Improved yesterday after Tylenol.  No other acute complaints.  Eager to get to rehab.    Disposition Plan & Communication   Status is: Inpatient  Remains inpatient appropriate because: Requires SNF placement, pending   Consults, Procedures, Significant Events   Consultants:  Psychiatry  Procedures:  None  Antimicrobials:  Anti-infectives (From admission, onward)    None         Micro    Objective   Vitals:   09/10/21 0524 09/10/21 0829 09/10/21 0929 09/10/21 1157  BP: 103/72 92/65 97/66  103/65  Pulse: 71 82 88 81  Resp: 16 12 16 18   Temp: 98.2 F (36.8 C)  98.2 F (36.8 C) 97.9 F (36.6 C)  TempSrc: Oral  Oral   SpO2: 95% 95% 97% 93%  Weight:      Height:        Intake/Output Summary (Last 24 hours) at 09/10/2021 1534 Last data filed at 09/10/2021 0703 Gross per 24 hour  Intake --  Output 600 ml  Net -600 ml   Filed Weights   08/30/21 1936  Weight: 62.1 kg    Physical Exam:  General exam: sitting up in bed, awake, alert, no acute distress Respiratory system: CTAB, normal respiratory effort, on room air. Cardiovascular system: normal S1/S2, RRR, no lower extremity edema.   Central nervous system: A&O x3. no gross focal neurologic deficits, normal speech Extremities: plantar aspects of bilateral feet exquisitely tender on palpation without warmth swelling or erythema, plantar skin  intact Psychiatry: normal mood, congruent affect, judgement and insight appear abnormal  Labs   Data Reviewed: I have personally reviewed following labs and imaging studies  CBC: Recent Labs  Lab 09/06/21 0431  WBC 5.5  NEUTROABS 2.9  HGB 7.9*  HCT 24.3*  MCV 99.6  PLT 262   Basic Metabolic Panel: Recent Labs  Lab 09/06/21 0431  NA 138  K 5.0  CL 106  CO2 27  GLUCOSE 81  BUN 9  CREATININE 0.83  CALCIUM 7.9*  MG 1.8  PHOS 4.4   GFR: Estimated Creatinine Clearance: 66.6 mL/min (by C-G formula based on SCr of 0.83 mg/dL). Liver Function Tests: No results for input(s): AST, ALT, ALKPHOS, BILITOT, PROT, ALBUMIN in the last 168 hours. No results for input(s): LIPASE, AMYLASE in the last 168 hours. No results for input(s): AMMONIA in the last 168 hours. Coagulation Profile: No results for input(s): INR, PROTIME in the last 168 hours. Cardiac Enzymes: No results for input(s): CKTOTAL,  CKMB, CKMBINDEX, TROPONINI in the last 168 hours. BNP (last 3 results) No results for input(s): PROBNP in the last 8760 hours. HbA1C: No results for input(s): HGBA1C in the last 72 hours. CBG: No results for input(s): GLUCAP in the last 168 hours.  Lipid Profile: No results for input(s): CHOL, HDL, LDLCALC, TRIG, CHOLHDL, LDLDIRECT in the last 72 hours. Thyroid Function Tests: No results for input(s): TSH, T4TOTAL, FREET4, T3FREE, THYROIDAB in the last 72 hours. Anemia Panel: No results for input(s): VITAMINB12, FOLATE, FERRITIN, TIBC, IRON, RETICCTPCT in the last 72 hours. Sepsis Labs: No results for input(s): PROCALCITON, LATICACIDVEN in the last 168 hours.  No results found for this or any previous visit (from the past 240 hour(s)).     Imaging Studies   No results found.   Medications   Scheduled Meds:  cholecalciferol  1,000 Units Oral Daily   enoxaparin (LOVENOX) injection  40 mg Subcutaneous Q24H   folic acid  1 mg Oral Daily   midodrine  5 mg Oral TID WC    mirtazapine  30 mg Oral QHS   multivitamin with minerals  1 tablet Oral Daily   nicotine  14 mg Transdermal Daily   polyethylene glycol  17 g Oral Daily   thiamine  100 mg Oral Daily   Or   thiamine  100 mg Intravenous Daily   Vitamin D (Ergocalciferol)  50,000 Units Oral Q7 days   Continuous Infusions:     LOS: 10 days    Time spent: 25 minutes with greater than 50% spent at bedside and coronation of care   Pennie Banter, DO [facility]  09/10/2021, 3:34 PM      If 7PM-7AM, please contact night-coverage. How to contact the [facility] Attending or Consulting provider 7A - 7P or covering provider during after hours 7P -7A, for this patient?    Check the care team in [facility] and look for a) attending/consulting TR[facility] provider listed and b) the [facility] team listed Log into [email or web address] and use [facility] universal password to access. If you do not have the password, please contact the hospital operator. Locate the [facility] provider you are looking for under [facility] and page to a number that you can be directly reached. If you still have difficulty reaching the provider, please page the [facility] (Director on Call) for the Hospitalists listed on amion for assistance.

## 2021-09-11 LAB — BASIC METABOLIC PANEL
Anion gap: 5 (ref 5–15)
BUN: 9 mg/dL (ref 8–23)
CO2: 27 mmol/L (ref 22–32)
Calcium: 7.9 mg/dL — ABNORMAL LOW (ref 8.9–10.3)
Chloride: 106 mmol/L (ref 98–111)
Creatinine, Ser: 1.07 mg/dL — ABNORMAL HIGH (ref 0.44–1.00)
GFR, Estimated: 59 mL/min — ABNORMAL LOW (ref >60)
Glucose, Bld: 77 mg/dL (ref 70–99)
Potassium: 4.4 mmol/L (ref 3.5–5.1)
Sodium: 138 mmol/L (ref 135–145)

## 2021-09-11 NOTE — Progress Notes (Signed)
PROGRESS NOTE    Jamie Wilkins   RUE:[id number]  DOB: 04-Sep-1961  PCP: [facility]    DOA: 08/30/2021 LOS: 11    Brief Narrative / Hospital Course to Date:   "Jamie Wilkins is a 60 y.o. female with medical history significant for paroxysmal atrial fibrillation not on anticoagulation due to history of GI bleed, alcoholic liver cirrhosis, chronic anemia.  She was brought to the hospital at the recommendation of DSS after she was found sitting at home in her feces.  She complained of generalized weakness and poor oral intake.  She felt so weak that she was unable to get up from her chair.    She was admitted to the hospital for dehydration, hypotension, hypokalemia, hypomagnesemia, hypophosphatemia and hypocalcemia.  She was treated with IV fluids and her electrolytes were repleted.  Despite adequate IV resuscitation, blood pressure remained low.  Patient said that her blood pressure normally runs low.  She was started on midodrine for hypotension.  She also atrial fibrillation with RVR but rate control drugs were avoided because of hypotension.  Fortunately, her heart rate has improved.   She was seen by the psychiatrist for depression and suicidal ideation.  She was started on mirtazapine for depression.  She was initially placed on IVC but this has been revoked.   She was evaluated by PT and OT who recommended further rehabilitation at SNF."  Assessment & Plan   Principal Problem:   Severe major depression, single episode, without psychotic features (HCC) Active Problems:   Hypocalcemia   Hypokalemia   Hypomagnesemia   Alcoholic hepatitis without ascites   AF (paroxysmal atrial fibrillation) (HCC)   Hypotension   Depression due to physical illness   Decubitus ulcer     Acute on chronic hypotension, dehydration: BP remains soft/low but stable. Continue midodrine. Maintain MAP > 65  Bilateral foot pain - Plantar Fascitis (less likely peripheral  neuropathy).  Tylenol PRN.  Trial of topical lidocaine.   Recommend nighttime splints for this, will see if we can get them.  Continue PT.   Paroxysmal atrial fibrillation with RVR: HR now controlled.  Rate control agents limited by hypotension.     Depression with suicidal ideation: stable without SI or HI.   Continue mirtazapine.   IVC has been revoked.  Psychiatrist has signed off.  Alcohol use disorder: no active s/sx's of withdrawal. Counseled regarding cessation of alcohol use and its harmful health effects.  Continue thiamine and multivitamins.   Monitored on CIWA protocol.   Anemia of chronic disease, folic acid deficiency: Hbg stable.   Continue folic acid. Monitor CBC.  Hypokalemia, hypomagnesemia, hypocalcemia and hypophosphatemia: Improved with replacement.  Monitor and replace as needed.  Vitamin D deficiency: Vitamin D level is 20.3.   Continue vitamin D supplement.   Was taking vitamin D2 50,000 units once a week.     Alcoholic liver cirrhosis: Compensated, no acute issues. Monitor.   Right buttock deep tissue injury (present on admission): see below description Continue local wound care. Awaiting placement to SNF. Pressure Injury 08/31/21 Buttocks Right Deep Tissue Pressure Injury - Purple or maroon localized area of discolored intact skin or blood-filled blister due to damage of underlying soft tissue from pressure and/or shear. (Active)  08/31/21 1800  Location: Buttocks  Location Orientation: Right  Staging: Deep Tissue Pressure Injury - Purple or maroon localized area of discolored intact skin or blood-filled blister due to damage of underlying soft tissue from pressure and/or shear.  Wound Description (Comments):   Present on Admission: Yes     Patient BMI: Body mass index is 22.11 kg/m.   DVT prophylaxis: enoxaparin (LOVENOX) injection 40 mg Start: 08/30/21 2330   Diet:  Diet Orders (From admission, onward)     Start     Ordered   08/30/21 2320   Diet regular Room service appropriate? Yes; Fluid consistency: Thin  Diet effective now       Question Answer Comment  Room service appropriate? Yes   Fluid consistency: Thin      08/30/21 2320              Code Status: Full Code   Subjective 09/11/21    Patient sitting up in recliner chair when seen this morning.  She reports the pain in her feet is a little bit better today.  Denies any acute complaints.  No acute events reported overnight.   Disposition Plan & Communication   Status is: Inpatient  Remains inpatient appropriate because: Requires SNF placement, pending   Consults, Procedures, Significant Events   Consultants:  Psychiatry  Procedures:  None  Antimicrobials:  Anti-infectives (From admission, onward)    None         Micro    Objective   Vitals:   09/11/21 0000 09/11/21 0531 09/11/21 0744 09/11/21 1046  BP: 99/64 94/71 92/62  93/66  Pulse: 81 82 83 80  Resp: 16 14  16   Temp: 98.4 F (36.9 C) 97.9 F (36.6 C) 98.5 F (36.9 C) 97.7 F (36.5 C)  TempSrc: Oral Oral Oral Oral  SpO2: 94% 98% 97% 100%  Weight:      Height:        Intake/Output Summary (Last 24 hours) at 09/11/2021 1417 Last data filed at 09/11/2021 1403 Gross per 24 hour  Intake 1020 ml  Output 750 ml  Net 270 ml   Filed Weights   08/30/21 1936  Weight: 62.1 kg    Physical Exam:  General exam: Sitting in recliner chair, awake, alert, no acute distress Respiratory system: Lungs clear bilaterally, no wheezes rales or rhonchi, normal respiratory effort, on room air Cardiovascular system: Regular rate and rhythm, no lower extremity edema.   Central nervous system: A&O x3. no gross focal neurologic deficits, normal speech The psychiatry: normal mood, congruent affect, judgement and insight appear abnormal  Labs   Data Reviewed: I have personally reviewed following labs and imaging studies  CBC: Recent Labs  Lab 09/06/21 0431  WBC 5.5  NEUTROABS 2.9  HGB  7.9*  HCT 24.3*  MCV 99.6  PLT 262   Basic Metabolic Panel: Recent Labs  Lab 09/06/21 0431 09/11/21 0404  NA 138 138  K 5.0 4.4  CL 106 106  CO2 27 27  GLUCOSE 81 77  BUN 9 9  CREATININE 0.83 1.07*  CALCIUM 7.9* 7.9*  MG 1.8  --   PHOS 4.4  --    GFR: Estimated Creatinine Clearance: 51.7 mL/min (A) (by C-G formula based on SCr of 1.07 mg/dL (H)). Liver Function Tests: No results for input(s): AST, ALT, ALKPHOS, BILITOT, PROT, ALBUMIN in the last 168 hours. No results for input(s): LIPASE, AMYLASE in the last 168 hours. No results for input(s): AMMONIA in the last 168 hours. Coagulation Profile: No results for input(s): INR, PROTIME in the last 168 hours. Cardiac Enzymes: No results for input(s): CKTOTAL, CKMB, CKMBINDEX, TROPONINI in the last 168 hours. BNP (last 3 results) No results for input(s): PROBNP in the last 8760 hours.  HbA1C: No results for input(s): HGBA1C in the last 72 hours. CBG: No results for input(s): GLUCAP in the last 168 hours.  Lipid Profile: No results for input(s): CHOL, HDL, LDLCALC, TRIG, CHOLHDL, LDLDIRECT in the last 72 hours. Thyroid Function Tests: No results for input(s): TSH, T4TOTAL, FREET4, T3FREE, THYROIDAB in the last 72 hours. Anemia Panel: No results for input(s): VITAMINB12, FOLATE, FERRITIN, TIBC, IRON, RETICCTPCT in the last 72 hours. Sepsis Labs: No results for input(s): PROCALCITON, LATICACIDVEN in the last 168 hours.  No results found for this or any previous visit (from the past 240 hour(s)).     Imaging Studies   No results found.   Medications   Scheduled Meds:  cholecalciferol  1,000 Units Oral Daily   enoxaparin (LOVENOX) injection  40 mg Subcutaneous Q24H   folic acid  1 mg Oral Daily   midodrine  5 mg Oral TID WC   mirtazapine  30 mg Oral QHS   multivitamin with minerals  1 tablet Oral Daily   nicotine  14 mg Transdermal Daily   polyethylene glycol  17 g Oral Daily   thiamine  100 mg Oral Daily   Or    thiamine  100 mg Intravenous Daily   Vitamin D (Ergocalciferol)  50,000 Units Oral Q7 days   Continuous Infusions:     LOS: 11 days    Time spent: 20 minutes   Pennie Banter, DO [facility]  09/11/2021, 2:17 PM      If 7PM-7AM, please contact night-coverage. How to contact the [facility] Attending or Consulting provider 7A - 7P or covering provider during after hours 7P -7A, for this patient?    Check the care team in [facility] and look for a) attending/consulting TRH provider listed and b) the [facility] team listed Log into [email or web address] and use [place] universal password to access. If you do not have the password, please contact the hospital operator. Locate the [facility] provider you are looking for under [facility] and page to a number that you can be directly reached. If you still have difficulty reaching the provider, please page the [facility] (Director on Call) for the Hospitalists listed on amion for assistance.

## 2021-09-12 LAB — BLOOD GAS, VENOUS
Acid-Base Excess: 2.8 mmol/L — ABNORMAL HIGH (ref 0.0–2.0)
Bicarbonate: 27.2 mmol/L (ref 20.0–28.0)
O2 Saturation: 98.8 %
Patient temperature: 37
pCO2, Ven: 40 mmHg — ABNORMAL LOW (ref 44.0–60.0)
pH, Ven: 7.44 — ABNORMAL HIGH (ref 7.250–7.430)
pO2, Ven: 121 mmHg — ABNORMAL HIGH (ref 32.0–45.0)

## 2021-09-12 LAB — AMMONIA: Ammonia: 38 umol/L — ABNORMAL HIGH (ref 9–35)

## 2021-09-12 NOTE — Progress Notes (Signed)
Patient very confused overnight. Patient repeatedly tried to get out of bed stating that she was leaving to go home. Educated patient that she was in the hospital and that she is too weak to walk out. Patient became very agitated and kept saying that she needed to go outside. Patient refused to ambulate to bathroom or purewick overnight. Educated the patient on the importance of utilizing the call bell for any needs and that the staff is here to help. Patient again was agitated with education. Patient was bathed with all linens changed, call bell within reach and bed alarm on. All needs addressed.

## 2021-09-12 NOTE — Progress Notes (Signed)
Physical Therapy Treatment Patient Details Name: Jamie Wilkins MRN: [id number] DOB: 1960/12/28 Today's Date: 09/12/2021   History of Present Illness 60 y.o. female who presents to ED following a DSS welfare check; pt and her neighbor called for Meals on Wheels and EMS ultimately brought pt to hospital. Medical history significant for paroxysmal atrial fibrillation not on anticoagulation due to history of GI bleed, alcoholic cirrhosis, anemia.    PT Comments    Ready for session.  Stated she has been walking to/from bathroom with staff this admission but when asked to stand to RW she is unable and fearful.  Confirmed with tech that she has not been walking.  She did sit EOB for LE AROM and upper body bathing before asking for bedpan.  Returned to supine and pan placed.  Tech aware.   Recommendations for follow up therapy are one component of a multi-disciplinary discharge planning process, led by the attending physician.  Recommendations may be updated based on patient status, additional functional criteria and insurance authorization.  Follow Up Recommendations  SNF;Supervision/Assistance - 24 hour     Equipment Recommendations  Other (comment)    Recommendations for Other Services       Precautions / Restrictions Precautions Precautions: Fall Precaution Comments: monitor HR Restrictions Weight Bearing Restrictions: No     Mobility  Bed Mobility Overal bed mobility: Modified Independent Bed Mobility: Supine to Sit;Sit to Supine Rolling: Supervision   Supine to sit: Supervision Sit to supine: Supervision   General bed mobility comments: R side on bed supine <-> sit with supervision - no physical assist    Transfers                 General transfer comment: attempts but stated fear is a barrier  Ambulation/Gait                 Stairs             Wheelchair Mobility    Modified Rankin (Stroke Patients Only)       Balance Overall  balance assessment: Needs assistance   Sitting balance-Leahy Scale: Good                                      Cognition Arousal/Alertness: Awake/alert Behavior During Therapy: WFL for tasks assessed/performed Overall Cognitive Status: Within Functional Limits for tasks assessed                                        Exercises      General Comments        Pertinent Vitals/Pain Pain Assessment: No/denies pain    Home Living                      Prior Function            PT Goals (current goals can now be found in the care plan section) Progress towards PT goals: Progressing toward goals    Frequency    Min 2X/week      PT Plan Current plan remains appropriate    Co-evaluation              AM-PAC PT "6 Clicks" Mobility   Outcome Measure  Help needed turning from your back to your side while in a flat bed without  using bedrails?: A Little Help needed moving from lying on your back to sitting on the side of a flat bed without using bedrails?: None Help needed moving to and from a bed to a chair (including a wheelchair)?: A Lot Help needed standing up from a chair using your arms (e.g., wheelchair or bedside chair)?: A Lot Help needed to walk in hospital room?: Total Help needed climbing 3-5 steps with a railing? : Total 6 Click Score: 13    End of Session Equipment Utilized During Treatment: Gait belt Activity Tolerance: Patient tolerated treatment well Patient left: in bed;with call bell/phone within reach;with bed alarm set;Other (comment) (on bedpan RN aware) Nurse Communication: Mobility status;Precautions PT Visit Diagnosis: Muscle weakness (generalized) (M62.81);Adult, failure to thrive (R62.7)     Time: 8299-3716 PT Time Calculation (min) (ACUTE ONLY): 17 min  Charges:  $Therapeutic Exercise: 8-22 mins                     Danielle Dess, PTA 09/12/21, 11:52 AM

## 2021-09-12 NOTE — Progress Notes (Signed)
PROGRESS NOTE    Jamie Wilkins   STM:[id number]  DOB: 04-17-1961  PCP: [facility]    DOA: 08/30/2021 LOS: 12    Brief Narrative / Hospital Course to Date:   "Jamie Wilkins is a 60 y.o. female with medical history significant for paroxysmal atrial fibrillation not on anticoagulation due to history of GI bleed, alcoholic liver cirrhosis, chronic anemia.  She was brought to the hospital at the recommendation of DSS after she was found sitting at home in her feces.  She complained of generalized weakness and poor oral intake.  She felt so weak that she was unable to get up from her chair.    She was admitted to the hospital for dehydration, hypotension, hypokalemia, hypomagnesemia, hypophosphatemia and hypocalcemia.  She was treated with IV fluids and her electrolytes were repleted.  Despite adequate IV resuscitation, blood pressure remained low.  Patient said that her blood pressure normally runs low.  She was started on midodrine for hypotension.  She also atrial fibrillation with RVR but rate control drugs were avoided because of hypotension.  Fortunately, her heart rate has improved.   She was seen by the psychiatrist for depression and suicidal ideation.  She was started on mirtazapine for depression.  She was initially placed on IVC but this has been revoked.   She was evaluated by PT and OT who recommended further rehabilitation at SNF."  Assessment & Plan   Principal Problem:   Severe major depression, single episode, without psychotic features (HCC) Active Problems:   Hypocalcemia   Hypokalemia   Hypomagnesemia   Alcoholic hepatitis without ascites   AF (paroxysmal atrial fibrillation) (HCC)   Hypotension   Depression due to physical illness   Decubitus ulcer   Acute metabolic encephalopathy versus hospital delirium -10/22-23: Patient with increased confusion since yesterday afternoon and overnight.  She seems still confused a little this  morning. --Check VBG and ammonia levels --Patient is afebrile without leukocytosis or any signs symptoms of infection.   --Monitor clinically, further evaluation if we see signs or symptoms of infection. --Delirium precautions  Acute on chronic hypotension, dehydration: BP remains soft/low but stable. Continue midodrine. Maintain MAP > 65  Bilateral foot pain - Plantar Fascitis (less likely peripheral neuropathy).  Tylenol PRN.  Trial of topical lidocaine.   Recommend nighttime splints for this, will see if we can get them.  Continue PT.   Paroxysmal atrial fibrillation with RVR: HR now controlled.  Rate control agents limited by hypotension.     Depression with suicidal ideation: stable without SI or HI.   Continue mirtazapine.   IVC has been revoked.  Psychiatrist has signed off.  Alcohol use disorder: no active s/sx's of withdrawal. Counseled regarding cessation of alcohol use and its harmful health effects.  Continue thiamine and multivitamins.   Monitored on CIWA protocol.   Anemia of chronic disease, folic acid deficiency: Hbg stable.   Continue folic acid. Monitor CBC.  Hypokalemia, hypomagnesemia, hypocalcemia and hypophosphatemia: Improved with replacement.  Monitor and replace as needed.  Vitamin D deficiency: Vitamin D level is 20.3.   Continue vitamin D supplement.   Was taking vitamin D2 50,000 units once a week.     Alcoholic liver cirrhosis: Compensated, no acute issues. Monitor.   Right buttock deep tissue injury (present on admission): see below description Continue local wound care. Awaiting placement to SNF. Pressure Injury 08/31/21 Buttocks Right Deep Tissue Pressure Injury - Purple or maroon localized area of discolored intact skin  or blood-filled blister due to damage of underlying soft tissue from pressure and/or shear. (Active)  08/31/21 1800  Location: Buttocks  Location Orientation: Right  Staging: Deep Tissue Pressure Injury - Purple or maroon  localized area of discolored intact skin or blood-filled blister due to damage of underlying soft tissue from pressure and/or shear.  Wound Description (Comments):   Present on Admission: Yes     Patient BMI: Body mass index is 22.11 kg/m.   DVT prophylaxis: enoxaparin (LOVENOX) injection 40 mg Start: 08/30/21 2330   Diet:  Diet Orders (From admission, onward)     Start     Ordered   08/30/21 2320  Diet regular Room service appropriate? Yes; Fluid consistency: Thin  Diet effective now       Question Answer Comment  Room service appropriate? Yes   Fluid consistency: Thin      08/30/21 2320              Code Status: Full Code   Subjective 09/12/21    Patient awake sitting up in bed when seen this morning.  She was reportedly more confused overnight.  She thought she never saw her doctor yesterday.  I reminded her that I am her doctor and saw her yesterday sitting up in the recliner.  She wants to be discharged to rehab and asked if we can send her to [facility] today because they are hiring.  She seems mildly confused still this morning but overall appropriate.  Denies any fevers chills, cough congestion, sore throat dysuria or other complaints.   Disposition Plan & Communication   Status is: Inpatient  Remains inpatient appropriate because: Requires SNF placement, pending   Consults, Procedures, Significant Events   Consultants:  Psychiatry  Procedures:  None  Antimicrobials:  Anti-infectives (From admission, onward)    None         Micro    Objective   Vitals:   09/12/21 0030 09/12/21 0635 09/12/21 0744 09/12/21 1139  BP: 103/64 99/66 92/68  95/64  Pulse: (!) 106 87 88 69  Resp: 16 18 16 16   Temp: 98.4 F (36.9 C) 97.6 F (36.4 C) 98.3 F (36.8 C) 98.1 F (36.7 C)  TempSrc: Oral  Oral Oral  SpO2: 94% 96% 95% 100%  Weight:      Height:        Intake/Output Summary (Last 24 hours) at 09/12/2021 1409 Last data filed at 09/12/2021  1023 Gross per 24 hour  Intake 480 ml  Output 850 ml  Net -370 ml   Filed Weights   08/30/21 1936  Weight: 62.1 kg    Physical Exam:  General exam: sitting up in bed, awake, alert, no acute distress, mild confusion Respiratory system: normal respiratory effort, on room air Cardiovascular system: Regular rate and rhythm, no lower extremity edema.   Central nervous system: A&O x3. no gross focal neurologic deficits, normal speech Psychiatry: normal mood, congruent affect, abnormal judgement and insight  Labs   Data Reviewed: I have personally reviewed following labs and imaging studies  CBC: Recent Labs  Lab 09/06/21 0431  WBC 5.5  NEUTROABS 2.9  HGB 7.9*  HCT 24.3*  MCV 99.6  PLT 262   Basic Metabolic Panel: Recent Labs  Lab 09/06/21 0431 09/11/21 0404  NA 138 138  K 5.0 4.4  CL 106 106  CO2 27 27  GLUCOSE 81 77  BUN 9 9  CREATININE 0.83 1.07*  CALCIUM 7.9* 7.9*  MG 1.8  --  PHOS 4.4  --    GFR: Estimated Creatinine Clearance: 51.7 mL/min (A) (by C-G formula based on SCr of 1.07 mg/dL (H)). Liver Function Tests: No results for input(s): AST, ALT, ALKPHOS, BILITOT, PROT, ALBUMIN in the last 168 hours. No results for input(s): LIPASE, AMYLASE in the last 168 hours. Recent Labs  Lab 09/12/21 0802  AMMONIA 38*   Coagulation Profile: No results for input(s): INR, PROTIME in the last 168 hours. Cardiac Enzymes: No results for input(s): CKTOTAL, CKMB, CKMBINDEX, TROPONINI in the last 168 hours. BNP (last 3 results) No results for input(s): PROBNP in the last 8760 hours. HbA1C: No results for input(s): HGBA1C in the last 72 hours. CBG: No results for input(s): GLUCAP in the last 168 hours.  Lipid Profile: No results for input(s): CHOL, HDL, LDLCALC, TRIG, CHOLHDL, LDLDIRECT in the last 72 hours. Thyroid Function Tests: No results for input(s): TSH, T4TOTAL, FREET4, T3FREE, THYROIDAB in the last 72 hours. Anemia Panel: No results for input(s):  VITAMINB12, FOLATE, FERRITIN, TIBC, IRON, RETICCTPCT in the last 72 hours. Sepsis Labs: No results for input(s): PROCALCITON, LATICACIDVEN in the last 168 hours.  No results found for this or any previous visit (from the past 240 hour(s)).     Imaging Studies   No results found.   Medications   Scheduled Meds:  cholecalciferol  1,000 Units Oral Daily   enoxaparin (LOVENOX) injection  40 mg Subcutaneous Q24H   folic acid  1 mg Oral Daily   midodrine  5 mg Oral TID WC   mirtazapine  30 mg Oral QHS   multivitamin with minerals  1 tablet Oral Daily   nicotine  14 mg Transdermal Daily   polyethylene glycol  17 g Oral Daily   thiamine  100 mg Oral Daily   Or   thiamine  100 mg Intravenous Daily   Vitamin D (Ergocalciferol)  50,000 Units Oral Q7 days   Continuous Infusions:     LOS: 12 days    Time spent: 25 minutes with > 50% spent at bedside and in coordination of care    Pennie Banter, DO [facility]  09/12/2021, 2:09 PM      If 7PM-7AM, please contact night-coverage. How to contact the [facility] Attending or Consulting provider 7A - 7P or covering provider during after hours 7P -7A, for this patient?    Check the care team in [facility] and look for a) attending/consulting TRH provider listed and b) the [facility] team listed Log into [email or web address] and use [place]'s universal password to access. If you do not have the password, please contact the hospital operator. Locate the [facility] provider you are looking for under [facility] and page to a number that you can be directly reached. If you still have difficulty reaching the provider, please page the [facility] (Director on Call) for the Hospitalists listed on amion for assistance.

## 2021-09-13 LAB — CBC
HCT: 24.6 % — ABNORMAL LOW (ref 36.0–46.0)
Hemoglobin: 7.8 g/dL — ABNORMAL LOW (ref 12.0–15.0)
MCH: 32 pg (ref 26.0–34.0)
MCHC: 31.7 g/dL (ref 30.0–36.0)
MCV: 100.8 fL — ABNORMAL HIGH (ref 80.0–100.0)
Platelets: 235 10*3/uL (ref 150–400)
RBC: 2.44 MIL/uL — ABNORMAL LOW (ref 3.87–5.11)
RDW: 18.5 % — ABNORMAL HIGH (ref 11.5–15.5)
WBC: 3.8 10*3/uL — ABNORMAL LOW (ref 4.0–10.5)
nRBC: 0 % (ref 0.0–0.2)

## 2021-09-13 NOTE — TOC Progression Note (Signed)
Transition of Care [facility]) - Progression Note    Patient Details  Name: Jamie Wilkins MRN: [id number] Date of Birth: [birth year]  Transition of Care [facility]) CM/SW Contact  Allayne Butcher, RN Phone Number: 09/13/2021, 3:35 PM  Clinical Narrative:    No bed offers received at this time.  Patient is doing better with therapy and can walk but is still max assist to stand.  Once she is standing she can walk.  TOC will cont to follow.    Expected Discharge Plan: Home w Home Health Services Barriers to Discharge: Continued Medical Work up  Expected Discharge Plan and Services Expected Discharge Plan: Home w Home Health Services       Living arrangements for the past 2 months: Single Family Home                                       Social Determinants of Health (SDOH) Interventions    Readmission Risk Interventions No flowsheet data found.

## 2021-09-13 NOTE — Progress Notes (Signed)
PROGRESS NOTE    Jamie Wilkins   PJA:[id number]  DOB: 22-Aug-1961  PCP: [facility]    DOA: 08/30/2021 LOS: 13    Brief Narrative / Hospital Course to Date:   "Jamie Wilkins is a 60 y.o. female with medical history significant for paroxysmal atrial fibrillation not on anticoagulation due to history of GI bleed, alcoholic liver cirrhosis, chronic anemia.  She was brought to the hospital at the recommendation of DSS after she was found sitting at home in her feces.  She complained of generalized weakness and poor oral intake.  She felt so weak that she was unable to get up from her chair.    She was admitted to the hospital for dehydration, hypotension, hypokalemia, hypomagnesemia, hypophosphatemia and hypocalcemia.  She was treated with IV fluids and her electrolytes were repleted.  Despite adequate IV resuscitation, blood pressure remained low.  Patient said that her blood pressure normally runs low.  She was started on midodrine for hypotension.  She also atrial fibrillation with RVR but rate control drugs were avoided because of hypotension.  Fortunately, her heart rate has improved.   She was seen by the psychiatrist for depression and suicidal ideation.  She was started on mirtazapine for depression.  She was initially placed on IVC but this has been revoked.   She was evaluated by PT and OT who recommended further rehabilitation at SNF."  Assessment & Plan   Principal Problem:   Severe major depression, single episode, without psychotic features (HCC) Active Problems:   Hypocalcemia   Hypokalemia   Hypomagnesemia   Alcoholic hepatitis without ascites   AF (paroxysmal atrial fibrillation) (HCC)   Hypotension   Depression due to physical illness   Decubitus ulcer   Acute metabolic encephalopathy - likely hospital delirium -10/22-23: Patient with increased confusion  10/24: Confusion reported again overnight but patient fully oriented and  interacting appropriately this morning VBG without hypercapnia and ammonia only minimally elevated, not enough to cause confusion. --Patient is afebrile without leukocytosis or any signs symptoms of infection.   --Monitor clinically, further evaluation if we see signs or symptoms of infection. --Delirium precautions  Acute on chronic hypotension, dehydration: BP remains soft/low but stable. Continue midodrine. Maintain MAP > 65  Bilateral foot pain - Plantar Fascitis (less likely peripheral neuropathy).  Tylenol PRN.  Trial of topical lidocaine.   Recommend nighttime splints for this, will see if we can get them.  Continue PT.   Paroxysmal atrial fibrillation with RVR: HR now controlled.  Rate control agents limited by hypotension.     Depression with suicidal ideation: stable without SI or HI.   Continue mirtazapine.   IVC has been revoked.  Psychiatrist has signed off.  Alcohol use disorder: no active s/sx's of withdrawal. Counseled regarding cessation of alcohol use and its harmful health effects.  Continue thiamine and multivitamins.   Monitored on CIWA protocol.   Anemia of chronic disease, folic acid deficiency: Hbg stable.   Continue folic acid. Monitor CBC.  Hypokalemia, hypomagnesemia, hypocalcemia and hypophosphatemia: Improved with replacement.  Monitor and replace as needed.  Vitamin D deficiency: Vitamin D level is 20.3.   Continue vitamin D supplement.   Was taking vitamin D2 50,000 units once a week.     Alcoholic liver cirrhosis: Compensated, no acute issues. Monitor.   Right buttock deep tissue injury (present on admission): see below description Continue local wound care. Awaiting placement to SNF. Pressure Injury 08/31/21 Buttocks Right Deep Tissue Pressure Injury -  Purple or maroon localized area of discolored intact skin or blood-filled blister due to damage of underlying soft tissue from pressure and/or shear. (Active)  08/31/21 1800  Location: Buttocks   Location Orientation: Right  Staging: Deep Tissue Pressure Injury - Purple or maroon localized area of discolored intact skin or blood-filled blister due to damage of underlying soft tissue from pressure and/or shear.  Wound Description (Comments):   Present on Admission: Yes     Patient BMI: Body mass index is 22.11 kg/m.   DVT prophylaxis: enoxaparin (LOVENOX) injection 40 mg Start: 08/30/21 2330   Diet:  Diet Orders (From admission, onward)     Start     Ordered   08/30/21 2320  Diet regular Room service appropriate? Yes; Fluid consistency: Thin  Diet effective now       Question Answer Comment  Room service appropriate? Yes   Fluid consistency: Thin      08/30/21 2320              Code Status: Full Code   Subjective 09/13/21    Patient awake sitting up in bed when seen today.  Nursing staff reported patient again was quite confused overnight and early this morning.  Patient is oriented x3 for me during encounter.  She reports feeling well.  Foot pain is much better and she is able to walk.  Denies any symptoms including fever chills, chest pain shortness of breath, nausea vomiting or other complaints   Disposition Plan & Communication   Status is: Inpatient  Remains inpatient appropriate because: Requires SNF placement, pending   Consults, Procedures, Significant Events   Consultants:  Psychiatry  Procedures:  None  Antimicrobials:  Anti-infectives (From admission, onward)    None         Micro    Objective   Vitals:   09/13/21 0016 09/13/21 0353 09/13/21 0742 09/13/21 1111  BP: 100/71 114/76 113/73 104/76  Pulse: 81 80 70 81  Resp: 16 18 16 16   Temp: 98.6 F (37 C) 98 F (36.7 C) 98.6 F (37 C) 98.4 F (36.9 C)  TempSrc: Oral Oral Oral Oral  SpO2: 96% 95% 100% 94%  Weight:      Height:        Intake/Output Summary (Last 24 hours) at 09/13/2021 1631 Last data filed at 09/13/2021 1411 Gross per 24 hour  Intake 480 ml  Output  500 ml  Net -20 ml   Filed Weights   08/30/21 1936  Weight: 62.1 kg    Physical Exam:  General exam: sitting up in bed, awake, alert, no acute distress, mild confusion Respiratory system: normal respiratory effort, on room air Cardiovascular system: Regular rate and rhythm, no lower extremity edema.   Central nervous system: A&O x3. no gross focal neurologic deficits, normal speech Psychiatry: normal mood, congruent affect, abnormal judgement and insight  Labs   Data Reviewed: I have personally reviewed following labs and imaging studies  CBC: Recent Labs  Lab 09/13/21 0436  WBC 3.8*  HGB 7.8*  HCT 24.6*  MCV 100.8*  PLT 235   Basic Metabolic Panel: Recent Labs  Lab 09/11/21 0404  NA 138  K 4.4  CL 106  CO2 27  GLUCOSE 77  BUN 9  CREATININE 1.07*  CALCIUM 7.9*   GFR: Estimated Creatinine Clearance: 51.7 mL/min (A) (by C-G formula based on SCr of 1.07 mg/dL (H)). Liver Function Tests: No results for input(s): AST, ALT, ALKPHOS, BILITOT, PROT, ALBUMIN in the last 168 hours.  No results for input(s): LIPASE, AMYLASE in the last 168 hours. Recent Labs  Lab 09/12/21 0802  AMMONIA 38*   Coagulation Profile: No results for input(s): INR, PROTIME in the last 168 hours. Cardiac Enzymes: No results for input(s): CKTOTAL, CKMB, CKMBINDEX, TROPONINI in the last 168 hours. BNP (last 3 results) No results for input(s): PROBNP in the last 8760 hours. HbA1C: No results for input(s): HGBA1C in the last 72 hours. CBG: No results for input(s): GLUCAP in the last 168 hours.  Lipid Profile: No results for input(s): CHOL, HDL, LDLCALC, TRIG, CHOLHDL, LDLDIRECT in the last 72 hours. Thyroid Function Tests: No results for input(s): TSH, T4TOTAL, FREET4, T3FREE, THYROIDAB in the last 72 hours. Anemia Panel: No results for input(s): VITAMINB12, FOLATE, FERRITIN, TIBC, IRON, RETICCTPCT in the last 72 hours. Sepsis Labs: No results for input(s): PROCALCITON, LATICACIDVEN in  the last 168 hours.  No results found for this or any previous visit (from the past 240 hour(s)).     Imaging Studies   No results found.   Medications   Scheduled Meds:  cholecalciferol  1,000 Units Oral Daily   enoxaparin (LOVENOX) injection  40 mg Subcutaneous Q24H   folic acid  1 mg Oral Daily   midodrine  5 mg Oral TID WC   mirtazapine  30 mg Oral QHS   multivitamin with minerals  1 tablet Oral Daily   nicotine  14 mg Transdermal Daily   polyethylene glycol  17 g Oral Daily   thiamine  100 mg Oral Daily   Or   thiamine  100 mg Intravenous Daily   Vitamin D (Ergocalciferol)  50,000 Units Oral Q7 days   Continuous Infusions:     LOS: 13 days    Time spent: 25 minutes with > 50% spent at bedside and in coordination of care    Pennie Banter, DO [facility]  09/13/2021, 4:31 PM      If 7PM-7AM, please contact night-coverage. How to contact the [facility] Attending or Consulting provider 7A - 7P or covering provider during after hours 7P -7A, for this patient?    Check the care team in [facility] and look for a) attending/consulting TRH provider listed and b) the [facility] team listed Log into [email or web address] and use [place]'s universal password to access. If you do not have the password, please contact the hospital operator. Locate the [facility] provider you are looking for under [facility] and page to a number that you can be directly reached. If you still have difficulty reaching the provider, please page the [facility] (Director on Call) for the Hospitalists listed on amion for assistance.

## 2021-09-13 NOTE — Progress Notes (Signed)
Occupational Therapy Treatment Patient Details Name: CAMRYNN MCCLINTIC MRN: [id number] DOB: November 01, 1961 Today's Date: 09/13/2021   History of present illness 60 y.o. female who presents to ED following a DSS welfare check; pt and her neighbor called for Meals on Wheels and EMS ultimately brought pt to hospital. Medical history significant for paroxysmal atrial fibrillation not on anticoagulation due to history of GI bleed, alcoholic cirrhosis, anemia.   OT comments  Pt seen for OT treatment on this date. Upon arrival to room, pt asleep in bed however easily awoken. Pt agreeable to OT tx, and requesting to walk in hallway (note: pt reports that she walked twice this date, once with PT & once with NT assisting). Pt continues to requires MOD A for functional transfers and MOD A for sit>stand LB ADLs, however is progressing toward goals and was able to perform x1 standing grooming task and functional mobility of short household distances (~34ft) with RW and MIN GUARD d/t decreased activity tolerance and strength. Pt continues to require significant assistance for functional transfers and LB ADLs, and would benefit from STR to maximize return to PLOF and minimize risk of future falls, injury, caregiver burden, and readmission. Will continue to follow POC and re-evaluate goals at next OT session.    Recommendations for follow up therapy are one component of a multi-disciplinary discharge planning process, led by the attending physician.  Recommendations may be updated based on patient status, additional functional criteria and insurance authorization.    Follow Up Recommendations  Skilled nursing-short term rehab (<3 hours/day)    Assistance Recommended at Discharge Frequent or constant Supervision/Assistance  Equipment Recommendations  Other (comment) (defer to next venue of care)       Precautions / Restrictions Precautions Precautions: Fall Restrictions Weight Bearing Restrictions: No        Mobility Bed Mobility Overal bed mobility: Modified Independent Bed Mobility: Rolling;Supine to Sit;Sit to Supine Rolling: Supervision   Supine to sit: Min assist Sit to supine: Supervision   General bed mobility comments: With use of bedrails, requires MIN A for trunk support following supine>sit    Transfers Overall transfer level: Needs assistance Equipment used: Rolling walker (2 wheels) Transfers: Sit to/from Stand Sit to Stand: Mod assist;From elevated surface                 Balance Overall balance assessment: Needs assistance Sitting-balance support: No upper extremity supported;Feet unsupported Sitting balance-Leahy Scale: Good Sitting balance - Comments: Good sitting balance during seated LB ADLs   Standing balance support: During functional activity;Bilateral upper extremity supported Standing balance-Leahy Scale: Fair Standing balance comment: Requires MIN GUARD for functional mobility of short household distances d/t decreased activity tolerance                           ADL either performed or assessed with clinical judgement   ADL Overall ADL's : Needs assistance/impaired     Grooming: Wash/dry hands;Min guard;Standing               Lower Body Dressing: Min guard;Sitting/lateral leans;Set up;Bed level Lower Body Dressing Details (indicate cue type and reason): MIN GUARD to don/doff socks while seated EOB. SET-UP assist to don/doff underwear at bed-level Toilet Transfer: Moderate assistance;BSC;Rolling walker (2 wheels) Toilet Transfer Details (indicate cue type and reason): Requires MOD A for upward momentum. Toileting- Clothing Manipulation and Hygiene: Min guard;Sitting/lateral lean Toileting - Clothing Manipulation Details (indicate cue type and reason): MIN GUARD for seated frontal  peri-care     Functional mobility during ADLs: Min guard;Rolling walker (2 wheels) (to walk 60 ft)        Cognition Arousal/Alertness:  Awake/alert Behavior During Therapy: WFL for tasks assessed/performed Overall Cognitive Status: Within Functional Limits for tasks assessed                                 General Comments: pt is alert and oriented. She is motivated and pleasant throughout with Chartered loss adjuster                     Pertinent Vitals/ Pain       Pain Assessment: No/denies pain         Frequency  Min 2X/week        Progress Toward Goals  OT Goals(current goals can now be found in the care plan section)  Progress towards OT goals: Progressing toward goals  Acute Rehab OT Goals OT Goal Formulation: With patient Time For Goal Achievement: 09/15/21 Potential to Achieve Goals: Good  Plan Discharge plan remains appropriate;Frequency remains appropriate       AM-PAC OT "6 Clicks" Daily Activity     Outcome Measure   Help from another person eating meals?: None Help from another person taking care of personal grooming?: A Little Help from another person toileting, which includes using toliet, bedpan, or urinal?: A Lot Help from another person bathing (including washing, rinsing, drying)?: A Lot Help from another person to put on and taking off regular upper body clothing?: A Little Help from another person to put on and taking off regular lower body clothing?: A Little 6 Click Score: 17    End of Session Equipment Utilized During Treatment: Gait belt;Rolling walker (2 wheels)  OT Visit Diagnosis: Unsteadiness on feet (R26.81);Muscle weakness (generalized) (M62.81)   Activity Tolerance Patient tolerated treatment well   Patient Left in bed;with call bell/phone within reach;with bed alarm set   Nurse Communication Mobility status        Time: 1353-1420 OT Time Calculation (min): 27 min  Charges: OT General Charges $OT Visit: 1 Visit OT Treatments $Self Care/Home Management : 8-22 mins $Therapeutic Activity: 8-22 mins  Matthew Folks, OTR/L [facility] [phone]

## 2021-09-13 NOTE — Progress Notes (Signed)
Physical Therapy Treatment Patient Details Name: Jamie Wilkins MRN: [id number] DOB: [birth year] Today's Date: 09/13/2021   History of Present Illness 60 y.o. female who presents to ED following a DSS welfare check; pt and her neighbor called for Meals on Wheels and EMS ultimately brought pt to hospital. Medical history significant for paroxysmal atrial fibrillation not on anticoagulation due to history of GI bleed, alcoholic cirrhosis, anemia.    PT Comments    Pt resting in bed upon PT arrival and eager to get up to walk in the hallway.  Min assist for trunk semi-supine to sitting edge of bed; max assist to stand from elevated bed up to walker; and CGA to ambulate 100 feet with RW.  Limited distance ambulating d/t pt reporting pain in B feet (from neuropathy).  Further activity deferred d/t pt wanting to eat lunch before it was cold.  Currently pt requires increased assist with transfers limiting pt's ability to get OOB and mobilize.  Will continue to focus on strengthening and progressive functional mobility per pt tolerance.   Recommendations for follow up therapy are one component of a multi-disciplinary discharge planning process, led by the attending physician.  Recommendations may be updated based on patient status, additional functional criteria and insurance authorization.  Follow Up Recommendations  Skilled nursing-short term rehab (<3 hours/day)     Assistance Recommended at Discharge    Equipment Recommendations  Rolling walker (2 wheels)    Recommendations for Other Services       Precautions / Restrictions Precautions Precautions: Fall Precaution Comments: monitor HR Restrictions Weight Bearing Restrictions: No     Mobility  Bed Mobility Overal bed mobility: Modified Independent Bed Mobility: Supine to Sit;Sit to Supine   Supine to sit: Min assist;HOB elevated (assist for trunk) Sit to supine: Supervision;HOB elevated   General bed mobility comments: vc's  for positioning in bed    Transfers Overall transfer level: Needs assistance Equipment used: Rolling walker (2 wheels) Transfers: Sit to/from Stand Sit to Stand: Max assist;From elevated surface           General transfer comment: assist to initiate and come to full stand; vc's for lining up to bed prior to sitting; vc's for walker use    Ambulation/Gait Ambulation/Gait assistance: Min guard Gait Distance (Feet): 100 Feet Assistive device: Rolling walker (2 wheels)   Gait velocity: decreased   General Gait Details: partial step through gait pattern; steady with RW   Stairs             Wheelchair Mobility    Modified Rankin (Stroke Patients Only)       Balance Overall balance assessment: Needs assistance Sitting-balance support: No upper extremity supported;Feet unsupported Sitting balance-Leahy Scale: Good Sitting balance - Comments: steady sitting reaching within BOS   Standing balance support: Bilateral upper extremity supported;During functional activity Standing balance-Leahy Scale: Good Standing balance comment: steady ambulating with RW                            Cognition Arousal/Alertness: Awake/alert Behavior During Therapy: WFL for tasks assessed/performed Overall Cognitive Status: Within Functional Limits for tasks assessed                                 General Comments: Pt motivated and participatory.        Exercises      General Comments  Pt agreeable to  PT session.      Pertinent Vitals/Pain Pain Assessment: No/denies pain Faces Pain Scale: Hurts little more Pain Location: pain in B feet from neuropathy Pain Descriptors / Indicators: Sore Pain Intervention(s): Limited activity within patient's tolerance;Monitored during session;Repositioned    Home Living                          Prior Function            PT Goals (current goals can now be found in the care plan section) Acute Rehab  PT Goals Patient Stated Goal: improve walking PT Goal Formulation: With patient Progress towards PT goals: Progressing toward goals    Frequency    Min 2X/week      PT Plan Current plan remains appropriate    Co-evaluation              AM-PAC PT "6 Clicks" Mobility   Outcome Measure  Help needed turning from your back to your side while in a flat bed without using bedrails?: A Little Help needed moving from lying on your back to sitting on the side of a flat bed without using bedrails?: A Little Help needed moving to and from a bed to a chair (including a wheelchair)?: A Little Help needed standing up from a chair using your arms (e.g., wheelchair or bedside chair)?: A Lot Help needed to walk in hospital room?: A Little Help needed climbing 3-5 steps with a railing? : A Lot 6 Click Score: 16    End of Session Equipment Utilized During Treatment: Gait belt Activity Tolerance: Patient tolerated treatment well Patient left: in bed;with call bell/phone within reach;with bed alarm set Nurse Communication: Mobility status;Precautions PT Visit Diagnosis: Muscle weakness (generalized) (M62.81);Adult, failure to thrive (R62.7)     Time: 1206-1229 PT Time Calculation (min) (ACUTE ONLY): 23 min  Charges:  $Gait Training: 8-22 mins $Therapeutic Activity: 8-22 mins                    Hendricks Limes, PT 09/13/21, 3:42 PM

## 2021-09-14 MED ORDER — ALPRAZOLAM 0.25 MG PO TABS
0.2500 mg | ORAL_TABLET | Freq: Two times a day (BID) | ORAL | Status: DC | PRN
  Administered 2021-09-14: 0.25 mg via ORAL
  Filled 2021-09-14: qty 1

## 2021-09-14 MED ORDER — NICOTINE POLACRILEX 2 MG MT GUM
2.0000 mg | CHEWING_GUM | OROMUCOSAL | Status: DC | PRN
  Filled 2021-09-14: qty 1

## 2021-09-14 NOTE — Progress Notes (Signed)
PROGRESS NOTE    Jamie Wilkins   WJX:[id number]  DOB: 17-Aug-1961  PCP: [facility]    DOA: 08/30/2021 LOS: 14    Brief Narrative / Hospital Course to Date:   "Jamie Wilkins is a 60 y.o. female with medical history significant for paroxysmal atrial fibrillation not on anticoagulation due to history of GI bleed, alcoholic liver cirrhosis, chronic anemia.  She was brought to the hospital at the recommendation of DSS after she was found sitting at home in her feces.  She complained of generalized weakness and poor oral intake.  She felt so weak that she was unable to get up from her chair.    She was admitted to the hospital for dehydration, hypotension, hypokalemia, hypomagnesemia, hypophosphatemia and hypocalcemia.  She was treated with IV fluids and her electrolytes were repleted.  Despite adequate IV resuscitation, blood pressure remained low.  Patient said that her blood pressure normally runs low.  She was started on midodrine for hypotension.  She also atrial fibrillation with RVR but rate control drugs were avoided because of hypotension.  Fortunately, her heart rate has improved.   She was seen by the psychiatrist for depression and suicidal ideation.  She was started on mirtazapine for depression.  She was initially placed on IVC but this has been revoked.   She was evaluated by PT and OT who recommended further rehabilitation at SNF."  Assessment & Plan   Principal Problem:   Severe major depression, single episode, without psychotic features (HCC) Active Problems:   Hypocalcemia   Hypokalemia   Hypomagnesemia   Alcoholic hepatitis without ascites   AF (paroxysmal atrial fibrillation) (HCC)   Hypotension   Depression due to physical illness   Decubitus ulcer   Acute metabolic encephalopathy - likely hospital delirium -10/22-25: Patient having intermittent spells of confusion and disorientation at times consistent with delirium. VBG without  hypercapnia and ammonia only minimally elevated, not enough to cause confusion. --Patient is afebrile without leukocytosis or any signs symptoms of infection.   --Monitor clinically, further evaluation if we see signs or symptoms of infection or neurologic changes. --Delirium precautions  Acute on chronic hypotension, dehydration: BP remains soft/low but stable. Continue midodrine. Maintain MAP > 65  Bilateral foot pain - Plantar Fascitis (less likely peripheral neuropathy).  Improved. Patient reports now tolerating ambulation well. Tylenol PRN.  Trial of topical lidocaine.   Recommend nighttime splints for this if persistent.   Continue PT.   Paroxysmal atrial fibrillation with RVR: HR now controlled.  Rate control agents limited by hypotension.     Depression with suicidal ideation: stable without SI or HI.   Continue mirtazapine.   IVC has been revoked.  Psychiatrist has signed off.  Alcohol use disorder: no active s/sx's of withdrawal. Counseled regarding cessation of alcohol use and its harmful health effects.  Continue thiamine and multivitamins.   Monitored on CIWA protocol.   Anemia of chronic disease, folic acid deficiency: Hbg stable.   Continue folic acid. Monitor CBC.  Hypokalemia, hypomagnesemia, hypocalcemia and hypophosphatemia: Improved with replacement.  Monitor and replace as needed.  Vitamin D deficiency: Vitamin D level is 20.3.   Continue vitamin D supplement.   Was taking vitamin D2 50,000 units once a week.     Alcoholic liver cirrhosis: Compensated, no acute issues. Monitor.   Right buttock deep tissue injury (present on admission): see below description Continue local wound care. Awaiting placement to SNF. Pressure Injury 08/31/21 Buttocks Right Deep Tissue Pressure Injury -  Purple or maroon localized area of discolored intact skin or blood-filled blister due to damage of underlying soft tissue from pressure and/or shear. (Active)  08/31/21 1800   Location: Buttocks  Location Orientation: Right  Staging: Deep Tissue Pressure Injury - Purple or maroon localized area of discolored intact skin or blood-filled blister due to damage of underlying soft tissue from pressure and/or shear.  Wound Description (Comments):   Present on Admission: Yes     Patient BMI: Body mass index is 22.11 kg/m.   DVT prophylaxis: enoxaparin (LOVENOX) injection 40 mg Start: 08/30/21 2330   Diet:  Diet Orders (From admission, onward)     Start     Ordered   08/30/21 2320  Diet regular Room service appropriate? Yes; Fluid consistency: Thin  Diet effective now       Question Answer Comment  Room service appropriate? Yes   Fluid consistency: Thin      08/30/21 2320              Code Status: Full Code   Subjective 09/14/21    Patient says she cannot stay here any longer, going to lose her mind.  States she just needs to go outside for 2 puffs on the cigarette.  States that her neighbors can come to her house anytime she needs them to help her get standing up and then she is able to walk on her own.  Discussed with case management and therapy.  Neighbors were not able to adequately help her inpatient still maximum assist to get to a standing position.  Not safe to go home.  Patient does not seem to understand this.   Disposition Plan & Communication   Status is: Inpatient  Remains inpatient appropriate because: Requires SNF placement, pending   Consults, Procedures, Significant Events   Consultants:  Psychiatry  Procedures:  None  Antimicrobials:  Anti-infectives (From admission, onward)    None         Micro    Objective   Vitals:   09/13/21 1111 09/13/21 2035 09/14/21 0435 09/14/21 1147  BP: 104/76 107/79 114/89 106/69  Pulse: 81 86 100 82  Resp: 16 20 16 16   Temp: 98.4 F (36.9 C) 98.5 F (36.9 C) 97.9 F (36.6 C) 98.5 F (36.9 C)  TempSrc: Oral Oral Oral   SpO2: 94% 100% 97% 97%  Weight:      Height:         Intake/Output Summary (Last 24 hours) at 09/14/2021 1402 Last data filed at 09/14/2021 1018 Gross per 24 hour  Intake 240 ml  Output 900 ml  Net -660 ml   Filed Weights   08/30/21 1936  Weight: 62.1 kg    Physical Exam:  General exam: sitting up in bed, awake, alert, no acute distress Respiratory system: normal respiratory effort, on room air Cardiovascular system: Regular rate and rhythm, no lower extremity edema.   Central nervous system: A&O x3. no gross focal neurologic deficits, normal speech Psychiatry: irritable mood, congruent affect, abnormal judgement and insight  Labs   Data Reviewed: I have personally reviewed following labs and imaging studies  CBC: Recent Labs  Lab 09/13/21 0436  WBC 3.8*  HGB 7.8*  HCT 24.6*  MCV 100.8*  PLT 235   Basic Metabolic Panel: Recent Labs  Lab 09/11/21 0404  NA 138  K 4.4  CL 106  CO2 27  GLUCOSE 77  BUN 9  CREATININE 1.07*  CALCIUM 7.9*   GFR: Estimated Creatinine Clearance: 51.7 mL/min (  A) (by C-G formula based on SCr of 1.07 mg/dL (H)). Liver Function Tests: No results for input(s): AST, ALT, ALKPHOS, BILITOT, PROT, ALBUMIN in the last 168 hours. No results for input(s): LIPASE, AMYLASE in the last 168 hours. Recent Labs  Lab 09/12/21 0802  AMMONIA 38*   Coagulation Profile: No results for input(s): INR, PROTIME in the last 168 hours. Cardiac Enzymes: No results for input(s): CKTOTAL, CKMB, CKMBINDEX, TROPONINI in the last 168 hours. BNP (last 3 results) No results for input(s): PROBNP in the last 8760 hours. HbA1C: No results for input(s): HGBA1C in the last 72 hours. CBG: No results for input(s): GLUCAP in the last 168 hours.  Lipid Profile: No results for input(s): CHOL, HDL, LDLCALC, TRIG, CHOLHDL, LDLDIRECT in the last 72 hours. Thyroid Function Tests: No results for input(s): TSH, T4TOTAL, FREET4, T3FREE, THYROIDAB in the last 72 hours. Anemia Panel: No results for input(s): VITAMINB12,  FOLATE, FERRITIN, TIBC, IRON, RETICCTPCT in the last 72 hours. Sepsis Labs: No results for input(s): PROCALCITON, LATICACIDVEN in the last 168 hours.  No results found for this or any previous visit (from the past 240 hour(s)).     Imaging Studies   No results found.   Medications   Scheduled Meds:  cholecalciferol  1,000 Units Oral Daily   enoxaparin (LOVENOX) injection  40 mg Subcutaneous Q24H   folic acid  1 mg Oral Daily   midodrine  5 mg Oral TID WC   mirtazapine  30 mg Oral QHS   multivitamin with minerals  1 tablet Oral Daily   nicotine  14 mg Transdermal Daily   polyethylene glycol  17 g Oral Daily   thiamine  100 mg Oral Daily   Or   thiamine  100 mg Intravenous Daily   Vitamin D (Ergocalciferol)  50,000 Units Oral Q7 days   Continuous Infusions:     LOS: 14 days    Time spent: 20 minutes    Pennie Banter, DO [facility]  09/14/2021, 2:02 PM      If 7PM-7AM, please contact night-coverage. How to contact the [facility] Attending or Consulting provider 7A - 7P or covering provider during after hours 7P -7A, for this patient?    Check the care team in [facility] and look for a) attending/consulting [facility] provider listed and b) the [facility] team listed Log into [email or web address] and use [place]'s universal password to access. If you do not have the password, please contact the hospital operator. Locate the [facility] provider you are looking for under [facility] and page to a number that you can be directly reached. If you still have difficulty reaching the provider, please page the [facility] (Director on Call) for the Hospitalists listed on amion for assistance.

## 2021-09-15 DIAGNOSIS — R627 Adult failure to thrive: Secondary | ICD-10-CM

## 2021-09-15 DIAGNOSIS — F22 Delusional disorders: Secondary | ICD-10-CM

## 2021-09-15 LAB — CBC WITH DIFFERENTIAL/PLATELET
Abs Immature Granulocytes: 0.01 10*3/uL (ref 0.00–0.07)
Basophils Absolute: 0 10*3/uL (ref 0.0–0.1)
Basophils Relative: 1 %
Eosinophils Absolute: 0.1 10*3/uL (ref 0.0–0.5)
Eosinophils Relative: 2 %
HCT: 23.6 % — ABNORMAL LOW (ref 36.0–46.0)
Hemoglobin: 7.6 g/dL — ABNORMAL LOW (ref 12.0–15.0)
Immature Granulocytes: 0 %
Lymphocytes Relative: 40 %
Lymphs Abs: 1.7 10*3/uL (ref 0.7–4.0)
MCH: 32.9 pg (ref 26.0–34.0)
MCHC: 32.2 g/dL (ref 30.0–36.0)
MCV: 102.2 fL — ABNORMAL HIGH (ref 80.0–100.0)
Monocytes Absolute: 0.5 10*3/uL (ref 0.1–1.0)
Monocytes Relative: 11 %
Neutro Abs: 1.9 10*3/uL (ref 1.7–7.7)
Neutrophils Relative %: 46 %
Platelets: 238 10*3/uL (ref 150–400)
RBC: 2.31 MIL/uL — ABNORMAL LOW (ref 3.87–5.11)
RDW: 17.7 % — ABNORMAL HIGH (ref 11.5–15.5)
WBC: 4.2 10*3/uL (ref 4.0–10.5)
nRBC: 0 % (ref 0.0–0.2)

## 2021-09-15 LAB — COMPREHENSIVE METABOLIC PANEL
ALT: 21 U/L (ref 0–44)
AST: 46 U/L — ABNORMAL HIGH (ref 15–41)
Albumin: 1.5 g/dL — ABNORMAL LOW (ref 3.5–5.0)
Alkaline Phosphatase: 137 U/L — ABNORMAL HIGH (ref 38–126)
Anion gap: 5 (ref 5–15)
BUN: 8 mg/dL (ref 8–23)
CO2: 27 mmol/L (ref 22–32)
Calcium: 8 mg/dL — ABNORMAL LOW (ref 8.9–10.3)
Chloride: 107 mmol/L (ref 98–111)
Creatinine, Ser: 0.93 mg/dL (ref 0.44–1.00)
GFR, Estimated: >60 mL/min (ref >60)
Glucose, Bld: 78 mg/dL (ref 70–99)
Potassium: 4.7 mmol/L (ref 3.5–5.1)
Sodium: 139 mmol/L (ref 135–145)
Total Bilirubin: 0.6 mg/dL (ref 0.3–1.2)
Total Protein: 5 g/dL — ABNORMAL LOW (ref 6.5–8.1)

## 2021-09-15 LAB — MAGNESIUM: Magnesium: 2 mg/dL (ref 1.7–2.4)

## 2021-09-15 LAB — PHOSPHORUS: Phosphorus: 5.8 mg/dL — ABNORMAL HIGH (ref 2.5–4.6)

## 2021-09-15 NOTE — Evaluation (Signed)
Occupational Therapy Re-evaluation Patient Details Name: Jamie Wilkins MRN: [id number] DOB: 12/15/1960 Today's Date: 09/15/2021   History of Present Illness 60 y.o. female who presents to ED following a DSS welfare check; pt and her neighbor called for Meals on Wheels and EMS ultimately brought pt to hospital. Medical history significant for paroxysmal atrial fibrillation not on anticoagulation due to history of GI bleed, alcoholic cirrhosis, anemia.   Clinical Impression   Pt seen for OT re-evaluation on this date. Upon arrival to room, pt awake and seated upright in bed. Pt highly motivated to participate in OT tx. This date, pt attempted x5 sit<>stand transfers from bed and BSC. Pt continues to require MOD-MAX A for transfers from standard chair height and MIN A from elevated heights. Pt is making good progress toward goals though, and 4/4 goals have been met and updated. Pt continues to present with decreased strength and activity tolerance, and discharge recommendation remains appropriate. Pt would benefit from continued skilled OT services to maximize return to PLOF and minimize risk of future falls, injury, caregiver burden, and readmission. At end of session, pt verbalized motivation to go to STR. Will continue to follow POC. Discharge recommendation remains appropriate.       Recommendations for follow up therapy are one component of a multi-disciplinary discharge planning process, led by the attending physician.  Recommendations may be updated based on patient status, additional functional criteria and insurance authorization.   Follow Up Recommendations  Skilled nursing-short term rehab (<3 hours/day)    Assistance Recommended at Discharge Frequent or constant Supervision/Assistance  Functional Status Assessment     Equipment Recommendations  Other (comment) (defer to next venue of care)       Precautions / Restrictions Precautions Precautions: Fall Precaution Comments:  monitor HR Restrictions Weight Bearing Restrictions: No      Mobility Bed Mobility Overal bed mobility: Needs Assistance Bed Mobility: Supine to Sit;Sit to Supine     Supine to sit: HOB elevated;Supervision Sit to supine: Supervision   General bed mobility comments: modA for cranial scoot    Transfers Overall transfer level: Needs assistance Equipment used: Rolling walker (2 wheels) Transfers: Sit to/from Stand Sit to Stand: Max assist Stand pivot transfers: Mod assist;Min assist         General transfer comment: x5 attempts from bed/BSC. Pt requires MOD-MAX A from lowest bed height. MIN A from bed height at 79f. Requires verbal cues for body positioning      Balance Overall balance assessment: Needs assistance Sitting-balance support: No upper extremity supported;Feet unsupported Sitting balance-Leahy Scale: Good Sitting balance - Comments: steady sitting reaching within BOS   Standing balance support: Bilateral upper extremity supported;During functional activity Standing balance-Leahy Scale: Good Standing balance comment: SUPERVISION walking short household distances with RW                           ADL either performed or assessed with clinical judgement   ADL Overall ADL's : Needs assistance/impaired     Grooming: Wash/dry hands;Min guard;Standing Grooming Details (indicate cue type and reason): sink-side, standing with RW, to complete 1 g/h task.             Lower Body Dressing: Min guard;Sitting/lateral leans;Set up;Bed level Lower Body Dressing Details (indicate cue type and reason): MIN GUARD to don/doff socks while seated EOB. Toilet Transfer: Minimal assistance;BSC;Rolling walker (2 wheels) Toilet Transfer Details (indicate cue type and reason): x2 attempts. Unable to clear hips during  second attempt, however following verbal cues for body positioning required only MIN A with BSC inheighest height setting Toileting- Clothing  Manipulation and Hygiene: Min guard;Sitting/lateral lean Toileting - Clothing Manipulation Details (indicate cue type and reason): MIN GUARD for seated frontal peri-care     Functional mobility during ADLs: Min guard;Rolling walker (2 wheels)        Pertinent Vitals/Pain Pain Assessment: Faces Faces Pain Scale: Hurts even more Pain Location: pain in B feet from neuropathy Pain Descriptors / Indicators: Sore Pain Intervention(s): Limited activity within patient's tolerance;Monitored during session     Extremity/Trunk Assessment Upper Extremity Assessment Upper Extremity Assessment: Generalized weakness   Lower Extremity Assessment Lower Extremity Assessment: Generalized weakness       Communication     Cognition Arousal/Alertness: Awake/alert Behavior During Therapy: WFL for tasks assessed/performed Overall Cognitive Status: Within Functional Limits for tasks assessed                                 General Comments: Pt verbalized frustration with current level of assist required for functional mobility, however was motivated throughout             OT Problem List: Decreased strength;Decreased activity tolerance;Impaired balance (sitting and/or standing);Decreased safety awareness;Decreased knowledge of use of DME or AE;Pain      OT Treatment/Interventions: Self-care/ADL training;Therapeutic exercise;Therapeutic activities;Neuromuscular education;Energy conservation;DME and/or AE instruction;Patient/family education;Balance training;Manual therapy    OT Goals(Current goals can be found in the care plan section) Acute Rehab OT Goals Patient Stated Goal: to be able to walk at son's wedding OT Goal Formulation: With patient Time For Goal Achievement: 09/29/21 Potential to Achieve Goals: Fair ADL Goals Pt Will Perform Grooming: with modified independence;standing Pt Will Transfer to Toilet: with modified independence;ambulating;bedside commode Pt Will  Perform Toileting - Clothing Manipulation and hygiene: with modified independence;sitting/lateral leans  OT Frequency: Min 2X/week   Barriers to D/C: Decreased caregiver support      AM-PAC OT "6 Clicks" Daily Activity     Outcome Measure Help from another person eating meals?: None Help from another person taking care of personal grooming?: A Little Help from another person toileting, which includes using toliet, bedpan, or urinal?: A Lot Help from another person bathing (including washing, rinsing, drying)?: A Lot Help from another person to put on and taking off regular upper body clothing?: A Little Help from another person to put on and taking off regular lower body clothing?: A Little 6 Click Score: 17   End of Session Equipment Utilized During Treatment: Gait belt;Rolling walker (2 wheels) Nurse Communication: Mobility status  Activity Tolerance: Patient tolerated treatment well Patient left: in bed;with call bell/phone within reach;with bed alarm set  OT Visit Diagnosis: Unsteadiness on feet (R26.81);Muscle weakness (generalized) (M62.81)                Time: 8546-2703 OT Time Calculation (min): 30 min Charges:  OT General Charges $OT Visit: 1 Visit OT Treatments $Self Care/Home Management : 23-37 mins  Fredirick Maudlin, OTR/L [place]

## 2021-09-15 NOTE — Progress Notes (Signed)
Patient refusing to allow nursing staff to change bed pad and linens after having incont episode. Patient states, "It can wait until in the morning." Patient also refusing midnight vital checks.

## 2021-09-15 NOTE — Progress Notes (Signed)
Refused midnight vitals and lovenox shot

## 2021-09-15 NOTE — Progress Notes (Signed)
PROGRESS NOTE    Jamie Wilkins  UDJ:[id number] DOB: 1961-04-28 DOA: 08/30/2021 PCP: [facility]   Brief Narrative:  60 y.o. BF PMHx paroxysmal atrial fibrillation not on anticoagulation due to history of GI bleed, alcoholic liver cirrhosis, chronic anemia.    Brought to the hospital at the recommendation of DSS after she was found sitting at home in her feces.  She complained of generalized weakness and poor oral intake.  She felt so weak that she was unable to get up from her chair.    She was admitted to the hospital for dehydration, hypotension, hypokalemia, hypomagnesemia, hypophosphatemia and hypocalcemia.  She was treated with IV fluids and her electrolytes were repleted.  Despite adequate IV resuscitation, blood pressure remained low.  Patient said that her blood pressure normally runs low.  She was started on midodrine for hypotension.  She also atrial fibrillation with RVR but rate control drugs were avoided because of hypotension.  Fortunately, her heart rate has improved.   She was seen by the psychiatrist for depression and suicidal ideation.  She was started on mirtazapine for depression.  She was initially placed on IVC but this has been revoked.   She was evaluated by PT and OT who recommended further rehabilitation at SNF."   Subjective: A/O x4 however patient delusional (states that PT and OT told her she could be discharged when their notes clearly state need to be discharged to SNF, states she was not found down in her feces and urine when admission note states that DSS was involved with her admission secondary to this issue).  In addition unable to get to her feet without moderate assistance from nursing staff but states that able to rise to her feet with little to no assistance.   Assessment & Plan:  Covid vaccination;  Principal Problem:   Severe major depression, single episode, without psychotic features (HCC) Active Problems:    Hypocalcemia   Hypokalemia   Hypomagnesemia   Alcoholic hepatitis without ascites   AF (paroxysmal atrial fibrillation) (HCC)   Hypotension   Depression due to physical illness   Decubitus ulcer   Delusions (HCC)   Acute metabolic encephalopathy /Hospital Delirium  -10/22-25: Patient having intermittent spells of confusion and disorientation at times consistent with delirium. VBG without hypercapnia and ammonia only minimally elevated, not enough to cause confusion. --Patient is afebrile without leukocytosis or any signs symptoms of infection.   --Monitor clinically, further evaluation if we see signs or symptoms of infection or neurologic changes. --Delirium precautions  Delusions - 10/26 patient delusional.  Therefore LACKS CAPACITY to make medical decisions.  NO HOME support.  Lives alone.  Depression with suicidal ideation:  stable without SI or HI.   Continue mirtazapine.   IVC has been revoked.  Psychiatrist has signed off.   Acute on chronic hypotension, dehydration:  -BP remains soft/low but stable. -Continue midodrine. -Maintain MAP > 65   Bilateral foot pain - Plantar Fascitis (less likely peripheral neuropathy).   Improved. Patient reports now tolerating ambulation well. Tylenol PRN.  Trial of topical lidocaine.   Recommend nighttime splints for this if persistent.   Continue PT.   Paroxysmal atrial fibrillation with RVR:  HR now controlled.  Rate control agents limited by hypotension.     EtOH -No active s/sx's of withdrawal. Counseled regarding cessation of alcohol use and its harmful health effects.  Continue thiamine and multivitamins.   Monitored on CIWA protocol.  ETOH liver cirrhosis:  -Compensated, no acute issues.  Monitor.   Anemia of chronic disease, folic acid deficiency:  Hbg stable.   Continue folic acid. Monitor CBC.  Hypokalemia, hypomagnesemia, hypocalcemia and hypophosphatemia:  Improved with replacement.  Monitor and replace as  needed.  Vitamin D deficiency:  Vitamin D level is 20.3.   Continue vitamin D supplement.   Was taking vitamin D2 50,000 units once a week.        Right buttock deep tissue injury  Pressure Injury 08/31/21 Buttocks Right Deep Tissue Pressure Injury - Purple or maroon localized area of discolored intact skin or blood-filled blister due to damage of underlying soft tissue from pressure and/or shear. (Active)  08/31/21 1800  Location: Buttocks  Location Orientation: Right  Staging: Deep Tissue Pressure Injury - Purple or maroon localized area of discolored intact skin or blood-filled blister due to damage of underlying soft tissue from pressure and/or shear.  Wound Description (Comments):   Present on Admission: Yes  -Continue local wound care.    DVT prophylaxis: Lovenox  code Status: Full Family Communication:  Status is: Inpatient    Dispo: The patient is from: Home              Anticipated d/c is to: SNF              Anticipated d/c date is: > 3 days              Patient currently is not medically stable to d/c.      Consultants:  Psychiatry  Procedures/Significant Events:     I have personally reviewed and interpreted all radiology studies and my findings are as above.  VENTILATOR SETTINGS:    Cultures   Antimicrobials:    Devices    LINES / TUBES:      Continuous Infusions:   Objective: Vitals:   09/15/21 1559 09/15/21 2026 09/16/21 0356 09/16/21 0842  BP: 125/84 116/78 (!) 131/95 100/72  Pulse: 78 82 89 94  Resp: 18 18 16 16   Temp: 99.1 F (37.3 C) 99 F (37.2 C) 98.6 F (37 C) 98.2 F (36.8 C)  TempSrc: Oral Oral Oral Oral  SpO2: 100% 96% 93% 95%  Weight:      Height:        Intake/Output Summary (Last 24 hours) at 09/16/2021 09/18/2021 Last data filed at 09/15/2021 1413 Gross per 24 hour  Intake 600 ml  Output --  Net 600 ml   Filed Weights   08/30/21 1936  Weight: 62.1 kg    Examination:  General: A/O x4, No acute  respiratory distress Eyes: negative scleral hemorrhage, negative anisocoria, negative icterus ENT: Negative Runny nose, negative gingival bleeding, Neck:  Negative scars, masses, torticollis, lymphadenopathy, JVD Lungs: Clear to auscultation bilaterally without wheezes or crackles Cardiovascular: Regular rate and rhythm without murmur gallop or rub normal S1 and S2 Abdomen: negative abdominal pain, nondistended, positive soft, bowel sounds, no rebound, no ascites, no appreciable mass Extremities: No significant cyanosis, clubbing, or edema bilateral lower extremities Skin: Negative rashes, lesions, ulcers Psychiatric:  Negative depression, negative anxiety, negative fatigue, negative mania, POSITIVE delusions, POSITIVE extremely poor insight Central nervous system:  Cranial nerves II through XII intact, tongue/uvula midline, all extremities muscle strength 5/5 (when sitting).  Patient unable to rise from seated position without moderate assist., sensation intact throughout, negative dysarthria, negative expressive aphasia, negative receptive aphasia.  .     Data Reviewed: Care during the described time interval was provided by me .  I have reviewed this patient's available data,  including medical history, events of note, physical examination, and all test results as part of my evaluation.   CBC: Recent Labs  Lab 09/13/21 0436 09/15/21 0756 09/16/21 0611  WBC 3.8* 4.2 4.1  NEUTROABS  --  1.9 2.0  HGB 7.8* 7.6* 7.9*  HCT 24.6* 23.6* 24.2*  MCV 100.8* 102.2* 100.0  PLT 235 238 261   Basic Metabolic Panel: Recent Labs  Lab 09/11/21 0404 09/15/21 0756 09/16/21 0611  NA 138 139 141  K 4.4 4.7 4.7  CL 106 107 107  CO2 27 27 27   GLUCOSE 77 78 71  BUN 9 8 9   CREATININE 1.07* 0.93 1.09*  CALCIUM 7.9* 8.0* 8.1*  MG  --  2.0 2.0  PHOS  --  5.8* 5.8*   GFR: Estimated Creatinine Clearance: 50.7 mL/min (A) (by C-G formula based on SCr of 1.09 mg/dL (H)). Liver Function  Tests: Recent Labs  Lab 09/15/21 0756 09/16/21 0611  AST 46* 42*  ALT 21 19  ALKPHOS 137* 130*  BILITOT 0.6 0.6  PROT 5.0* 5.2*  ALBUMIN 1.5* 1.6*   No results for input(s): LIPASE, AMYLASE in the last 168 hours. Recent Labs  Lab 09/12/21 0802  AMMONIA 38*   Coagulation Profile: No results for input(s): INR, PROTIME in the last 168 hours. Cardiac Enzymes: No results for input(s): CKTOTAL, CKMB, CKMBINDEX, TROPONINI in the last 168 hours. BNP (last 3 results) No results for input(s): PROBNP in the last 8760 hours. HbA1C: No results for input(s): HGBA1C in the last 72 hours. CBG: No results for input(s): GLUCAP in the last 168 hours. Lipid Profile: No results for input(s): CHOL, HDL, LDLCALC, TRIG, CHOLHDL, LDLDIRECT in the last 72 hours. Thyroid Function Tests: No results for input(s): TSH, T4TOTAL, FREET4, T3FREE, THYROIDAB in the last 72 hours. Anemia Panel: Recent Labs    09/16/21 0611  FOLATE 17.0  FERRITIN 1,307*  TIBC 129*  IRON 37  RETICCTPCT 3.6*   Urine analysis:    Component Value Date/Time   COLORURINE YELLOW (A) 09/04/2021 0507   APPEARANCEUR HAZY (A) 09/04/2021 0507   LABSPEC 1.004 (L) 09/04/2021 0507   PHURINE 6.0 09/04/2021 0507   GLUCOSEU NEGATIVE 09/04/2021 0507   HGBUR SMALL (A) 09/04/2021 0507   BILIRUBINUR NEGATIVE 09/04/2021 0507   KETONESUR NEGATIVE 09/04/2021 0507   PROTEINUR NEGATIVE 09/04/2021 0507   NITRITE NEGATIVE 09/04/2021 0507   LEUKOCYTESUR LARGE (A) 09/04/2021 0507   Sepsis Labs: @LABRCNTIP (procalcitonin:4,lacticidven:4)  )No results found for this or any previous visit (from the past 240 hour(s)).       Radiology Studies: No results found.      Scheduled Meds:  cholecalciferol  1,000 Units Oral Daily   enoxaparin (LOVENOX) injection  40 mg Subcutaneous Q24H   folic acid  1 mg Oral Daily   midodrine  5 mg Oral TID WC   mirtazapine  30 mg Oral QHS   multivitamin with minerals  1 tablet Oral Daily   nicotine   14 mg Transdermal Daily   polyethylene glycol  17 g Oral Daily   thiamine  100 mg Oral Daily   Or   thiamine  100 mg Intravenous Daily   Vitamin D (Ergocalciferol)  50,000 Units Oral Q7 days   Continuous Infusions:   LOS: 16 days   The patient is critically ill with multiple organ systems failure and requires high complexity decision making for assessment and support, frequent evaluation and titration of therapies, application of advanced monitoring technologies and extensive interpretation of multiple databases.  Critical Care Time devoted to patient care services described in this note  Time spent: 40 minutes     Taniqua Issa, Roselind Messier, MD [facility]   If 7PM-7AM, please contact night-coverage 09/16/2021, 8:53 AM

## 2021-09-15 NOTE — Progress Notes (Signed)
Physical Therapy Treatment Patient Details Name: Jamie Wilkins MRN: [id number] DOB: Mar 29, 1961 Today's Date: 09/15/2021   History of Present Illness 60 y.o. female who presents to ED following a DSS welfare check; pt and her neighbor called for Meals on Wheels and EMS ultimately brought pt to hospital. Medical history significant for paroxysmal atrial fibrillation not on anticoagulation due to history of GI bleed, alcoholic cirrhosis, anemia.    PT Comments    Pt in be dupon arrival, agreeable to session, highly motivated to advance her independence and tolerance of basic mobility for ADL performance. No assist needed for supine to EOB, but unable to scoot in bed without modA. Pt able to rise to standing with RW from 27" high surface which is not realistically applicable to most surfaces in a typical household, however she does utilize a lift chair at home. It is not clear that she could get off a commode without physical assistance from another person, appears the [facility] in room maxes out at 24-25 inches in height. In standing, balance appears safe with RW. Pt able to AMB about 79ft with RW, but foot pain precludes longer distances. Author is able to fashion some modifications to footwear to allow off loading of sensitive foot structures in weightbearing, pt able to advance distance to 17ft, but needs assist for safety for last 25ft due to rapid progression of fatigue and weakness. Pt voices frustrations with quality of care, distress from being estranged from her home for so long, voices desire to leave "today" at end of session. She is clearly still veyr limited, weak, with safety concerns. Pt does not have good insight regarding her limitations and how they would impact her independence and safety at DC. Pt is unable to find her room both times we AMB out into hallway, raises of any other occult deficits in cognition at present.      Recommendations for follow up therapy are one component of a  multi-disciplinary discharge planning process, led by the attending physician.  Recommendations may be updated based on patient status, additional functional criteria and insurance authorization.  Follow Up Recommendations  Skilled nursing-short term rehab (<3 hours/day)     Assistance Recommended at Discharge Intermittent Supervision/Assistance  Equipment Recommendations  Rolling walker (2 wheels)    Recommendations for Other Services OT consult     Precautions / Restrictions Precautions Precautions: Fall Precaution Comments: monitor HR Restrictions Weight Bearing Restrictions: No     Mobility  Bed Mobility Overal bed mobility: Modified Independent Bed Mobility: Supine to Sit;Sit to Supine     Supine to sit: HOB elevated;Supervision Sit to supine: Mod assist;Supervision   General bed mobility comments: modA for cranial scoot    Transfers Overall transfer level: Needs assistance Equipment used: Rolling walker (2 wheels) Transfers: Sit to/from Stand Sit to Stand: Max assist           General transfer comment: able to perform at supervision level from 27" high surface as requests, moderate to heavy effort (from recliner height needs assistance)    Ambulation/Gait Ambulation/Gait assistance: Min guard Gait Distance (Feet): 70 Feet Assistive device: Rolling walker (2 wheels) Gait Pattern/deviations: WFL(Within Functional Limits) Gait velocity: 0.22m/s   General Gait Details: has to turn back and cease AMB due to severe aggravation of neurpathic foot pain   Stairs             Wheelchair Mobility    Modified Rankin (Stroke Patients Only)       Balance  Cognition Arousal/Alertness: Awake/alert Behavior During Therapy: WFL for tasks assessed/performed Overall Cognitive Status: Within Functional Limits for tasks assessed                                           Exercises Other Exercises Other Exercises: STS from 27" high EOB 3x with RW, supervision level, takes recoveyr breaks between Other Exercises: Chartered loss adjuster fashions some "slippers" out of tape, wash clothes, and hospital socks: foot pain improved, AMB increased to 138ft, pt very weak and dizzy toward end of AMB    General Comments        Pertinent Vitals/Pain Faces Pain Scale: Hurts even more Pain Location: pain in B feet from neuropathy Pain Intervention(s): Limited activity within patient's tolerance;Monitored during session    Home Living                          Prior Function            PT Goals (current goals can now be found in the care plan section) Acute Rehab PT Goals Patient Stated Goal: improve walking PT Goal Formulation: With patient Progress towards PT goals: Progressing toward goals    Frequency    Min 2X/week      PT Plan Current plan remains appropriate    Co-evaluation              AM-PAC PT "6 Clicks" Mobility   Outcome Measure  Help needed turning from your back to your side while in a flat bed without using bedrails?: A Little Help needed moving from lying on your back to sitting on the side of a flat bed without using bedrails?: A Little Help needed moving to and from a bed to a chair (including a wheelchair)?: A Little Help needed standing up from a chair using your arms (e.g., wheelchair or bedside chair)?: A Lot Help needed to walk in hospital room?: A Little Help needed climbing 3-5 steps with a railing? : A Lot 6 Click Score: 16    End of Session Equipment Utilized During Treatment: Gait belt (homemade slippers) Activity Tolerance: Patient tolerated treatment well;Patient limited by pain;Patient limited by lethargy Patient left: in bed;with call bell/phone within reach;with bed alarm set   PT Visit Diagnosis: Muscle weakness (generalized) (M62.81);Adult, failure to thrive (R62.7)     Time: 1740-8144 PT Time Calculation  (min) (ACUTE ONLY): 30 min  Charges:  $Therapeutic Exercise: 23-37 mins                    12:40 PM, 09/15/21 Rosamaria Lints, PT, DPT Physical Therapist - [facility]  [phone] (ASCOM)     Nicollette Wilhelmi C 09/15/2021, 12:33 PM

## 2021-09-15 NOTE — TOC Progression Note (Signed)
Transition of Care [facility]) - Progression Note    Patient Details  Name: Jamie Wilkins MRN: [id number] Date of Birth: [birth year]  Transition of Care [facility]) CM/SW Contact  Allayne Butcher, RN Phone Number: 09/15/2021, 11:07 AM  Clinical Narrative:     RNCM heard from DSS worker Joy Sumpter [phone] yesterday, she was just following up on plan of care for patient.  At this time no bed offers.  Patient was able to walk with therapy the other day but was still not able to stand on her own.  If patient can get to the point where she can stand and walk and home health will be appropriate patient can be discharged home with charity [facility].  TOC will follow closely.     Expected Discharge Plan: Home w Home Health Services Barriers to Discharge: Continued Medical Work up  Expected Discharge Plan and Services Expected Discharge Plan: Home w Home Health Services       Living arrangements for the past 2 months: Single Family Home                                       Social Determinants of Health (SDOH) Interventions    Readmission Risk Interventions No flowsheet data found.

## 2021-09-16 DIAGNOSIS — F22 Delusional disorders: Secondary | ICD-10-CM | POA: Diagnosis present

## 2021-09-16 LAB — CBC WITH DIFFERENTIAL/PLATELET
Abs Immature Granulocytes: 0.01 10*3/uL (ref 0.00–0.07)
Basophils Absolute: 0 10*3/uL (ref 0.0–0.1)
Basophils Relative: 1 %
Eosinophils Absolute: 0.1 10*3/uL (ref 0.0–0.5)
Eosinophils Relative: 2 %
HCT: 24.2 % — ABNORMAL LOW (ref 36.0–46.0)
Hemoglobin: 7.9 g/dL — ABNORMAL LOW (ref 12.0–15.0)
Immature Granulocytes: 0 %
Lymphocytes Relative: 39 %
Lymphs Abs: 1.6 10*3/uL (ref 0.7–4.0)
MCH: 32.6 pg (ref 26.0–34.0)
MCHC: 32.6 g/dL (ref 30.0–36.0)
MCV: 100 fL (ref 80.0–100.0)
Monocytes Absolute: 0.5 10*3/uL (ref 0.1–1.0)
Monocytes Relative: 11 %
Neutro Abs: 2 10*3/uL (ref 1.7–7.7)
Neutrophils Relative %: 47 %
Platelets: 261 10*3/uL (ref 150–400)
RBC: 2.42 MIL/uL — ABNORMAL LOW (ref 3.87–5.11)
RDW: 17.2 % — ABNORMAL HIGH (ref 11.5–15.5)
WBC: 4.1 10*3/uL (ref 4.0–10.5)
nRBC: 0 % (ref 0.0–0.2)

## 2021-09-16 LAB — COMPREHENSIVE METABOLIC PANEL
ALT: 19 U/L (ref 0–44)
AST: 42 U/L — ABNORMAL HIGH (ref 15–41)
Albumin: 1.6 g/dL — ABNORMAL LOW (ref 3.5–5.0)
Alkaline Phosphatase: 130 U/L — ABNORMAL HIGH (ref 38–126)
Anion gap: 7 (ref 5–15)
BUN: 9 mg/dL (ref 8–23)
CO2: 27 mmol/L (ref 22–32)
Calcium: 8.1 mg/dL — ABNORMAL LOW (ref 8.9–10.3)
Chloride: 107 mmol/L (ref 98–111)
Creatinine, Ser: 1.09 mg/dL — ABNORMAL HIGH (ref 0.44–1.00)
GFR, Estimated: 58 mL/min — ABNORMAL LOW (ref >60)
Glucose, Bld: 71 mg/dL (ref 70–99)
Potassium: 4.7 mmol/L (ref 3.5–5.1)
Sodium: 141 mmol/L (ref 135–145)
Total Bilirubin: 0.6 mg/dL (ref 0.3–1.2)
Total Protein: 5.2 g/dL — ABNORMAL LOW (ref 6.5–8.1)

## 2021-09-16 LAB — FOLATE: Folate: 17 ng/mL

## 2021-09-16 LAB — FERRITIN: Ferritin: 1307 ng/mL — ABNORMAL HIGH (ref 11–307)

## 2021-09-16 LAB — RETICULOCYTES
Immature Retic Fract: 18.1 % — ABNORMAL HIGH (ref 2.3–15.9)
RBC.: 2.51 MIL/uL — ABNORMAL LOW (ref 3.87–5.11)
Retic Count, Absolute: 89.9 K/uL (ref 19.0–186.0)
Retic Ct Pct: 3.6 % — ABNORMAL HIGH (ref 0.4–3.1)

## 2021-09-16 LAB — IRON AND TIBC
Iron: 37 ug/dL (ref 28–170)
Saturation Ratios: 29 % (ref 10.4–31.8)
TIBC: 129 ug/dL — ABNORMAL LOW (ref 250–450)
UIBC: 92 ug/dL

## 2021-09-16 LAB — LACTATE DEHYDROGENASE: LDH: 178 U/L (ref 98–192)

## 2021-09-16 LAB — VITAMIN B12: Vitamin B-12: 515 pg/mL (ref 180–914)

## 2021-09-16 LAB — MAGNESIUM: Magnesium: 2 mg/dL (ref 1.7–2.4)

## 2021-09-16 LAB — PHOSPHORUS: Phosphorus: 5.8 mg/dL — ABNORMAL HIGH (ref 2.5–4.6)

## 2021-09-16 MED ORDER — ENSURE ENLIVE PO LIQD
237.0000 mL | Freq: Three times a day (TID) | ORAL | Status: DC
  Administered 2021-09-16 – 2021-09-20 (×13): 237 mL via ORAL

## 2021-09-16 NOTE — Evaluation (Signed)
Physical Therapy Re-Evaluation Patient Details Name: Jamie Wilkins MRN: [id number] DOB: 09-Nov-1961 Today's Date: 09/16/2021  History of Present Illness  60 y.o. female who presents to ED following a DSS welfare check; pt and her neighbor called for [employer] Whe[employer] and EMS ultimately brought pt to hospital. Medical history significant for paroxysmal atrial fibrillation not on anticoagulation due to history of GI bleed, alcoholic cirrhosis, anemia.  Clinical Impression  PT re-evaluation performed d/t pt's extended hospitalization.  Pt reporting being upset about OT telling MD yesterday that she could not go home d/t needing assist with standing; pt reporting she could stand with PT yesterday really well.  Currently pt is SBA with bed mobility and CGA ambulating 200 feet with RW.  In terms of transfer assist levels during session: pt mod assist to stand from elevated bed height x1 trial; min assist to stand from [facility] x1 trial; CGA to stand from bed height at 24 inches (x1 trial); min assist to stand from bed height 23 inches (x2 trials); vc's for UE/LE placement and overall technique required.  Pt's LE's fatigued with activities and pt also reporting increased pain in LE's during session (9/10).  Pt reports she feels she could do better standing if she had less pain in her LE's and that she was able to stand better yesterday.  Pt would benefit from skilled PT to address noted impairments and functional limitations (see below for any additional details).  Upon hospital discharge, pt would benefit from SNF but therapy will continue to work towards pt's goal of home discharge.  PT POC reviewed and updated.   Recommendations for follow up therapy are one component of a multi-disciplinary discharge planning process, led by the attending physician.  Recommendations may be updated based on patient status, additional functional criteria and insurance authorization.  Follow Up Recommendations Skilled  nursing-short term rehab (<3 hours/day)    Assistance Recommended at Discharge Intermittent Supervision/Assistance  Functional Status Assessment Patient has had a recent decline in their functional status and demonstrates the ability to make significant improvements in function in a reasonable and predictable amount of time.  Equipment Recommendations  Rolling walker (2 wheels)    Recommendations for Other Services OT consult     Precautions / Restrictions Precautions Precautions: Fall Precaution Comments: monitor HR Restrictions Weight Bearing Restrictions: No      Mobility  Bed Mobility Overal bed mobility: Needs Assistance Bed Mobility: Supine to Sit;Sit to Supine     Supine to sit: Supervision;HOB elevated Sit to supine: Supervision;HOB elevated   General bed mobility comments: mild increased effort to perform on own    Transfers Overall transfer level: Needs assistance Equipment used: Rolling walker (2 wheels) Transfers: Sit to/from Stand Sit to Stand: Min guard;Min assist;From elevated surface Stand pivot transfers: Min guard (stand step turn bed to/from [facility])         General transfer comment: mod assist to stand from elevated bed height x1 trial; min assist to stand from [facility] x1 trial; CGA to stand from bed height at 24 inches (x1 trial); min assist to stand from bed height 23 inches (x2 trials); vc's for UE/LE placement and overall technique required    Ambulation/Gait Ambulation/Gait assistance: Min guard Gait Distance (Feet): 200 Feet Assistive device: Rolling walker (2 wheels) Gait Pattern/deviations: WFL(Within Functional Limits)     General Gait Details: steady ambulation  Stairs            Wheelchair Mobility    Modified Rankin (Stroke Patients Only)  Balance Overall balance assessment: Needs assistance Sitting-balance support: No upper extremity supported;Feet unsupported Sitting balance-Leahy Scale: Good Sitting balance -  Comments: steady sitting reaching within BOS   Standing balance support: No upper extremity supported   Standing balance comment: steady static standing briefly without UE support                             Pertinent Vitals/Pain Pain Assessment: 0-10 Pain Score: 9  Pain Location: B LE and B feet pain from neuropathy Pain Descriptors / Indicators: Sore Pain Intervention(s): Limited activity within patient's tolerance;Monitored during session;Repositioned;Patient requesting pain meds-RN notified Vitals (HR and O2 on room air) stable and WFL throughout treatment session.    Home Living Family/patient expects to be discharged to:: Private residence Living Arrangements: Alone Available Help at Discharge: Friend(s);Neighbor;Available PRN/intermittently Type of Home: House Home Access: Stairs to enter Entrance Stairs-Rails: Can reach both Entrance Stairs-Number of Steps: 5   Home Layout: One level   Additional Comments: Per previous therapy evaluation: "Has not been able to walk or navigate steps to enter home in 2-3 months. When she was walking she used a Rollator. Pt "lost" w/c when car was repo-ed."    Prior Function               Mobility Comments: Pt reports being ambulatory with rollator.  Has lift chair; BSC over toilet (at highest height available).       Hand Dominance        Extremity/Trunk Assessment   Upper Extremity Assessment Upper Extremity Assessment: Generalized weakness    Lower Extremity Assessment Lower Extremity Assessment: Generalized weakness    Cervical / Trunk Assessment Cervical / Trunk Assessment: Normal  Communication   Communication: No difficulties  Cognition Arousal/Alertness: Awake/alert Behavior During Therapy: WFL for tasks assessed/performed Overall Cognitive Status: Within Functional Limits for tasks assessed                                          General Comments  Nursing cleared pt for  participation in physical therapy.  Pt agreeable to PT session.  Therapist set pt up with home-made slippers during session (per pt request to simulate what physical therapist did for pt last session d/t B feet pain).    Exercises  Transfer and gait training   Assessment/Plan    PT Assessment Patient needs continued PT services  PT Problem List Decreased strength;Decreased mobility;Decreased activity tolerance;Decreased balance;Pain;Decreased knowledge of use of DME;Decreased knowledge of precautions       PT Treatment Interventions Therapeutic exercise;Gait training;Balance training;Neuromuscular re-education;Functional mobility training;Stair training;Therapeutic activities;Patient/family education;DME instruction    PT Goals (Current goals can be found in the Care Plan section)  Acute Rehab PT Goals Patient Stated Goal: improve transfers and walking PT Goal Formulation: With patient Time For Goal Achievement: 09/30/21 Potential to Achieve Goals: Good    Frequency Min 2X/week   Barriers to discharge Inaccessible home environment;Decreased caregiver support      Co-evaluation               AM-PAC PT "6 Clicks" Mobility  Outcome Measure Help needed turning from your back to your side while in a flat bed without using bedrails?: None Help needed moving from lying on your back to sitting on the side of a flat bed without using bedrails?: A Little Help needed  moving to and from a bed to a chair (including a wheelchair)?: A Little Help needed standing up from a chair using your arms (e.g., wheelchair or bedside chair)?: A Lot Help needed to walk in hospital room?: A Little Help needed climbing 3-5 steps with a railing? : A Lot 6 Click Score: 17    End of Session Equipment Utilized During Treatment: Gait belt Activity Tolerance: Patient limited by pain Patient left: in bed;with call bell/phone within reach;with bed alarm set Nurse Communication: Mobility  status;Precautions;Patient requests pain meds PT Visit Diagnosis: Muscle weakness (generalized) (M62.81);Adult, failure to thrive (R62.7)    Time: 1006-1102 PT Time Calculation (min) (ACUTE ONLY): 56 min   Charges:   PT Evaluation $PT Re-evaluation: 1 Re-eval PT Treatments $Gait Training: 8-22 mins $Therapeutic Activity: 23-37 mins       Hendricks Limes, PT 09/16/21, 12:40 PM

## 2021-09-16 NOTE — Progress Notes (Signed)
Nutrition Follow-up  DOCUMENTATION CODES:   Non-severe (moderate) malnutrition in context of chronic illness  INTERVENTION:   -Ensure Enlive po TID, each supplement provides 350 kcal and 20 grams of protein  -Continue MVI with minerals daily -D/c Magic cup TID with meals, each supplement provides 290 kcal and 9 grams of protein   NUTRITION DIAGNOSIS:   Moderate Malnutrition related to chronic illness (cirrhosis) as evidenced by mild fat depletion, moderate fat depletion, mild muscle depletion, moderate muscle depletion.  Ongoing  GOAL:   Patient will meet greater than or equal to 90% of their needs  Progressing   MONITOR:   PO intake, Supplement acceptance, Labs, Weight trends, Skin, I & O's  REASON FOR ASSESSMENT:   LOS    ASSESSMENT:   60 yo female with a PMH of paroxysmal atrial fibrillation not on anticoagulation due to history of GI bleed, alcoholic liver cirrhosis, chronic anemia.  She was brought to the hospital at the recommendation of DSS after she was found sitting at home in her feces.  She complained of generalized weakness and poor oral intake.  She felt so weak that she was unable to get up from her chair.  Reviewed I/O's: +600 ml x 24 hours and -2.2 L since 09/02/21  Spoke with pt at bedside, who reports poor appetite. She shares that she has been consuming mostly sweet potatoes. Noted meal completions 50-100%. Pt complained that her pizza had "little black things" on it yesterday and did not eat it (RD suspects these were olives). RD took pt lunch order (pizza, chocolate chip cookie, ice cream, and soda).   Pt shares her UBW is around 150#, but has lost to about 130#. She is unsure of of time frame of weight loss. She also expressed concern about how thin her leg are.   Discussed importance of good meal and supplement intake to promote healing. Pt amenable to Ensure supplements, but does not like the [employer].    Medications reviewed and include remeron,  miralax, thiamine, and vitamin D.   Per TOC notes, pt is medically stable for discharge, but awaiting SNF placement.   Labs reviewed: Phos: 5.8.  NUTRITION - FOCUSED PHYSICAL EXAM:  Flowsheet Row Most Recent Value  Orbital Region No depletion  Upper Arm Region Moderate depletion  Thoracic and Lumbar Region Mild depletion  Buccal Region No depletion  Temple Region No depletion  Clavicle Bone Region Moderate depletion  Clavicle and Acromion Bone Region Moderate depletion  Scapular Bone Region Moderate depletion  Dorsal Hand Mild depletion  Patellar Region Moderate depletion  Anterior Thigh Region Moderate depletion  Posterior Calf Region Moderate depletion  Edema (RD Assessment) None  Hair Reviewed  Eyes Reviewed  Mouth Reviewed  Skin Reviewed  Nails Reviewed       Diet Order:   Diet Order             Diet regular Room service appropriate? Yes; Fluid consistency: Thin  Diet effective now                   EDUCATION NEEDS:   Education needs have been addressed  Skin:  Skin Assessment: Skin Integrity Issues: Skin Integrity Issues:: DTI DTI: Buttocks  Last BM:  09/14/21  Height:   Ht Readings from Last 1 Encounters:  08/30/21 5\' 6"  (1.676 m)    Weight:   Wt Readings from Last 1 Encounters:  08/30/21 62.1 kg   BMI:  Body mass index is 22.11 kg/m.  Estimated Nutritional  Needs:   Kcal:  1800-2000  Protein:  85-100 grams  Fluid:  >1.8 L    Levada Schilling, RD, LDN, CDCES Registered Dietitian II Certified Diabetes Care and Education Specialist Please refer to [facility] for RD and/or RD on-call/weekend/after hours pager

## 2021-09-16 NOTE — Progress Notes (Signed)
PROGRESS NOTE    Jamie Wilkins  JKK:[id number] DOB: [birth year] DOA: 08/30/2021 PCP: [facility]   Brief Narrative:  60 y.o. BF PMHx paroxysmal atrial fibrillation not on anticoagulation due to history of GI bleed, alcoholic liver cirrhosis, chronic anemia.    Brought to the hospital at the recommendation of DSS after she was found sitting at home in her feces.  She complained of generalized weakness and poor oral intake.  She felt so weak that she was unable to get up from her chair.    She was admitted to the hospital for dehydration, hypotension, hypokalemia, hypomagnesemia, hypophosphatemia and hypocalcemia.  She was treated with IV fluids and her electrolytes were repleted.  Despite adequate IV resuscitation, blood pressure remained low.  Jamie Wilkins said that her blood pressure normally runs low.  She was started on midodrine for hypotension.  She also atrial fibrillation with RVR but rate control drugs were avoided because of hypotension.  Fortunately, her heart rate has improved.   She was seen by the psychiatrist for depression and suicidal ideation.  She was started on mirtazapine for depression.  She was initially placed on IVC but this has been revoked.   She was evaluated by PT and OT who recommended further rehabilitation at SNF."   Subjective: 10/27 A/O x4, Jamie Wilkins much more cooperative today understands that she cannot stay by her self.  Proposes that she stays with her son and daughter-in-law in [place].  Requests to speak with NCM/social worker in order to facilitate discharge to that area.   Assessment & Plan:  Covid vaccination;  Principal Problem:   Severe major depression, single episode, without psychotic features (HCC) Active Problems:   Hypocalcemia   Hypokalemia   Hypomagnesemia   Alcoholic hepatitis without ascites   AF (paroxysmal atrial fibrillation) (HCC)   Hypotension   Depression due to physical illness   Decubitus ulcer    Delusions (HCC)   Acute metabolic encephalopathy /Hospital Delirium  -10/22-25: Jamie Wilkins having intermittent spells of confusion and disorientation at times consistent with delirium. VBG without hypercapnia and ammonia only minimally elevated, not enough to cause confusion. --Jamie Wilkins is afebrile without leukocytosis or any signs symptoms of infection.   --Monitor clinically, further evaluation if we see signs or symptoms of infection or neurologic changes. --Delirium precautions  Delusions - 10/26 Jamie Wilkins delusional.  Therefore LACKS CAPACITY to make medical decisions.  NO HOME support.  Lives alone. -10/27 much more realistic about current health issues.  Understands needs 24-hour care, and has proposed realistic discharge plan involving staying with son and daughter-in-law in [place].  Depression with suicidal ideation:  stable without SI or HI.   Continue mirtazapine.   IVC has been revoked.  Psychiatrist has signed off.   Acute on chronic hypotension, dehydration:  -BP remains soft/low but stable. -Continue midodrine. -Maintain MAP > 65   Bilateral foot pain - Plantar Fascitis (less likely peripheral neuropathy).   Improved. Jamie Wilkins reports now tolerating ambulation well. Tylenol PRN.  Trial of topical lidocaine.   Recommend nighttime splints for this if persistent.   Continue PT.   Paroxysmal atrial fibrillation with RVR:  HR now controlled.  Rate control agents limited by hypotension.     EtOH -No active s/sx's of withdrawal. Counseled regarding cessation of alcohol use and its harmful health effects.  Continue thiamine and multivitamins.   Monitored on CIWA protocol.  ETOH liver cirrhosis:  -Compensated, no acute issues. Monitor.   Anemia of chronic disease, folic acid deficiency:  Hbg  stable.   Continue folic acid. Monitor CBC.  Hypokalemia, hypomagnesemia, hypocalcemia and hypophosphatemia:  Improved with replacement.  Monitor and replace as  needed.  Vitamin D deficiency:  Vitamin D level is 20.3.   Continue vitamin D supplement.   Was taking vitamin D2 50,000 units once a week.    Hypocalcemia -Calcium= 10, will continue to monitor      Right buttock deep tissue injury  Pressure Injury 08/31/21 Buttocks Right Deep Tissue Pressure Injury - Purple or maroon localized area of discolored intact skin or blood-filled blister due to damage of underlying soft tissue from pressure and/or shear. (Active)  08/31/21 1800  Location: Buttocks  Location Orientation: Right  Staging: Deep Tissue Pressure Injury - Purple or maroon localized area of discolored intact skin or blood-filled blister due to damage of underlying soft tissue from pressure and/or shear.  Wound Description (Comments):   Present on Admission: Yes  -Continue local wound care.    DVT prophylaxis: Lovenox  code Status: Full Family Communication: 10/27 son at bedside for discussion of plan of care all questions answered Status is: Inpatient    Dispo: The Jamie Wilkins is from: Home              Anticipated d/c is to: SNF              Anticipated d/c date is: > 3 days              Jamie Wilkins currently is not medically stable to d/c.      Consultants:  Psychiatry  Procedures/Significant Events:     I have personally reviewed and interpreted all radiology studies and my findings are as above.  VENTILATOR SETTINGS:    Cultures   Antimicrobials:    Devices    LINES / TUBES:      Continuous Infusions:   Objective: Vitals:   09/16/21 0356 09/16/21 0842 09/16/21 1113 09/16/21 1712  BP: (!) 131/95 100/72 97/72 113/82  Pulse: 89 94 91 83  Resp: 16 16 16 16   Temp: 98.6 F (37 C) 98.2 F (36.8 C) 98 F (36.7 C) 98.6 F (37 C)  TempSrc: Oral Oral Oral Oral  SpO2: 93% 95% 95% 98%  Weight:      Height:       No intake or output data in the 24 hours ending 09/16/21 1813  Filed Weights   08/30/21 1936  Weight: 62.1 kg     Examination:  General: A/O x4, No acute respiratory distress Eyes: negative scleral hemorrhage, negative anisocoria, negative icterus ENT: Negative Runny nose, negative gingival bleeding, Neck:  Negative scars, masses, torticollis, lymphadenopathy, JVD Lungs: Clear to auscultation bilaterally without wheezes or crackles Cardiovascular: Regular rate and rhythm without murmur gallop or rub normal S1 and S2 Abdomen: negative abdominal pain, nondistended, positive soft, bowel sounds, no rebound, no ascites, no appreciable mass Extremities: No significant cyanosis, clubbing, or edema bilateral lower extremities Skin: Negative rashes, lesions, ulcers Psychiatric:  Negative depression, negative anxiety, negative fatigue, negative mania, POSITIVE delusions, POSITIVE extremely poor insight Central nervous system:  Cranial nerves II through XII intact, tongue/uvula midline, all extremities muscle strength 5/5 (when sitting).  Jamie Wilkins unable to rise from seated position without moderate assist., sensation intact throughout, negative dysarthria, negative expressive aphasia, negative receptive aphasia.  .     Data Reviewed: Care during the described time interval was provided by me .  I have reviewed this Jamie Wilkins's available data, including medical history, events of note, physical examination, and all test  results as part of my evaluation.   CBC: Recent Labs  Lab 09/13/21 0436 09/15/21 0756 09/16/21 0611  WBC 3.8* 4.2 4.1  NEUTROABS  --  1.9 2.0  HGB 7.8* 7.6* 7.9*  HCT 24.6* 23.6* 24.2*  MCV 100.8* 102.2* 100.0  PLT 235 238 261    Basic Metabolic Panel: Recent Labs  Lab 09/11/21 0404 09/15/21 0756 09/16/21 0611  NA 138 139 141  K 4.4 4.7 4.7  CL 106 107 107  CO2 27 27 27   GLUCOSE 77 78 71  BUN 9 8 9   CREATININE 1.07* 0.93 1.09*  CALCIUM 7.9* 8.0* 8.1*  MG  --  2.0 2.0  PHOS  --  5.8* 5.8*    GFR: Estimated Creatinine Clearance: 50.7 mL/min (A) (by C-G formula based on  SCr of 1.09 mg/dL (H)). Liver Function Tests: Recent Labs  Lab 09/15/21 0756 09/16/21 0611  AST 46* 42*  ALT 21 19  ALKPHOS 137* 130*  BILITOT 0.6 0.6  PROT 5.0* 5.2*  ALBUMIN 1.5* 1.6*    No results for input(s): LIPASE, AMYLASE in the last 168 hours. Recent Labs  Lab 09/12/21 0802  AMMONIA 38*    Coagulation Profile: No results for input(s): INR, PROTIME in the last 168 hours. Cardiac Enzymes: No results for input(s): CKTOTAL, CKMB, CKMBINDEX, TROPONINI in the last 168 hours. BNP (last 3 results) No results for input(s): PROBNP in the last 8760 hours. HbA1C: No results for input(s): HGBA1C in the last 72 hours. CBG: No results for input(s): GLUCAP in the last 168 hours. Lipid Profile: No results for input(s): CHOL, HDL, LDLCALC, TRIG, CHOLHDL, LDLDIRECT in the last 72 hours. Thyroid Function Tests: No results for input(s): TSH, T4TOTAL, FREET4, T3FREE, THYROIDAB in the last 72 hours. Anemia Panel: Recent Labs    09/16/21 0611  VITAMINB12 515  FOLATE 17.0  FERRITIN 1,307*  TIBC 129*  IRON 37  RETICCTPCT 3.6*    Urine analysis:    Component Value Date/Time   COLORURINE YELLOW (A) 09/04/2021 0507   APPEARANCEUR HAZY (A) 09/04/2021 0507   LABSPEC 1.004 (L) 09/04/2021 0507   PHURINE 6.0 09/04/2021 0507   GLUCOSEU NEGATIVE 09/04/2021 0507   HGBUR SMALL (A) 09/04/2021 0507   BILIRUBINUR NEGATIVE 09/04/2021 0507   KETONESUR NEGATIVE 09/04/2021 0507   PROTEINUR NEGATIVE 09/04/2021 0507   NITRITE NEGATIVE 09/04/2021 0507   LEUKOCYTESUR LARGE (A) 09/04/2021 0507   Sepsis Labs: @LABRCNTIP (procalcitonin:4,lacticidven:4)  )No results found for this or any previous visit (from the past 240 hour(s)).       Radiology Studies: No results found.      Scheduled Meds:  enoxaparin (LOVENOX) injection  40 mg Subcutaneous Q24H   feeding supplement  237 mL Oral TID BM   folic acid  1 mg Oral Daily   midodrine  5 mg Oral TID WC   mirtazapine  30 mg Oral QHS    multivitamin with minerals  1 tablet Oral Daily   nicotine  14 mg Transdermal Daily   polyethylene glycol  17 g Oral Daily   thiamine  100 mg Oral Daily   Or   thiamine  100 mg Intravenous Daily   Vitamin D (Ergocalciferol)  50,000 Units Oral Q7 days   Continuous Infusions:   LOS: 16 days   The Jamie Wilkins is critically ill with multiple organ systems failure and requires high complexity decision making for assessment and support, frequent evaluation and titration of therapies, application of advanced monitoring technologies and extensive interpretation of multiple databases. Critical  Care Time devoted to Jamie Wilkins care services described in this note  Time spent: 40 minutes     Jasalyn Frysinger, Roselind Messier, MD [facility]   If 7PM-7AM, please contact night-coverage 09/16/2021, 6:13 PM

## 2021-09-16 NOTE — Progress Notes (Signed)
Patient continues to refuse to allow nursing staff to change bed pad and linens. Patient did allow vital signs to be taken.

## 2021-09-17 LAB — COMPREHENSIVE METABOLIC PANEL
ALT: 19 U/L (ref 0–44)
AST: 41 U/L (ref 15–41)
Albumin: 1.6 g/dL — ABNORMAL LOW (ref 3.5–5.0)
Alkaline Phosphatase: 120 U/L (ref 38–126)
Anion gap: 4 — ABNORMAL LOW (ref 5–15)
BUN: 10 mg/dL (ref 8–23)
CO2: 28 mmol/L (ref 22–32)
Calcium: 7.9 mg/dL — ABNORMAL LOW (ref 8.9–10.3)
Chloride: 106 mmol/L (ref 98–111)
Creatinine, Ser: 1.18 mg/dL — ABNORMAL HIGH (ref 0.44–1.00)
GFR, Estimated: 53 mL/min — ABNORMAL LOW (ref >60)
Glucose, Bld: 78 mg/dL (ref 70–99)
Potassium: 4.4 mmol/L (ref 3.5–5.1)
Sodium: 138 mmol/L (ref 135–145)
Total Bilirubin: 0.6 mg/dL (ref 0.3–1.2)
Total Protein: 5 g/dL — ABNORMAL LOW (ref 6.5–8.1)

## 2021-09-17 LAB — CBC WITH DIFFERENTIAL/PLATELET
Abs Immature Granulocytes: 0.01 10*3/uL (ref 0.00–0.07)
Basophils Absolute: 0 10*3/uL (ref 0.0–0.1)
Basophils Relative: 1 %
Eosinophils Absolute: 0.1 10*3/uL (ref 0.0–0.5)
Eosinophils Relative: 3 %
HCT: 26.1 % — ABNORMAL LOW (ref 36.0–46.0)
Hemoglobin: 8.2 g/dL — ABNORMAL LOW (ref 12.0–15.0)
Immature Granulocytes: 0 %
Lymphocytes Relative: 40 %
Lymphs Abs: 1.6 10*3/uL (ref 0.7–4.0)
MCH: 31.5 pg (ref 26.0–34.0)
MCHC: 31.4 g/dL (ref 30.0–36.0)
MCV: 100.4 fL — ABNORMAL HIGH (ref 80.0–100.0)
Monocytes Absolute: 0.4 10*3/uL (ref 0.1–1.0)
Monocytes Relative: 11 %
Neutro Abs: 1.8 10*3/uL (ref 1.7–7.7)
Neutrophils Relative %: 45 %
Platelets: 275 10*3/uL (ref 150–400)
RBC: 2.6 MIL/uL — ABNORMAL LOW (ref 3.87–5.11)
RDW: 16.9 % — ABNORMAL HIGH (ref 11.5–15.5)
WBC: 3.9 10*3/uL — ABNORMAL LOW (ref 4.0–10.5)
nRBC: 0 % (ref 0.0–0.2)

## 2021-09-17 LAB — MAGNESIUM: Magnesium: 2.1 mg/dL (ref 1.7–2.4)

## 2021-09-17 LAB — HAPTOGLOBIN: Haptoglobin: 115 mg/dL (ref 37–355)

## 2021-09-17 LAB — PHOSPHORUS: Phosphorus: 5.2 mg/dL — ABNORMAL HIGH (ref 2.5–4.6)

## 2021-09-17 MED ORDER — ALBUMIN HUMAN 25 % IV SOLN
25.0000 g | Freq: Once | INTRAVENOUS | Status: AC
  Administered 2021-09-17: 12:00:00 25 g via INTRAVENOUS
  Filled 2021-09-17: qty 100

## 2021-09-17 MED ORDER — ALBUMIN HUMAN 25 % IV SOLN
50.0000 g | Freq: Once | INTRAVENOUS | Status: DC
  Filled 2021-09-17: qty 200

## 2021-09-17 MED ORDER — ALBUMIN HUMAN 25 % IV SOLN
25.0000 g | Freq: Once | INTRAVENOUS | Status: AC
  Administered 2021-09-17: 13:00:00 25 g via INTRAVENOUS
  Filled 2021-09-17: qty 100

## 2021-09-17 MED ORDER — MIDODRINE HCL 5 MG PO TABS
10.0000 mg | ORAL_TABLET | Freq: Three times a day (TID) | ORAL | Status: DC
  Administered 2021-09-18 – 2021-09-21 (×11): 10 mg via ORAL
  Filled 2021-09-17 (×11): qty 2

## 2021-09-17 NOTE — Progress Notes (Signed)
   09/17/21 0748  Vitals  BP (!) 88/61  MAP (mmHg) 69  Notified MD woods pt will get midodrine

## 2021-09-17 NOTE — Progress Notes (Signed)
PROGRESS NOTE    Jamie Wilkins  TGY:[id number] DOB: 1961-02-11 DOA: 08/30/2021 PCP: [facility]   Brief Narrative:  60 y.o. BF PMHx paroxysmal atrial fibrillation not on anticoagulation due to history of GI bleed, alcoholic liver cirrhosis, chronic anemia.    Brought to the hospital at the recommendation of DSS after she was found sitting at home in her feces.  She complained of generalized weakness and poor oral intake.  She felt so weak that she was unable to get up from her chair.    She was admitted to the hospital for dehydration, hypotension, hypokalemia, hypomagnesemia, hypophosphatemia and hypocalcemia.  She was treated with IV fluids and her electrolytes were repleted.  Despite adequate IV resuscitation, blood pressure remained low.  Patient said that her blood pressure normally runs low.  She was started on midodrine for hypotension.  She also atrial fibrillation with RVR but rate control drugs were avoided because of hypotension.  Fortunately, her heart rate has improved.   She was seen by the psychiatrist for depression and suicidal ideation.  She was started on mirtazapine for depression.  She was initially placed on IVC but this has been revoked.   She was evaluated by PT and OT who recommended further rehabilitation at SNF."   Subjective: 10/28 A/O x4, states social worker spoke with her son (no known chart).  Also states that she believes that Medicaid has accepted her application.  Believes Medicaid has suspense check to her and is going to have a friend check her house for check Paperwork.    Assessment & Plan:  Covid vaccination;  Principal Problem:   Severe major depression, single episode, without psychotic features (HCC) Active Problems:   Hypocalcemia   Hypokalemia   Hypomagnesemia   Alcoholic hepatitis without ascites   AF (paroxysmal atrial fibrillation) (HCC)   Hypotension   Depression due to physical illness   Decubitus ulcer    Delusions (HCC)   Acute metabolic encephalopathy /Hospital Delirium  -10/22-25: Patient having intermittent spells of confusion and disorientation at times consistent with delirium. VBG without hypercapnia and ammonia only minimally elevated, not enough to cause confusion. --Patient is afebrile without leukocytosis or any signs symptoms of infection.   --Monitor clinically, further evaluation if we see signs or symptoms of infection or neurologic changes. --Delirium precautions  Delusions - 10/26 patient delusional.  Therefore LACKS CAPACITY to make medical decisions.  NO HOME support.  Lives alone. -10/27 much more realistic about current health issues.  Understands needs 24-hour care, and has proposed realistic discharge plan involving staying with son and daughter-in-law in [place].  Depression with suicidal ideation:  stable without SI or HI.   Continue mirtazapine.   IVC has been revoked.  Psychiatrist has signed off.   Acute on chronic hypotension, dehydration:  -BP remains soft/low but stable. -Continue midodrine. -Maintain MAP > 65   Bilateral foot pain - Plantar Fascitis (less likely peripheral neuropathy).   Improved. Patient reports now tolerating ambulation well. Tylenol PRN.  Trial of topical lidocaine.   Recommend nighttime splints for this if persistent.   Continue PT.   Paroxysmal atrial fibrillation with RVR:  HR now controlled.  Rate control agents limited by hypotension.    Hypotension - 10/28 Albumin 50 g x 1 - 10/28 increase Midodrine 10 mg TID    EtOH -No active s/sx's of withdrawal. Counseled regarding cessation of alcohol use and its harmful health effects.  Continue thiamine and multivitamins.   Monitored on CIWA protocol.  ETOH liver cirrhosis:  -Compensated, no acute issues. Monitor.   Anemia of chronic disease, folic acid deficiency:  Hbg stable.   Continue folic acid. Monitor CBC.  Hypokalemia, hypomagnesemia, hypocalcemia and  hypophosphatemia:  Improved with replacement.  Monitor and replace as needed.  Vitamin D deficiency:  Vitamin D level is 20.3.   Continue vitamin D supplement.   Was taking vitamin D2 50,000 units once a week.    Hypocalcemia -Calcium= 10, will continue to monitor      Right buttock deep tissue injury  Pressure Injury 08/31/21 Buttocks Right Deep Tissue Pressure Injury - Purple or maroon localized area of discolored intact skin or blood-filled blister due to damage of underlying soft tissue from pressure and/or shear. (Active)  08/31/21 1800  Location: Buttocks  Location Orientation: Right  Staging: Deep Tissue Pressure Injury - Purple or maroon localized area of discolored intact skin or blood-filled blister due to damage of underlying soft tissue from pressure and/or shear.  Wound Description (Comments):   Present on Admission: Yes  -Continue local wound care.    DVT prophylaxis: Lovenox  code Status: Full Family Communication: 10/27 son at bedside for discussion of plan of care all questions answered Status is: Inpatient    Dispo: The patient is from: Home              Anticipated d/c is to: SNF              Anticipated d/c date is: > 3 days              Patient currently is not medically stable to d/c.      Consultants:  Psychiatry  Procedures/Significant Events:     I have personally reviewed and interpreted all radiology studies and my findings are as above.  VENTILATOR SETTINGS:    Cultures   Antimicrobials:    Devices    LINES / TUBES:      Continuous Infusions:  albumin human       Objective: Vitals:   09/16/21 1941 09/17/21 0454 09/17/21 0719 09/17/21 0748  BP: 136/81 127/90 (!) 74/58 (!) 88/61  Pulse: 69 92 77 80  Resp: 17 16 18 17   Temp: 98.3 F (36.8 C) 98.3 F (36.8 C) 97.8 F (36.6 C) 97.8 F (36.6 C)  TempSrc: Oral Oral  Oral  SpO2: 100% 96% 96%   Weight:      Height:        Intake/Output Summary (Last 24  hours) at 09/17/2021 0920 Last data filed at 09/17/2021 0703 Gross per 24 hour  Intake 358 ml  Output 700 ml  Net -342 ml    Filed Weights   08/30/21 1936  Weight: 62.1 kg    Examination:  General: A/O x4, No acute respiratory distress Eyes: negative scleral hemorrhage, negative anisocoria, negative icterus ENT: Negative Runny nose, negative gingival bleeding, Neck:  Negative scars, masses, torticollis, lymphadenopathy, JVD Lungs: Clear to auscultation bilaterally without wheezes or crackles Cardiovascular: Regular rate and rhythm without murmur gallop or rub normal S1 and S2 Abdomen: negative abdominal pain, nondistended, positive soft, bowel sounds, no rebound, no ascites, no appreciable mass Extremities: No significant cyanosis, clubbing, or edema bilateral lower extremities Skin: Negative rashes, lesions, ulcers Psychiatric:  Negative depression, negative anxiety, negative fatigue, negative mania, POSITIVE delusions, POSITIVE extremely poor insight Central nervous system:  Cranial nerves II through XII intact, tongue/uvula midline, all extremities muscle strength 5/5 (when sitting).  Patient unable to rise from seated position without moderate assist.,  sensation intact throughout, negative dysarthria, negative expressive aphasia, negative receptive aphasia.  .     Data Reviewed: Care during the described time interval was provided by me .  I have reviewed this patient's available data, including medical history, events of note, physical examination, and all test results as part of my evaluation.   CBC: Recent Labs  Lab 09/13/21 0436 09/15/21 0756 09/16/21 0611 09/17/21 0612  WBC 3.8* 4.2 4.1 3.9*  NEUTROABS  --  1.9 2.0 1.8  HGB 7.8* 7.6* 7.9* 8.2*  HCT 24.6* 23.6* 24.2* 26.1*  MCV 100.8* 102.2* 100.0 100.4*  PLT 235 238 261 275    Basic Metabolic Panel: Recent Labs  Lab 09/11/21 0404 09/15/21 0756 09/16/21 0611 09/17/21 0612  NA 138 139 141 138  K 4.4 4.7  4.7 4.4  CL 106 107 107 106  CO2 27 27 27 28   GLUCOSE 77 78 71 78  BUN 9 8 9 10   CREATININE 1.07* 0.93 1.09* 1.18*  CALCIUM 7.9* 8.0* 8.1* 7.9*  MG  --  2.0 2.0 2.1  PHOS  --  5.8* 5.8* 5.2*    GFR: Estimated Creatinine Clearance: 46.9 mL/min (A) (by C-G formula based on SCr of 1.18 mg/dL (H)). Liver Function Tests: Recent Labs  Lab 09/15/21 0756 09/16/21 0611 09/17/21 0612  AST 46* 42* 41  ALT 21 19 19   ALKPHOS 137* 130* 120  BILITOT 0.6 0.6 0.6  PROT 5.0* 5.2* 5.0*  ALBUMIN 1.5* 1.6* 1.6*    No results for input(s): LIPASE, AMYLASE in the last 168 hours. Recent Labs  Lab 09/12/21 0802  AMMONIA 38*    Coagulation Profile: No results for input(s): INR, PROTIME in the last 168 hours. Cardiac Enzymes: No results for input(s): CKTOTAL, CKMB, CKMBINDEX, TROPONINI in the last 168 hours. BNP (last 3 results) No results for input(s): PROBNP in the last 8760 hours. HbA1C: No results for input(s): HGBA1C in the last 72 hours. CBG: No results for input(s): GLUCAP in the last 168 hours. Lipid Profile: No results for input(s): CHOL, HDL, LDLCALC, TRIG, CHOLHDL, LDLDIRECT in the last 72 hours. Thyroid Function Tests: No results for input(s): TSH, T4TOTAL, FREET4, T3FREE, THYROIDAB in the last 72 hours. Anemia Panel: Recent Labs    09/16/21 0611  VITAMINB12 515  FOLATE 17.0  FERRITIN 1,307*  TIBC 129*  IRON 37  RETICCTPCT 3.6*    Urine analysis:    Component Value Date/Time   COLORURINE YELLOW (A) 09/04/2021 0507   APPEARANCEUR HAZY (A) 09/04/2021 0507   LABSPEC 1.004 (L) 09/04/2021 0507   PHURINE 6.0 09/04/2021 0507   GLUCOSEU NEGATIVE 09/04/2021 0507   HGBUR SMALL (A) 09/04/2021 0507   BILIRUBINUR NEGATIVE 09/04/2021 0507   KETONESUR NEGATIVE 09/04/2021 0507   PROTEINUR NEGATIVE 09/04/2021 0507   NITRITE NEGATIVE 09/04/2021 0507   LEUKOCYTESUR LARGE (A) 09/04/2021 0507   Sepsis Labs: @LABRCNTIP (procalcitonin:4,lacticidven:4)  )No results found for  this or any previous visit (from the past 240 hour(s)).       Radiology Studies: No results found.      Scheduled Meds:  enoxaparin (LOVENOX) injection  40 mg Subcutaneous Q24H   feeding supplement  237 mL Oral TID BM   folic acid  1 mg Oral Daily   midodrine  5 mg Oral TID WC   mirtazapine  30 mg Oral QHS   multivitamin with minerals  1 tablet Oral Daily   nicotine  14 mg Transdermal Daily   polyethylene glycol  17 g Oral Daily  thiamine  100 mg Oral Daily   Or   thiamine  100 mg Intravenous Daily   Vitamin D (Ergocalciferol)  50,000 Units Oral Q7 days   Continuous Infusions:  albumin human       LOS: 17 days   The patient is critically ill with multiple organ systems failure and requires high complexity decision making for assessment and support, frequent evaluation and titration of therapies, application of advanced monitoring technologies and extensive interpretation of multiple databases. Critical Care Time devoted to patient care services described in this note  Time spent: 40 minutes     Maybelline Kolarik, Roselind Messier, MD [facility]   If 7PM-7AM, please contact night-coverage 09/17/2021, 9:20 AM

## 2021-09-17 NOTE — Progress Notes (Signed)
OT Cancellation Note  Patient Details Name: Jamie Wilkins MRN: [id number] DOB: 1961-11-19   Cancelled Treatment:    Reason Eval/Treat Not Completed: Other (comment) (Pt declined, agitated, and SN administering Albumin for BP)  Danelle Earthly, MS, OTR/L  Jamie Wilkins 09/17/2021, 11:47 AM

## 2021-09-18 LAB — COMPREHENSIVE METABOLIC PANEL
ALT: 18 U/L (ref 0–44)
AST: 36 U/L (ref 15–41)
Albumin: 2.3 g/dL — ABNORMAL LOW (ref 3.5–5.0)
Alkaline Phosphatase: 90 U/L (ref 38–126)
Anion gap: 3 — ABNORMAL LOW (ref 5–15)
BUN: 8 mg/dL (ref 8–23)
CO2: 27 mmol/L (ref 22–32)
Calcium: 8.3 mg/dL — ABNORMAL LOW (ref 8.9–10.3)
Chloride: 108 mmol/L (ref 98–111)
Creatinine, Ser: 0.96 mg/dL (ref 0.44–1.00)
GFR, Estimated: >60 mL/min (ref >60)
Glucose, Bld: 71 mg/dL (ref 70–99)
Potassium: 4.7 mmol/L (ref 3.5–5.1)
Sodium: 138 mmol/L (ref 135–145)
Total Bilirubin: 0.7 mg/dL (ref 0.3–1.2)
Total Protein: 5.1 g/dL — ABNORMAL LOW (ref 6.5–8.1)

## 2021-09-18 LAB — CBC WITH DIFFERENTIAL/PLATELET
Abs Immature Granulocytes: 0.01 10*3/uL (ref 0.00–0.07)
Basophils Absolute: 0 10*3/uL (ref 0.0–0.1)
Basophils Relative: 1 %
Eosinophils Absolute: 0.1 10*3/uL (ref 0.0–0.5)
Eosinophils Relative: 3 %
HCT: 22 % — ABNORMAL LOW (ref 36.0–46.0)
Hemoglobin: 7.1 g/dL — ABNORMAL LOW (ref 12.0–15.0)
Immature Granulocytes: 0 %
Lymphocytes Relative: 40 %
Lymphs Abs: 1.5 10*3/uL (ref 0.7–4.0)
MCH: 31.8 pg (ref 26.0–34.0)
MCHC: 32.3 g/dL (ref 30.0–36.0)
MCV: 98.7 fL (ref 80.0–100.0)
Monocytes Absolute: 0.4 10*3/uL (ref 0.1–1.0)
Monocytes Relative: 11 %
Neutro Abs: 1.7 10*3/uL (ref 1.7–7.7)
Neutrophils Relative %: 45 %
Platelets: 250 10*3/uL (ref 150–400)
RBC: 2.23 MIL/uL — ABNORMAL LOW (ref 3.87–5.11)
RDW: 16.6 % — ABNORMAL HIGH (ref 11.5–15.5)
WBC: 3.8 10*3/uL — ABNORMAL LOW (ref 4.0–10.5)
nRBC: 0 % (ref 0.0–0.2)

## 2021-09-18 LAB — PHOSPHORUS: Phosphorus: 5.3 mg/dL — ABNORMAL HIGH (ref 2.5–4.6)

## 2021-09-18 LAB — MAGNESIUM: Magnesium: 1.9 mg/dL (ref 1.7–2.4)

## 2021-09-18 NOTE — Progress Notes (Signed)
PROGRESS NOTE    Jamie Wilkins  XTG:[id number] DOB: 1961/04/20 DOA: 08/30/2021 PCP: [facility]   Brief Narrative:  60 y.o. BF PMHx paroxysmal atrial fibrillation not on anticoagulation due to history of GI bleed, alcoholic liver cirrhosis, chronic anemia.    Brought to the hospital at the recommendation of DSS after she was found sitting at home in her feces.  She complained of generalized weakness and poor oral intake.  She felt so weak that she was unable to get up from her chair.    She was admitted to the hospital for dehydration, hypotension, hypokalemia, hypomagnesemia, hypophosphatemia and hypocalcemia.  She was treated with IV fluids and her electrolytes were repleted.  Despite adequate IV resuscitation, blood pressure remained low.  Patient said that her blood pressure normally runs low.  She was started on midodrine for hypotension.  She also atrial fibrillation with RVR but rate control drugs were avoided because of hypotension.  Fortunately, her heart rate has improved.   She was seen by the psychiatrist for depression and suicidal ideation.  She was started on mirtazapine for depression.  She was initially placed on IVC but this has been revoked.   She was evaluated by PT and OT who recommended further rehabilitation at SNF."   Subjective: 10/29 afebrile for.  Lying comfortably in bed.  Resigned to the fact she will have to wait until Monday to find out if she is now signed up for Medicaid.    Assessment & Plan:  Covid vaccination;  Principal Problem:   Severe major depression, single episode, without psychotic features (HCC) Active Problems:   Hypocalcemia   Hypokalemia   Hypomagnesemia   Alcoholic hepatitis without ascites   AF (paroxysmal atrial fibrillation) (HCC)   Hypotension   Depression due to physical illness   Decubitus ulcer   Delusions (HCC)   Acute metabolic encephalopathy /Hospital Delirium  -10/22-25: Patient having  intermittent spells of confusion and disorientation at times consistent with delirium. VBG without hypercapnia and ammonia only minimally elevated, not enough to cause confusion. --Patient is afebrile without leukocytosis or any signs symptoms of infection.   --Monitor clinically, further evaluation if we see signs or symptoms of infection or neurologic changes. --Delirium precautions  Delusions - 10/26 patient delusional.  Therefore LACKS CAPACITY to make medical decisions.  NO HOME support.  Lives alone. -10/27 much more realistic about current health issues.  Understands needs 24-hour care, and has proposed realistic discharge plan involving staying with son and daughter-in-law in [place].  Depression with suicidal ideation:  stable without SI or HI.   Continue mirtazapine.   IVC has been revoked.  Psychiatrist has signed off.   Acute on chronic hypotension, dehydration:  -BP remains soft/low but stable. -Continue midodrine. -Maintain MAP > 65   Bilateral foot pain - Plantar Fascitis (less likely peripheral neuropathy).   Improved. Patient reports now tolerating ambulation well. Tylenol PRN.  Trial of topical lidocaine.   Recommend nighttime splints for this if persistent.   Continue PT.   Paroxysmal atrial fibrillation with RVR:  HR now controlled.  Rate control agents limited by hypotension.    Hypotension - 10/28 Albumin 50 g x 1 - 10/28 increase Midodrine 10 mg TID    EtOH -No active s/sx's of withdrawal. Counseled regarding cessation of alcohol use and its harmful health effects.  Continue thiamine and multivitamins.   Monitored on CIWA protocol.  ETOH liver cirrhosis:  -Compensated, no acute issues. Monitor.   Anemia of chronic  disease, folic acid deficiency:  Lab Results  Component Value Date   HGB 7.1 (L) 09/18/2021   HGB 8.2 (L) 09/17/2021   HGB 7.9 (L) 09/16/2021   HGB 7.6 (L) 09/15/2021   HGB 7.8 (L) 09/13/2021  -Transfuse for  hemoglobin<7 -Transfuse for HrH;  Hypokalemia, hypomagnesemia, hypocalcemia and hypophosphatemia:  Improved with replacement.  Monitor and replace as needed.  Vitamin D deficiency:  Vitamin D level is 20.3.   Continue vitamin D supplement.   Was taking vitamin D2 50,000 units once a week.    Hypocalcemia -Calcium= 10, will continue to monitor      Right buttock deep tissue injury  Pressure Injury 08/31/21 Buttocks Right Deep Tissue Pressure Injury - Purple or maroon localized area of discolored intact skin or blood-filled blister due to damage of underlying soft tissue from pressure and/or shear. (Active)  08/31/21 1800  Location: Buttocks  Location Orientation: Right  Staging: Deep Tissue Pressure Injury - Purple or maroon localized area of discolored intact skin or blood-filled blister due to damage of underlying soft tissue from pressure and/or shear.  Wound Description (Comments):   Present on Admission: Yes  -Continue local wound care.    DVT prophylaxis: Lovenox  code Status: Full Family Communication: 10/27 son at bedside for discussion of plan of care all questions answered Status is: Inpatient    Dispo: The patient is from: Home              Anticipated d/c is to: SNF              Anticipated d/c date is: > 3 days              Patient currently is not medically stable to d/c.      Consultants:  Psychiatry  Procedures/Significant Events:     I have personally reviewed and interpreted all radiology studies and my findings are as above.  VENTILATOR SETTINGS:    Cultures   Antimicrobials:    Devices    LINES / TUBES:      Continuous Infusions:    Objective: Vitals:   09/18/21 0017 09/18/21 0825 09/18/21 1100 09/18/21 1707  BP: 110/76 131/87 131/84 121/76  Pulse: 91 71 73 79  Resp: 14 17 16 17   Temp: 98.4 F (36.9 C) 98.1 F (36.7 C) 98.1 F (36.7 C) 98.2 F (36.8 C)  TempSrc:      SpO2: 98% 100% 100% 99%  Weight:       Height:        Intake/Output Summary (Last 24 hours) at 09/18/2021 1947 Last data filed at 09/18/2021 1842 Gross per 24 hour  Intake 480 ml  Output 350 ml  Net 130 ml    Filed Weights   08/30/21 1936  Weight: 62.1 kg    Examination:  General: A/O x4, No acute respiratory distress Eyes: negative scleral hemorrhage, negative anisocoria, negative icterus ENT: Negative Runny nose, negative gingival bleeding, Neck:  Negative scars, masses, torticollis, lymphadenopathy, JVD Lungs: Clear to auscultation bilaterally without wheezes or crackles Cardiovascular: Regular rate and rhythm without murmur gallop or rub normal S1 and S2 Abdomen: negative abdominal pain, nondistended, positive soft, bowel sounds, no rebound, no ascites, no appreciable mass Extremities: No significant cyanosis, clubbing, or edema bilateral lower extremities Skin: Negative rashes, lesions, ulcers Psychiatric:  Negative depression, negative anxiety, negative fatigue, negative mania, POSITIVE delusions, POSITIVE extremely poor insight Central nervous system:  Cranial nerves II through XII intact, tongue/uvula midline, all extremities muscle strength 5/5 (when  sitting).  Patient unable to rise from seated position without moderate assist., sensation intact throughout, negative dysarthria, negative expressive aphasia, negative receptive aphasia.  .     Data Reviewed: Care during the described time interval was provided by me .  I have reviewed this patient's available data, including medical history, events of note, physical examination, and all test results as part of my evaluation.   CBC: Recent Labs  Lab 09/13/21 0436 09/15/21 0756 09/16/21 0611 09/17/21 0612 09/18/21 0453  WBC 3.8* 4.2 4.1 3.9* 3.8*  NEUTROABS  --  1.9 2.0 1.8 1.7  HGB 7.8* 7.6* 7.9* 8.2* 7.1*  HCT 24.6* 23.6* 24.2* 26.1* 22.0*  MCV 100.8* 102.2* 100.0 100.4* 98.7  PLT 235 238 261 275 250    Basic Metabolic Panel: Recent Labs  Lab  09/15/21 0756 09/16/21 0611 09/17/21 0612 09/18/21 0453  NA 139 141 138 138  K 4.7 4.7 4.4 4.7  CL 107 107 106 108  CO2 27 27 28 27   GLUCOSE 78 71 78 71  BUN 8 9 10 8   CREATININE 0.93 1.09* 1.18* 0.96  CALCIUM 8.0* 8.1* 7.9* 8.3*  MG 2.0 2.0 2.1 1.9  PHOS 5.8* 5.8* 5.2* 5.3*    GFR: Estimated Creatinine Clearance: 57.6 mL/min (by C-G formula based on SCr of 0.96 mg/dL). Liver Function Tests: Recent Labs  Lab 09/15/21 0756 09/16/21 0611 09/17/21 0612 09/18/21 0453  AST 46* 42* 41 36  ALT 21 19 19 18   ALKPHOS 137* 130* 120 90  BILITOT 0.6 0.6 0.6 0.7  PROT 5.0* 5.2* 5.0* 5.1*  ALBUMIN 1.5* 1.6* 1.6* 2.3*    No results for input(s): LIPASE, AMYLASE in the last 168 hours. Recent Labs  Lab 09/12/21 0802  AMMONIA 38*    Coagulation Profile: No results for input(s): INR, PROTIME in the last 168 hours. Cardiac Enzymes: No results for input(s): CKTOTAL, CKMB, CKMBINDEX, TROPONINI in the last 168 hours. BNP (last 3 results) No results for input(s): PROBNP in the last 8760 hours. HbA1C: No results for input(s): HGBA1C in the last 72 hours. CBG: No results for input(s): GLUCAP in the last 168 hours. Lipid Profile: No results for input(s): CHOL, HDL, LDLCALC, TRIG, CHOLHDL, LDLDIRECT in the last 72 hours. Thyroid Function Tests: No results for input(s): TSH, T4TOTAL, FREET4, T3FREE, THYROIDAB in the last 72 hours. Anemia Panel: Recent Labs    09/16/21 0611  VITAMINB12 515  FOLATE 17.0  FERRITIN 1,307*  TIBC 129*  IRON 37  RETICCTPCT 3.6*    Urine analysis:    Component Value Date/Time   COLORURINE YELLOW (A) 09/04/2021 0507   APPEARANCEUR HAZY (A) 09/04/2021 0507   LABSPEC 1.004 (L) 09/04/2021 0507   PHURINE 6.0 09/04/2021 0507   GLUCOSEU NEGATIVE 09/04/2021 0507   HGBUR SMALL (A) 09/04/2021 0507   BILIRUBINUR NEGATIVE 09/04/2021 0507   KETONESUR NEGATIVE 09/04/2021 0507   PROTEINUR NEGATIVE 09/04/2021 0507   NITRITE NEGATIVE 09/04/2021 0507    LEUKOCYTESUR LARGE (A) 09/04/2021 0507   Sepsis Labs: @LABRCNTIP (procalcitonin:4,lacticidven:4)  )No results found for this or any previous visit (from the past 240 hour(s)).       Radiology Studies: No results found.      Scheduled Meds:  enoxaparin (LOVENOX) injection  40 mg Subcutaneous Q24H   feeding supplement  237 mL Oral TID BM   folic acid  1 mg Oral Daily   midodrine  10 mg Oral TID WC   mirtazapine  30 mg Oral QHS   multivitamin with minerals  1  tablet Oral Daily   nicotine  14 mg Transdermal Daily   polyethylene glycol  17 g Oral Daily   thiamine  100 mg Oral Daily   Or   thiamine  100 mg Intravenous Daily   Vitamin D (Ergocalciferol)  50,000 Units Oral Q7 days   Continuous Infusions:    LOS: 18 days   The patient is critically ill with multiple organ systems failure and requires high complexity decision making for assessment and support, frequent evaluation and titration of therapies, application of advanced monitoring technologies and extensive interpretation of multiple databases. Critical Care Time devoted to patient care services described in this note  Time spent: 40 minutes     Muaad Boehning, Roselind Messier, MD [facility]   If 7PM-7AM, please contact night-coverage 09/18/2021, 7:47 PM

## 2021-09-18 NOTE — TOC Progression Note (Signed)
Transition of Care [facility]) - Progression Note    Patient Details  Name: Jamie Wilkins MRN: [id number] Date of Birth: 08/03/1961  Transition of Care [facility]) CM/SW Contact  Hetty Ely, RN Phone Number: 09/18/2021, 8:55 AM  Clinical Narrative:  Late Entry documentation, on 09/17/21 11:05am spoke with patient who wants to be discharged home, says her neighbors will be available to assist her if needed. Patient called her son, Jamie Wilkins who lives in [place] for me to discuss discharge plans. Spoke with son who says his Bachelor party is tomorrow, Sat. 09/18/21 and he has not had the time to secure 24-7 coverage for patient and asked if I can work on Rehab until he is able to secure services for patient. Patient heard the request and took the phone to explain to Son that she can not go into a Rehab. Due to staffing. I disagreed, however patient insisted on Son coming to get her before the Bachelor party so she can stay with the fiance. Son asked for my number to call me in 2 hours for further discussion, phone number given. Patient angry continues to discuss son's lack of knowledge about rehab. Facilities and her discharging home until he get married and work out coverage for her to move to [place]. I did inform patient that the discharge will still need to be a safe discharge and that the Attending and DSS will need to approve not me. 1:30pm Jamie Wilkins calls and says he discussed Mother's situation with future wife and no true decision was made and he has no coverage to assist with the care needed for patient. I did send Jamie Wilkins a list of resources for [place] and advise him to call use. Jamie Wilkins appreciative, however feels that with the wedding being in two weeks he may not be able to do so and recommends that we work on [employer]. Placement. I did inform him that I will discuss with Attending.  09/18/21 8:30am Discussed with Attending what the Son said about not having safe discharge for patient and may not  be able to do so due to wedding plans and ask that we look into Rehab. Placement. Attending informed that resources was given to Son. Attending says that patient informed him that Medicaid was approved and she may be getting a check. TOC will confirm Medicaid approval and discuss placement with DSS.    Expected Discharge Plan: Home w Home Health Services Barriers to Discharge: Continued Medical Work up  Expected Discharge Plan and Services Expected Discharge Plan: Home w Home Health Services       Living arrangements for the past 2 months: Single Family Home                                       Social Determinants of Health (SDOH) Interventions    Readmission Risk Interventions No flowsheet data found.

## 2021-09-19 LAB — CBC WITH DIFFERENTIAL/PLATELET
Abs Immature Granulocytes: 0.01 10*3/uL (ref 0.00–0.07)
Basophils Absolute: 0 10*3/uL (ref 0.0–0.1)
Basophils Relative: 1 %
Eosinophils Absolute: 0.1 10*3/uL (ref 0.0–0.5)
Eosinophils Relative: 3 %
HCT: 23.3 % — ABNORMAL LOW (ref 36.0–46.0)
Hemoglobin: 7.7 g/dL — ABNORMAL LOW (ref 12.0–15.0)
Immature Granulocytes: 0 %
Lymphocytes Relative: 40 %
Lymphs Abs: 1.6 10*3/uL (ref 0.7–4.0)
MCH: 33.2 pg (ref 26.0–34.0)
MCHC: 33 g/dL (ref 30.0–36.0)
MCV: 100.4 fL — ABNORMAL HIGH (ref 80.0–100.0)
Monocytes Absolute: 0.5 10*3/uL (ref 0.1–1.0)
Monocytes Relative: 12 %
Neutro Abs: 1.7 10*3/uL (ref 1.7–7.7)
Neutrophils Relative %: 44 %
Platelets: 248 10*3/uL (ref 150–400)
RBC: 2.32 MIL/uL — ABNORMAL LOW (ref 3.87–5.11)
RDW: 16.2 % — ABNORMAL HIGH (ref 11.5–15.5)
WBC: 3.9 10*3/uL — ABNORMAL LOW (ref 4.0–10.5)
nRBC: 0 % (ref 0.0–0.2)

## 2021-09-19 LAB — COMPREHENSIVE METABOLIC PANEL
ALT: 17 U/L (ref 0–44)
AST: 34 U/L (ref 15–41)
Albumin: 2.1 g/dL — ABNORMAL LOW (ref 3.5–5.0)
Alkaline Phosphatase: 98 U/L (ref 38–126)
Anion gap: 7 (ref 5–15)
BUN: 7 mg/dL — ABNORMAL LOW (ref 8–23)
CO2: 26 mmol/L (ref 22–32)
Calcium: 8.2 mg/dL — ABNORMAL LOW (ref 8.9–10.3)
Chloride: 107 mmol/L (ref 98–111)
Creatinine, Ser: 1.06 mg/dL — ABNORMAL HIGH (ref 0.44–1.00)
GFR, Estimated: 60 mL/min — ABNORMAL LOW (ref >60)
Glucose, Bld: 73 mg/dL (ref 70–99)
Potassium: 4.4 mmol/L (ref 3.5–5.1)
Sodium: 140 mmol/L (ref 135–145)
Total Bilirubin: 0.5 mg/dL (ref 0.3–1.2)
Total Protein: 5.2 g/dL — ABNORMAL LOW (ref 6.5–8.1)

## 2021-09-19 LAB — MAGNESIUM: Magnesium: 2.1 mg/dL (ref 1.7–2.4)

## 2021-09-19 LAB — PHOSPHORUS: Phosphorus: 5.6 mg/dL — ABNORMAL HIGH (ref 2.5–4.6)

## 2021-09-19 MED ORDER — ALBUMIN HUMAN 25 % IV SOLN
50.0000 g | Freq: Once | INTRAVENOUS | Status: AC
  Administered 2021-09-19: 50 g via INTRAVENOUS
  Filled 2021-09-19: qty 200

## 2021-09-19 NOTE — Progress Notes (Signed)
PROGRESS NOTE    Jamie Wilkins  HER:[id number] DOB: 1961/08/03 DOA: 08/30/2021 PCP: [facility]   Brief Narrative:  60 y.o. BF PMHx paroxysmal atrial fibrillation not on anticoagulation due to history of GI bleed, alcoholic liver cirrhosis, chronic anemia.    Brought to the hospital at the recommendation of DSS after she was found sitting at home in her feces.  She complained of generalized weakness and poor oral intake.  She felt so weak that she was unable to get up from her chair.    She was admitted to the hospital for dehydration, hypotension, hypokalemia, hypomagnesemia, hypophosphatemia and hypocalcemia.  She was treated with IV fluids and her electrolytes were repleted.  Despite adequate IV resuscitation, blood pressure remained low.  Patient said that her blood pressure normally runs low.  She was started on midodrine for hypotension.  She also atrial fibrillation with RVR but rate control drugs were avoided because of hypotension.  Fortunately, her heart rate has improved.   She was seen by the psychiatrist for depression and suicidal ideation.  She was started on mirtazapine for depression.  She was initially placed on IVC but this has been revoked.   She was evaluated by PT and OT who recommended further rehabilitation at SNF."   Subjective: 10/30 A/O x4 patient laying comfortably in bed watching football game.  States helps tomorrow NCM will find out that her Medicaid card has been activated.   Assessment & Plan:  Covid vaccination;  Principal Problem:   Severe major depression, single episode, without psychotic features (HCC) Active Problems:   Hypocalcemia   Hypokalemia   Hypomagnesemia   Alcoholic hepatitis without ascites   AF (paroxysmal atrial fibrillation) (HCC)   Hypotension   Depression due to physical illness   Decubitus ulcer   Delusions (HCC)   Acute metabolic encephalopathy /Hospital Delirium  -10/22-25: Patient having  intermittent spells of confusion and disorientation at times consistent with delirium. VBG without hypercapnia and ammonia only minimally elevated, not enough to cause confusion. --Patient is afebrile without leukocytosis or any signs symptoms of infection.   --Monitor clinically, further evaluation if we see signs or symptoms of infection or neurologic changes. --Delirium precautions  Delusions - 10/26 patient delusional.  Therefore LACKS CAPACITY to make medical decisions.  NO HOME support.  Lives alone. -10/27 much more realistic about current health issues.  Understands needs 24-hour care, and has proposed realistic discharge plan involving staying with son and daughter-in-law in [place]. -10/29 resolved  Depression with suicidal ideation:  stable without SI or HI.   Continue mirtazapine.   IVC has been revoked.  Psychiatrist has signed off.   Acute on chronic hypotension, dehydration:  -BP remains soft/low but stable. -Continue midodrine. -Maintain MAP > 65   Bilateral foot pain - Plantar Fascitis (less likely peripheral neuropathy).   Improved. Patient reports now tolerating ambulation well. Tylenol PRN.  Trial of topical lidocaine.   Recommend nighttime splints for this if persistent.   Continue PT.   Paroxysmal atrial fibrillation with RVR:  HR now controlled.  Rate control agents limited by hypotension.    Hypotension - 10/28 Albumin 50 g x 1 - 10/28 increase Midodrine 10 mg TID -10/30 albumin 50 g x 1    EtOH -No active s/sx's of withdrawal. Counseled regarding cessation of alcohol use and its harmful health effects.  Continue thiamine and multivitamins.   Monitored on CIWA protocol.  ETOH liver cirrhosis:  -Compensated, no acute issues. Monitor.   Anemia  of chronic disease, folic acid deficiency:  Lab Results  Component Value Date   HGB 7.7 (L) 09/19/2021   HGB 7.1 (L) 09/18/2021   HGB 8.2 (L) 09/17/2021   HGB 7.9 (L) 09/16/2021   HGB 7.6 (L)  09/15/2021  -Transfuse for hemoglobin<7  Hypokalemia, hypomagnesemia, hypocalcemia and hypophosphatemia:  -Resolved .  Vitamin D deficiency:  Vitamin D level is 20.3.   Continue vitamin D supplement.   Was taking vitamin D2 50,000 units once a week.    Hypocalcemia -Calcium= 10, will continue to monitor      Right buttock deep tissue injury  Pressure Injury 08/31/21 Buttocks Right Deep Tissue Pressure Injury - Purple or maroon localized area of discolored intact skin or blood-filled blister due to damage of underlying soft tissue from pressure and/or shear. (Active)  08/31/21 1800  Location: Buttocks  Location Orientation: Right  Staging: Deep Tissue Pressure Injury - Purple or maroon localized area of discolored intact skin or blood-filled blister due to damage of underlying soft tissue from pressure and/or shear.  Wound Description (Comments):   Present on Admission: Yes  -Continue local wound care.    DVT prophylaxis: Lovenox  code Status: Full Family Communication: 10/27 son at bedside for discussion of plan of care all questions answered Status is: Inpatient    Dispo: The patient is from: Home              Anticipated d/c is to: SNF              Anticipated d/c date is: > 3 days              Patient currently is not medically stable to d/c.      Consultants:  Psychiatry  Procedures/Significant Events:     I have personally reviewed and interpreted all radiology studies and my findings are as above.  VENTILATOR SETTINGS:    Cultures   Antimicrobials:    Devices    LINES / TUBES:      Continuous Infusions:    Objective: Vitals:   09/19/21 0619 09/19/21 0911 09/19/21 1154 09/19/21 1540  BP: 112/76 122/83 113/79 (!) 81/70  Pulse: 85 76 77 72  Resp: 18 14 16 18   Temp: 98.6 F (37 C) 98 F (36.7 C) 98 F (36.7 C) 99 F (37.2 C)  TempSrc:    Oral  SpO2: 96% 96% 100% 96%  Weight:      Height:        Intake/Output Summary (Last  24 hours) at 09/19/2021 1741 Last data filed at 09/19/2021 1700 Gross per 24 hour  Intake 477 ml  Output 1500 ml  Net -1023 ml    Filed Weights   08/30/21 1936  Weight: 62.1 kg    Examination:  General: A/O x4, No acute respiratory distress Eyes: negative scleral hemorrhage, negative anisocoria, negative icterus ENT: Negative Runny nose, negative gingival bleeding, Neck:  Negative scars, masses, torticollis, lymphadenopathy, JVD Lungs: Clear to auscultation bilaterally without wheezes or crackles Cardiovascular: Regular rate and rhythm without murmur gallop or rub normal S1 and S2 Abdomen: negative abdominal pain, nondistended, positive soft, bowel sounds, no rebound, no ascites, no appreciable mass Extremities: No significant cyanosis, clubbing, or edema bilateral lower extremities Skin: Negative rashes, lesions, ulcers Psychiatric:  Negative depression, negative anxiety, negative fatigue, negative mania, POSITIVE delusions, POSITIVE extremely poor insight Central nervous system:  Cranial nerves II through XII intact, tongue/uvula midline, all extremities muscle strength 5/5 (when sitting).  Patient unable to rise from  seated position without moderate assist., sensation intact throughout, negative dysarthria, negative expressive aphasia, negative receptive aphasia.  .     Data Reviewed: Care during the described time interval was provided by me .  I have reviewed this patient's available data, including medical history, events of note, physical examination, and all test results as part of my evaluation.   CBC: Recent Labs  Lab 09/15/21 0756 09/16/21 0611 09/17/21 0612 09/18/21 0453 09/19/21 0531  WBC 4.2 4.1 3.9* 3.8* 3.9*  NEUTROABS 1.9 2.0 1.8 1.7 1.7  HGB 7.6* 7.9* 8.2* 7.1* 7.7*  HCT 23.6* 24.2* 26.1* 22.0* 23.3*  MCV 102.2* 100.0 100.4* 98.7 100.4*  PLT 238 261 275 250 248    Basic Metabolic Panel: Recent Labs  Lab 09/15/21 0756 09/16/21 0611 09/17/21 0612  09/18/21 0453 09/19/21 0531  NA 139 141 138 138 140  K 4.7 4.7 4.4 4.7 4.4  CL 107 107 106 108 107  CO2 27 27 28 27 26   GLUCOSE 78 71 78 71 73  BUN 8 9 10 8  7*  CREATININE 0.93 1.09* 1.18* 0.96 1.06*  CALCIUM 8.0* 8.1* 7.9* 8.3* 8.2*  MG 2.0 2.0 2.1 1.9 2.1  PHOS 5.8* 5.8* 5.2* 5.3* 5.6*    GFR: Estimated Creatinine Clearance: 52.2 mL/min (A) (by C-G formula based on SCr of 1.06 mg/dL (H)). Liver Function Tests: Recent Labs  Lab 09/15/21 0756 09/16/21 0611 09/17/21 0612 09/18/21 0453 09/19/21 0531  AST 46* 42* 41 36 34  ALT 21 19 19 18 17   ALKPHOS 137* 130* 120 90 98  BILITOT 0.6 0.6 0.6 0.7 0.5  PROT 5.0* 5.2* 5.0* 5.1* 5.2*  ALBUMIN 1.5* 1.6* 1.6* 2.3* 2.1*    No results for input(s): LIPASE, AMYLASE in the last 168 hours. No results for input(s): AMMONIA in the last 168 hours.  Coagulation Profile: No results for input(s): INR, PROTIME in the last 168 hours. Cardiac Enzymes: No results for input(s): CKTOTAL, CKMB, CKMBINDEX, TROPONINI in the last 168 hours. BNP (last 3 results) No results for input(s): PROBNP in the last 8760 hours. HbA1C: No results for input(s): HGBA1C in the last 72 hours. CBG: No results for input(s): GLUCAP in the last 168 hours. Lipid Profile: No results for input(s): CHOL, HDL, LDLCALC, TRIG, CHOLHDL, LDLDIRECT in the last 72 hours. Thyroid Function Tests: No results for input(s): TSH, T4TOTAL, FREET4, T3FREE, THYROIDAB in the last 72 hours. Anemia Panel: No results for input(s): VITAMINB12, FOLATE, FERRITIN, TIBC, IRON, RETICCTPCT in the last 72 hours.  Urine analysis:    Component Value Date/Time   COLORURINE YELLOW (A) 09/04/2021 0507   APPEARANCEUR HAZY (A) 09/04/2021 0507   LABSPEC 1.004 (L) 09/04/2021 0507   PHURINE 6.0 09/04/2021 0507   GLUCOSEU NEGATIVE 09/04/2021 0507   HGBUR SMALL (A) 09/04/2021 0507   BILIRUBINUR NEGATIVE 09/04/2021 0507   KETONESUR NEGATIVE 09/04/2021 0507   PROTEINUR NEGATIVE 09/04/2021 0507    NITRITE NEGATIVE 09/04/2021 0507   LEUKOCYTESUR LARGE (A) 09/04/2021 0507   Sepsis Labs: @LABRCNTIP (procalcitonin:4,lacticidven:4)  )No results found for this or any previous visit (from the past 240 hour(s)).       Radiology Studies: No results found.      Scheduled Meds:  enoxaparin (LOVENOX) injection  40 mg Subcutaneous Q24H   feeding supplement  237 mL Oral TID BM   folic acid  1 mg Oral Daily   midodrine  10 mg Oral TID WC   mirtazapine  30 mg Oral QHS   multivitamin with minerals  1  tablet Oral Daily   nicotine  14 mg Transdermal Daily   polyethylene glycol  17 g Oral Daily   thiamine  100 mg Oral Daily   Or   thiamine  100 mg Intravenous Daily   Vitamin D (Ergocalciferol)  50,000 Units Oral Q7 days   Continuous Infusions:    LOS: 19 days   The patient is critically ill with multiple organ systems failure and requires high complexity decision making for assessment and support, frequent evaluation and titration of therapies, application of advanced monitoring technologies and extensive interpretation of multiple databases. Critical Care Time devoted to patient care services described in this note  Time spent: 40 minutes     Jyla Hopf, Roselind Messier, MD [facility]   If 7PM-7AM, please contact night-coverage 09/19/2021, 5:41 PM

## 2021-09-20 LAB — CBC WITH DIFFERENTIAL/PLATELET
Abs Immature Granulocytes: 0.01 10*3/uL (ref 0.00–0.07)
Basophils Absolute: 0 10*3/uL (ref 0.0–0.1)
Basophils Relative: 1 %
Eosinophils Absolute: 0.1 10*3/uL (ref 0.0–0.5)
Eosinophils Relative: 3 %
HCT: 22.7 % — ABNORMAL LOW (ref 36.0–46.0)
Hemoglobin: 7.3 g/dL — ABNORMAL LOW (ref 12.0–15.0)
Immature Granulocytes: 0 %
Lymphocytes Relative: 41 %
Lymphs Abs: 1.6 10*3/uL (ref 0.7–4.0)
MCH: 32.9 pg (ref 26.0–34.0)
MCHC: 32.2 g/dL (ref 30.0–36.0)
MCV: 102.3 fL — ABNORMAL HIGH (ref 80.0–100.0)
Monocytes Absolute: 0.5 10*3/uL (ref 0.1–1.0)
Monocytes Relative: 12 %
Neutro Abs: 1.7 10*3/uL (ref 1.7–7.7)
Neutrophils Relative %: 43 %
Platelets: 237 10*3/uL (ref 150–400)
RBC: 2.22 MIL/uL — ABNORMAL LOW (ref 3.87–5.11)
RDW: 16.1 % — ABNORMAL HIGH (ref 11.5–15.5)
Smear Review: NORMAL
WBC Morphology: REACTIVE
WBC: 3.8 10*3/uL — ABNORMAL LOW (ref 4.0–10.5)
nRBC: 0 % (ref 0.0–0.2)

## 2021-09-20 LAB — COMPREHENSIVE METABOLIC PANEL
ALT: 14 U/L (ref 0–44)
AST: 30 U/L (ref 15–41)
Albumin: 2.9 g/dL — ABNORMAL LOW (ref 3.5–5.0)
Alkaline Phosphatase: 77 U/L (ref 38–126)
Anion gap: 6 (ref 5–15)
BUN: 7 mg/dL — ABNORMAL LOW (ref 8–23)
CO2: 27 mmol/L (ref 22–32)
Calcium: 8.4 mg/dL — ABNORMAL LOW (ref 8.9–10.3)
Chloride: 109 mmol/L (ref 98–111)
Creatinine, Ser: 0.97 mg/dL (ref 0.44–1.00)
GFR, Estimated: >60 mL/min (ref >60)
Glucose, Bld: 75 mg/dL (ref 70–99)
Potassium: 4.3 mmol/L (ref 3.5–5.1)
Sodium: 142 mmol/L (ref 135–145)
Total Bilirubin: 0.5 mg/dL (ref 0.3–1.2)
Total Protein: 5.5 g/dL — ABNORMAL LOW (ref 6.5–8.1)

## 2021-09-20 LAB — MAGNESIUM: Magnesium: 1.9 mg/dL (ref 1.7–2.4)

## 2021-09-20 LAB — PHOSPHORUS: Phosphorus: 5.4 mg/dL — ABNORMAL HIGH (ref 2.5–4.6)

## 2021-09-20 NOTE — Progress Notes (Signed)
Patient up with OT bathing herself.  Sacral patch removed and wound cleaned and patted dry.  New xeroform and sacral patch applied.  Patient tolerated dressing change without difficulty.

## 2021-09-20 NOTE — Progress Notes (Signed)
Occupational Therapy Treatment Patient Details Name: Jamie Wilkins MRN: [id number] DOB: Apr 23, 1961 Today's Date: 09/20/2021   History of present illness 60 y.o. female who presents to ED following a DSS welfare check; pt and her neighbor called for Meals on Wheels and EMS ultimately brought pt to hospital. Medical history significant for paroxysmal atrial fibrillation not on anticoagulation due to history of GI bleed, alcoholic cirrhosis, anemia.   OT comments  Jamie Wilkins was seen for OT treatment on this date. Upon arrival to room pt reclined in chair, agreeable to tx. Pt requires MIN A for toilet t/f, SBA perihygiene in standing. MIN A for bathing seated on BSC - assist for buttocks and feet. SETUP + SUPERVISION don/doff gown seated on BSC. Pt making good progress toward goals, plan to re-assess next session. Pt continues to benefit from skilled OT services to maximize return to PLOF and minimize risk of future falls, injury, caregiver burden, and readmission. Will continue to follow POC. Discharge recommendation remains appropriate.     Recommendations for follow up therapy are one component of a multi-disciplinary discharge planning process, led by the attending physician.  Recommendations may be updated based on patient status, additional functional criteria and insurance authorization.    Follow Up Recommendations  Skilled nursing-short term rehab (<3 hours/day)    Assistance Recommended at Discharge Frequent or constant Supervision/Assistance  Equipment Recommendations  BSC    Recommendations for Other Services      Precautions / Restrictions Precautions Precautions: Fall Precaution Comments: monitor HR Restrictions Weight Bearing Restrictions: No       Mobility Bed Mobility Overal bed mobility: Modified Independent                  Transfers Overall transfer level: Needs assistance Equipment used: Rolling walker (2 wheels) Transfers: Sit to/from Stand Sit to  Stand: Min assist                 Balance Overall balance assessment: Needs assistance Sitting-balance support: No upper extremity supported;Feet unsupported Sitting balance-Leahy Scale: Good   Postural control: Right lateral lean Standing balance support: No upper extremity supported Standing balance-Leahy Scale: Good                             ADL either performed or assessed with clinical judgement   ADL Overall ADL's : Needs assistance/impaired                                       General ADL Comments: MIN A for toilet t/f, SBA perihygiene in standing. MIN A for bathing seated on BSC - assist for buttocks and feet. SETUP + SUPERVISION don/doff gown seated on BSC.      Cognition Arousal/Alertness: Awake/alert Behavior During Therapy: WFL for tasks assessed/performed Overall Cognitive Status: Within Functional Limits for tasks assessed Area of Impairment: Problem solving                             Problem Solving: Slow processing            Exercises Exercises: Other exercises Other Exercises Other Exercises: Pt educated re: OT role, DME recs, d/c recs, falls prevention, HEP Other Exercises: LBD, bathing, sit<>stand, sit>sup, sitting/standing balance/tolerance, toileting           Pertinent Vitals/ Pain  Pain Assessment: No/denies pain   Frequency  Min 2X/week        Progress Toward Goals  OT Goals(current goals can now be found in the care plan section)  Progress towards OT goals: Progressing toward goals  Acute Rehab OT Goals Patient Stated Goal: to go home today OT Goal Formulation: With patient Time For Goal Achievement: 09/29/21 Potential to Achieve Goals: Fair ADL Goals Pt Will Perform Grooming: with modified independence;standing Pt Will Perform Lower Body Dressing: with min assist;sit to/from stand Pt Will Transfer to Toilet: with modified independence;ambulating;bedside commode Pt Will  Perform Toileting - Clothing Manipulation and hygiene: with modified independence;sitting/lateral leans  Plan Discharge plan remains appropriate;Frequency remains appropriate       AM-PAC OT "6 Clicks" Daily Activity     Outcome Measure   Help from another person eating meals?: None Help from another person taking care of personal grooming?: A Little Help from another person toileting, which includes using toliet, bedpan, or urinal?: A Lot Help from another person bathing (including washing, rinsing, drying)?: A Lot Help from another person to put on and taking off regular upper body clothing?: A Little Help from another person to put on and taking off regular lower body clothing?: A Little 6 Click Score: 17    End of Session Equipment Utilized During Treatment: Rolling walker (2 wheels)  OT Visit Diagnosis: Unsteadiness on feet (R26.81);Muscle weakness (generalized) (M62.81)   Activity Tolerance Patient tolerated treatment well   Patient Left in bed;with call bell/phone within reach;with bed alarm set   Nurse Communication Mobility status        Time: 9147-8295 OT Time Calculation (min): 30 min  Charges: OT General Charges $OT Visit: 1 Visit OT Treatments $Self Care/Home Management : 23-37 mins  Kathie Dike, M.S. OTR/L  09/20/21, 4:09 PM  ascom [phone]

## 2021-09-20 NOTE — Progress Notes (Signed)
PROGRESS NOTE    Jamie Wilkins  PTW:[id number] DOB: [birth year] DOA: 08/30/2021 PCP: [facility]   Brief Narrative:  60 y.o. BF PMHx paroxysmal atrial fibrillation not on anticoagulation due to history of GI bleed, alcoholic liver cirrhosis, chronic anemia.    Brought to the hospital at the recommendation of DSS after she was found sitting at home in her feces.  She complained of generalized weakness and poor oral intake.  She felt so weak that she was unable to get up from her chair.    She was admitted to the hospital for dehydration, hypotension, hypokalemia, hypomagnesemia, hypophosphatemia and hypocalcemia.  She was treated with IV fluids and her electrolytes were repleted.  Despite adequate IV resuscitation, blood pressure remained low.  Patient said that her blood pressure normally runs low.  She was started on midodrine for hypotension.  She also atrial fibrillation with RVR but rate control drugs were avoided because of hypotension.  Fortunately, her heart rate has improved.   She was seen by the psychiatrist for depression and suicidal ideation.  She was started on mirtazapine for depression.  She was initially placed on IVC but this has been revoked.   She was evaluated by PT and OT who recommended further rehabilitation at SNF."   Subjective: 10/31 A/O x4,.  Laying in bed comfortably.  Anxious about his discharge.     Assessment & Plan:  Covid vaccination;  Principal Problem:   Severe major depression, single episode, without psychotic features (HCC) Active Problems:   Hypocalcemia   Hypokalemia   Hypomagnesemia   Alcoholic hepatitis without ascites   AF (paroxysmal atrial fibrillation) (HCC)   Hypotension   Depression due to physical illness   Decubitus ulcer   Delusions (HCC)   Acute metabolic encephalopathy /Hospital Delirium  -10/22-25: Patient having intermittent spells of confusion and disorientation at times consistent with  delirium. VBG without hypercapnia and ammonia only minimally elevated, not enough to cause confusion. --Patient is afebrile without leukocytosis or any signs symptoms of infection.   --Monitor clinically, further evaluation if we see signs or symptoms of infection or neurologic changes. --Delirium precautions  Delusions - 10/26 patient delusional.  Therefore LACKS CAPACITY to make medical decisions.  NO HOME support.  Lives alone. -10/27 much more realistic about current health issues.  Understands needs 24-hour care, and has proposed realistic discharge plan involving staying with son and daughter-in-law in [place]. -10/29 resolved  Depression with suicidal ideation:  stable without SI or HI.   Continue mirtazapine.   IVC has been revoked.  Psychiatrist has signed off.   Acute on chronic hypotension, dehydration:  -BP remains soft/low but stable. -Continue midodrine. -Maintain MAP > 65   Bilateral foot pain - Plantar Fascitis (less likely peripheral neuropathy).   Improved. Patient reports now tolerating ambulation well. Tylenol PRN.  Trial of topical lidocaine.   Recommend nighttime splints for this if persistent.   Continue PT.   Paroxysmal atrial fibrillation with RVR:  HR now controlled.  Rate control agents limited by hypotension.    Hypotension - 10/28 Albumin 50 g x 1 - 10/28 increase Midodrine 10 mg TID -10/30 albumin 50 g x 1    EtOH -No active s/sx's of withdrawal. Counseled regarding cessation of alcohol use and its harmful health effects.  Continue thiamine and multivitamins.   Monitored on CIWA protocol.  ETOH liver cirrhosis:  -Compensated, no acute issues. Monitor.   Anemia of chronic disease, folic acid deficiency:  Lab Results  Component  Value Date   HGB 7.3 (L) 09/20/2021   HGB 7.7 (L) 09/19/2021   HGB 7.1 (L) 09/18/2021   HGB 8.2 (L) 09/17/2021   HGB 7.9 (L) 09/16/2021  -Transfuse for hemoglobin<7  Hypokalemia, hypomagnesemia,  hypocalcemia and hypophosphatemia:  -Resolved .  Vitamin D deficiency:  Vitamin D level is 20.3.   Continue vitamin D supplement.   Was taking vitamin D2 50,000 units once a week.    Hypocalcemia -Calcium= 10, will continue to monitor      Right buttock deep tissue injury  Pressure Injury 08/31/21 Buttocks Right Deep Tissue Pressure Injury - Purple or maroon localized area of discolored intact skin or blood-filled blister due to damage of underlying soft tissue from pressure and/or shear. (Active)  08/31/21 1800  Location: Buttocks  Location Orientation: Right  Staging: Deep Tissue Pressure Injury - Purple or maroon localized area of discolored intact skin or blood-filled blister due to damage of underlying soft tissue from pressure and/or shear.  Wound Description (Comments):   Present on Admission: Yes  -Continue local wound care.    DVT prophylaxis: Lovenox  code Status: Full Family Communication: 10/27 son at bedside for discussion of plan of care all questions answered Status is: Inpatient    Dispo: The patient is from: Home              Anticipated d/c is to: SNF              Anticipated d/c date is: > 3 days              Patient currently is not medically stable to d/c.      Consultants:  Psychiatry  Procedures/Significant Events:     I have personally reviewed and interpreted all radiology studies and my findings are as above.  VENTILATOR SETTINGS:    Cultures   Antimicrobials:    Devices    LINES / TUBES:      Continuous Infusions:    Objective: Vitals:   09/19/21 2219 09/20/21 0612 09/20/21 0807 09/20/21 1114  BP: 121/82 127/68 108/71 102/71  Pulse: 73 85 82 98  Resp: 18 16 16 20   Temp: 98.3 F (36.8 C) 98.1 F (36.7 C) 98.5 F (36.9 C) 98.5 F (36.9 C)  TempSrc: Oral  Oral Oral  SpO2: 99% 98% 98% 96%  Weight:      Height:        Intake/Output Summary (Last 24 hours) at 09/20/2021 1415 Last data filed at 09/20/2021  1411 Gross per 24 hour  Intake 531.86 ml  Output 1300 ml  Net -768.14 ml    Filed Weights   08/30/21 1936  Weight: 62.1 kg    Examination:  General: A/O x4, No acute respiratory distress Eyes: negative scleral hemorrhage, negative anisocoria, negative icterus ENT: Negative Runny nose, negative gingival bleeding, Neck:  Negative scars, masses, torticollis, lymphadenopathy, JVD Lungs: Clear to auscultation bilaterally without wheezes or crackles Cardiovascular: Regular rate and rhythm without murmur gallop or rub normal S1 and S2 Abdomen: negative abdominal pain, nondistended, positive soft, bowel sounds, no rebound, no ascites, no appreciable mass Extremities: No significant cyanosis, clubbing, or edema bilateral lower extremities Skin: Negative rashes, lesions, ulcers Psychiatric:  Negative depression, negative anxiety, negative fatigue, negative mania, POSITIVE delusions, POSITIVE extremely poor insight Central nervous system:  Cranial nerves II through XII intact, tongue/uvula midline, all extremities muscle strength 5/5 (when sitting).  Patient unable to rise from seated position without moderate assist., sensation intact throughout, negative dysarthria, negative expressive  aphasia, negative receptive aphasia.  .     Data Reviewed: Care during the described time interval was provided by me .  I have reviewed this patient's available data, including medical history, events of note, physical examination, and all test results as part of my evaluation.   CBC: Recent Labs  Lab 09/16/21 0611 09/17/21 0612 09/18/21 0453 09/19/21 0531 09/20/21 0429  WBC 4.1 3.9* 3.8* 3.9* 3.8*  NEUTROABS 2.0 1.8 1.7 1.7 1.7  HGB 7.9* 8.2* 7.1* 7.7* 7.3*  HCT 24.2* 26.1* 22.0* 23.3* 22.7*  MCV 100.0 100.4* 98.7 100.4* 102.3*  PLT 261 275 250 248 237    Basic Metabolic Panel: Recent Labs  Lab 09/16/21 0611 09/17/21 0612 09/18/21 0453 09/19/21 0531 09/20/21 0429  NA 141 138 138 140 142   K 4.7 4.4 4.7 4.4 4.3  CL 107 106 108 107 109  CO2 27 28 27 26 27   GLUCOSE 71 78 71 73 75  BUN 9 10 8  7* 7*  CREATININE 1.09* 1.18* 0.96 1.06* 0.97  CALCIUM 8.1* 7.9* 8.3* 8.2* 8.4*  MG 2.0 2.1 1.9 2.1 1.9  PHOS 5.8* 5.2* 5.3* 5.6* 5.4*    GFR: Estimated Creatinine Clearance: 57 mL/min (by C-G formula based on SCr of 0.97 mg/dL). Liver Function Tests: Recent Labs  Lab 09/16/21 0611 09/17/21 0612 09/18/21 0453 09/19/21 0531 09/20/21 0429  AST 42* 41 36 34 30  ALT 19 19 18 17 14   ALKPHOS 130* 120 90 98 77  BILITOT 0.6 0.6 0.7 0.5 0.5  PROT 5.2* 5.0* 5.1* 5.2* 5.5*  ALBUMIN 1.6* 1.6* 2.3* 2.1* 2.9*    No results for input(s): LIPASE, AMYLASE in the last 168 hours. No results for input(s): AMMONIA in the last 168 hours.  Coagulation Profile: No results for input(s): INR, PROTIME in the last 168 hours. Cardiac Enzymes: No results for input(s): CKTOTAL, CKMB, CKMBINDEX, TROPONINI in the last 168 hours. BNP (last 3 results) No results for input(s): PROBNP in the last 8760 hours. HbA1C: No results for input(s): HGBA1C in the last 72 hours. CBG: No results for input(s): GLUCAP in the last 168 hours. Lipid Profile: No results for input(s): CHOL, HDL, LDLCALC, TRIG, CHOLHDL, LDLDIRECT in the last 72 hours. Thyroid Function Tests: No results for input(s): TSH, T4TOTAL, FREET4, T3FREE, THYROIDAB in the last 72 hours. Anemia Panel: No results for input(s): VITAMINB12, FOLATE, FERRITIN, TIBC, IRON, RETICCTPCT in the last 72 hours.  Urine analysis:    Component Value Date/Time   COLORURINE YELLOW (A) 09/04/2021 0507   APPEARANCEUR HAZY (A) 09/04/2021 0507   LABSPEC 1.004 (L) 09/04/2021 0507   PHURINE 6.0 09/04/2021 0507   GLUCOSEU NEGATIVE 09/04/2021 0507   HGBUR SMALL (A) 09/04/2021 0507   BILIRUBINUR NEGATIVE 09/04/2021 0507   KETONESUR NEGATIVE 09/04/2021 0507   PROTEINUR NEGATIVE 09/04/2021 0507   NITRITE NEGATIVE 09/04/2021 0507   LEUKOCYTESUR LARGE (A) 09/04/2021  0507   Sepsis Labs: @LABRCNTIP (procalcitonin:4,lacticidven:4)  )No results found for this or any previous visit (from the past 240 hour(s)).       Radiology Studies: No results found.      Scheduled Meds:  enoxaparin (LOVENOX) injection  40 mg Subcutaneous Q24H   feeding supplement  237 mL Oral TID BM   folic acid  1 mg Oral Daily   midodrine  10 mg Oral TID WC   mirtazapine  30 mg Oral QHS   multivitamin with minerals  1 tablet Oral Daily   nicotine  14 mg Transdermal Daily   polyethylene  glycol  17 g Oral Daily   thiamine  100 mg Oral Daily   Or   thiamine  100 mg Intravenous Daily   Vitamin D (Ergocalciferol)  50,000 Units Oral Q7 days   Continuous Infusions:    LOS: 20 days   The patient is critically ill with multiple organ systems failure and requires high complexity decision making for assessment and support, frequent evaluation and titration of therapies, application of advanced monitoring technologies and extensive interpretation of multiple databases. Critical Care Time devoted to patient care services described in this note  Time spent: 40 minutes     Maki Hege, Roselind Messier, MD [facility]   If 7PM-7AM, please contact night-coverage 09/20/2021, 2:15 PM

## 2021-09-20 NOTE — TOC Progression Note (Signed)
Transition of Care [facility]) - Progression Note    Patient Details  Name: Jamie Wilkins MRN: [id number] Date of Birth: December 08, 1960  [facility]) CM/SW Contact  Hetty Ely, RN Phone Number: 09/20/2021, 4:11 PM  Clinical Narrative: Gerhard Munch to confirm Medicaid acceptance due to patient informing Dr. Joseph Art that she was approved. Heather Agrew replied back that patient was approved for [facility], however [employer] was in process with working on [employer]. Landis Martins wrote that patient would need to be disabled for more that twelve months to qualify, so she forwarded the request to [facility] for further investigation. TOC to continue to track.    Expected Discharge Plan: Home w Home Health Services Barriers to Discharge: Continued Medical Work up  Expected Discharge Plan and Services Expected Discharge Plan: Home w Home Health Services       Living arrangements for the past 2 months: Single Family Home                                       Social Determinants of Health (SDOH) Interventions    Readmission Risk Interventions No flowsheet data found.

## 2021-09-20 NOTE — Progress Notes (Addendum)
Physical Therapy Treatment Patient Details Name: Jamie Wilkins MRN: [id number] DOB: [birth year] Today's Date: 09/20/2021   History of Present Illness 60 y.o. female who presents to ED following a DSS welfare check; pt and her neighbor called for Meals on Wheels and EMS ultimately brought pt to hospital. Medical history significant for paroxysmal atrial fibrillation not on anticoagulation due to history of GI bleed, alcoholic cirrhosis, anemia.    PT Comments    Pt supine in bed, frustrated demeanor but overall agreeable to therapy if clean up provided, back washed following purwick leakage. Pt is MOD I for supine > sit transfers with HOB elevated, able to manage trunk with bed rails. Pt is inconsistent with transfers requiring between CGA and MIN A pending surface height. Pt does return to chair with decreased eccentric quad loading. Ambulated around nurses station with pt stating fatigue, HR 115 w/ exertion and 1 standing rest break but overall good stability, SUV w/ RW for safety. Skilled PT intervention is indicated to address deficits in function, mobility, and to return to PLOF as able.  Discharge recommendations currently remain SNF to improve endurance, strength, and overall functional mobility, but pt has demonstrated excellent progress and pending continued improvement in mobility may be more appropriate for HHPT.    Recommendations for follow up therapy are one component of a multi-disciplinary discharge planning process, led by the attending physician.  Recommendations may be updated based on patient status, additional functional criteria and insurance authorization.  Follow Up Recommendations  Skilled nursing-short term rehab (<3 hours/day)     Assistance Recommended at Discharge Intermittent Supervision/Assistance  Equipment Recommendations  Rolling walker (2 wheels)    Recommendations for Other Services       Precautions / Restrictions Precautions Precautions:  Fall Precaution Comments: monitor HR Restrictions Weight Bearing Restrictions: No     Mobility  Bed Mobility Overal bed mobility: Needs Assistance Bed Mobility: Supine to Sit     Supine to sit: HOB elevated;Modified independent (Device/Increase time)     General bed mobility comments: utilizes bed rails for trunk managment, no cues necessary w/ slight elevation    Transfers Overall transfer level: Needs assistance Equipment used: Rolling walker (2 wheels) Transfers: Sit to/from Stand Sit to Stand: Min assist;Min guard;From elevated surface           General transfer comment: MIN A from recliner w/ 2 pillows for elevation & BSC; CGA from elevated bed height; no cues for hand placement on RW    Ambulation/Gait Ambulation/Gait assistance: Supervision Gait Distance (Feet): 200 Feet Assistive device: Rolling walker (2 wheels) Gait Pattern/deviations: WFL(Within Functional Limits);Step-through pattern;Decreased step length - right;Decreased step length - left     General Gait Details: no LOB; able to manuever around obstacles and perform cervical rotations without unsteadiness   Stairs             Wheelchair Mobility    Modified Rankin (Stroke Patients Only)       Balance Overall balance assessment: Needs assistance Sitting-balance support: No upper extremity supported;Feet unsupported Sitting balance-Leahy Scale: Good     Standing balance support: No upper extremity supported Standing balance-Leahy Scale: Good Standing balance comment: Able to stand and walk a few steps without support or sx of instability                            Cognition Arousal/Alertness: Awake/alert Behavior During Therapy: WFL for tasks assessed/performed Overall Cognitive Status: Within Functional Limits for  tasks assessed                                 General Comments: Pt continues to verbalize frustration w/ current assistance level but very  agreeable to mobilize        Exercises Other Exercises Other Exercises: Bed > BSC w/ SUPV, required assistance to manage pulling down underwear; MIN A to stand Other Exercises: Pt purwick leaked, assisted with washing/dry back w/ gown change and linens    General Comments        Pertinent Vitals/Pain Pain Assessment: Faces Faces Pain Scale: Hurts a little bit Pain Location: L knee Pain Descriptors / Indicators: Discomfort;Grimacing Pain Intervention(s): Limited activity within patient's tolerance;Monitored during session;Repositioned    Home Living                          Prior Function            PT Goals (current goals can now be found in the care plan section) Progress towards PT goals: Progressing toward goals    Frequency    Min 2X/week      PT Plan Current plan remains appropriate    Co-evaluation              AM-PAC PT "6 Clicks" Mobility   Outcome Measure  Help needed turning from your back to your side while in a flat bed without using bedrails?: None Help needed moving from lying on your back to sitting on the side of a flat bed without using bedrails?: A Little Help needed moving to and from a bed to a chair (including a wheelchair)?: A Little Help needed standing up from a chair using your arms (e.g., wheelchair or bedside chair)?: A Little Help needed to walk in hospital room?: A Little Help needed climbing 3-5 steps with a railing? : A Lot 6 Click Score: 18    End of Session Equipment Utilized During Treatment: Gait belt Activity Tolerance: Patient tolerated treatment well Patient left: in chair;with call bell/phone within reach;with chair alarm set Nurse Communication: Mobility status PT Visit Diagnosis: Muscle weakness (generalized) (M62.81);Adult, failure to thrive (R62.7)     Time: 4536-4680 PT Time Calculation (min) (ACUTE ONLY): 38 min  Charges:                       [employer], SPT

## 2021-09-21 LAB — CBC WITH DIFFERENTIAL/PLATELET
Abs Immature Granulocytes: 0.02 10*3/uL (ref 0.00–0.07)
Basophils Absolute: 0 10*3/uL (ref 0.0–0.1)
Basophils Relative: 1 %
Eosinophils Absolute: 0.2 10*3/uL (ref 0.0–0.5)
Eosinophils Relative: 4 %
HCT: 23.5 % — ABNORMAL LOW (ref 36.0–46.0)
Hemoglobin: 7.5 g/dL — ABNORMAL LOW (ref 12.0–15.0)
Immature Granulocytes: 1 %
Lymphocytes Relative: 41 %
Lymphs Abs: 1.7 10*3/uL (ref 0.7–4.0)
MCH: 32.5 pg (ref 26.0–34.0)
MCHC: 31.9 g/dL (ref 30.0–36.0)
MCV: 101.7 fL — ABNORMAL HIGH (ref 80.0–100.0)
Monocytes Absolute: 0.4 10*3/uL (ref 0.1–1.0)
Monocytes Relative: 9 %
Neutro Abs: 1.8 10*3/uL (ref 1.7–7.7)
Neutrophils Relative %: 44 %
Platelets: 262 10*3/uL (ref 150–400)
RBC: 2.31 MIL/uL — ABNORMAL LOW (ref 3.87–5.11)
RDW: 15.9 % — ABNORMAL HIGH (ref 11.5–15.5)
Smear Review: NORMAL
WBC: 4.1 10*3/uL (ref 4.0–10.5)
nRBC: 0 % (ref 0.0–0.2)

## 2021-09-21 LAB — MAGNESIUM: Magnesium: 1.8 mg/dL (ref 1.7–2.4)

## 2021-09-21 LAB — PHOSPHORUS: Phosphorus: 5.1 mg/dL — ABNORMAL HIGH (ref 2.5–4.6)

## 2021-09-21 MED ORDER — MIDODRINE HCL 10 MG PO TABS: 10.0000 mg | ORAL_TABLET | Freq: Three times a day (TID) | ORAL | 0 refills | Status: DC

## 2021-09-21 MED ORDER — HYDROCORTISONE 1 % EX CREA: TOPICAL_CREAM | CUTANEOUS | 0 refills | Status: AC | PRN

## 2021-09-21 MED ORDER — ZINC OXIDE 12.8 % EX OINT: TOPICAL_OINTMENT | CUTANEOUS | 0 refills | Status: AC | PRN

## 2021-09-21 MED ORDER — MAGNESIUM SULFATE 50 % IJ SOLN: 3.0000 g | Freq: Once | INTRAVENOUS | Status: DC

## 2021-09-21 MED ORDER — ALBUTEROL SULFATE HFA 108 (90 BASE) MCG/ACT IN AERS
1.0000 | INHALATION_SPRAY | Freq: Four times a day (QID) | RESPIRATORY_TRACT | 0 refills | Status: AC | PRN
Start: 2021-09-21 — End: ?

## 2021-09-21 MED ORDER — NICOTINE 14 MG/24HR TD PT24: 14.0000 mg | MEDICATED_PATCH | Freq: Every day | TRANSDERMAL | 0 refills | Status: DC

## 2021-09-21 MED ORDER — LIDOCAINE 4 % EX CREA: TOPICAL_CREAM | Freq: Three times a day (TID) | CUTANEOUS | 0 refills | Status: AC | PRN

## 2021-09-21 MED ORDER — TRAMADOL HCL 50 MG PO TABS: 50.0000 mg | ORAL_TABLET | Freq: Four times a day (QID) | ORAL | 0 refills | Status: DC | PRN

## 2021-09-21 MED ORDER — ACETAMINOPHEN 325 MG PO TABS: 650.0000 mg | ORAL_TABLET | Freq: Four times a day (QID) | ORAL | 0 refills | Status: AC | PRN

## 2021-09-21 MED ORDER — MAGNESIUM SULFATE 2 GM/50ML IV SOLN: 2.0000 g | Freq: Once | INTRAVENOUS | Status: DC

## 2021-09-21 MED ORDER — MIRTAZAPINE 30 MG PO TABS: 30.0000 mg | ORAL_TABLET | Freq: Every day | ORAL | 0 refills | Status: AC

## 2021-09-21 NOTE — Progress Notes (Signed)
Patient requesting to go home. Patient states she has multiple support systems at home, including neighbors and a Conservation officer, historic buildings" that is on call 24/7 willing to help if needed. Patient states she has a friend who checks on her daily.  Patient able to ambulate in room with walker without need for physical assistance from nurse.  Patient states she has a lift chair and lifted-type toilet seat at home. Bed is elevated, per patient.

## 2021-09-21 NOTE — TOC Progression Note (Signed)
Transition of Care [facility]) - Progression Note    Patient Details  Name: Jamie Wilkins MRN: [id number] Date of Birth: 10/19/1961  [facility]) CM/SW Contact  Caryn Section, RN Phone Number: 09/21/2021, 4:50 PM  Clinical Narrative:   patient requests to discharge tonight, as per conversations with DSS they do not have guardianship and patient is okay to ask to go home.    Patient Robynn Pane stated she did not have a walker, but then told RNCM she has walker, wheelchair, BSC.  Patient states friend can transport her home and stay with her tonight.  She states she does not want to stay in the hospital even one more night and she will sign out if she doees not go home.  MD aware, writing discharge.   :Patient cannot get charity home health due to having medicaid family planning.  Medicaid still pending as per [facility].  Patient and care team aware.    Expected Discharge Plan: Home w Home Health Services Barriers to Discharge: Continued Medical Work up  Expected Discharge Plan and Services Expected Discharge Plan: Home w Home Health Services       Living arrangements for the past 2 months: Single Family Home                                       Social Determinants of Health (SDOH) Interventions    Readmission Risk Interventions No flowsheet data found.

## 2021-09-21 NOTE — Discharge Summary (Signed)
Physician Discharge Summary  Jamie Wilkins BRA:[id number] DOB: 05-24-1961 DOA: 08/30/2021  PCP: [facility]  Admit date: 08/30/2021 Discharge date: 09/21/2021  Time spent: 35 minutes  Recommendations for Outpatient Follow-up: Acute metabolic encephalopathy /Hospital Delirium  -10/22-25: Patient having intermittent spells of confusion and disorientation at times consistent with delirium. VBG without hypercapnia and ammonia only minimally elevated, not enough to cause confusion. --Patient is afebrile without leukocytosis or any signs symptoms of infection.   --Monitor clinically, further evaluation if we see signs or symptoms of infection or neurologic changes. --Delirium precautions   Delusions - 10/26 patient delusional.  Therefore LACKS CAPACITY to make medical decisions.  NO HOME support.  Lives alone. -10/27 much more realistic about current health issues.  Understands needs 24-hour care, and has proposed realistic discharge plan involving staying with son and daughter-in-law in [place].  Unfortunately son and daughter-in-law have decided that this is a No go -10/29 resolved -11/1 believe patient has capacity however very manipulative example was asked if she had a wheelchair at the house and initially said no, then was told she have to wait until the a.m. to discharge and changed her story and said she did have wheelchair in the house.   Depression with suicidal ideation:  stable without SI or HI.   Continue mirtazapine.   IVC has been revoked.  Psychiatrist has signed off.   Acute on chronic hypotension, dehydration:  -BP remains soft/low but stable. -Continue midodrine. -Maintain MAP > 65   Bilateral foot pain - Plantar Fascitis (less likely peripheral neuropathy).   Improved. Patient reports now tolerating ambulation well. Tylenol PRN.  Trial of topical lidocaine.   Recommend nighttime splints for this if persistent.   Continue PT.   Paroxysmal  atrial fibrillation with RVR:  HR now controlled.  Rate control agents limited by hypotension.     Hypotension - 10/28 Albumin 50 g x 1 - 10/28 increase Midodrine 10 mg TID -10/30 albumin 50 g x 1 -Resolved    EtOH -No active s/sx's of withdrawal. Counseled regarding cessation of alcohol use and its harmful health effects.  Continue thiamine and multivitamins.   Monitored on CIWA protocol.   ETOH liver cirrhosis:  -Compensated, no acute issues. Monitor.   Anemia of chronic disease, folic acid deficiency:  Lab Results  Component Value Date   HGB 7.5 (L) 09/21/2021   HGB 7.3 (L) 09/20/2021   HGB 7.7 (L) 09/19/2021   HGB 7.1 (L) 09/18/2021   HGB 8.2 (L) 09/17/2021  -Stable - Transfuse for hemoglobin<7   Hypokalemia, hypomagnesemia, hypocalcemia and hypophosphatemia:  -Resolved .  Vitamin D deficiency:  Vitamin D level is 20.3.   Continue vitamin D supplement.   Was taking vitamin D2 50,000 units once a week.     Hypomagnesmia -Magnesium goal>2 -Magnesium IV 3 g   Hypocalcemia -Calcium= 10, will continue to monitor       Right buttock deep tissue injury  Pressure Injury 08/31/21 Buttocks Right Deep Tissue Pressure Injury - Purple or maroon localized area of discolored intact skin or blood-filled blister due to damage of underlying soft tissue from pressure and/or shear. (Active)  08/31/21 1800  Location: Buttocks  Location Orientation: Right  Staging: Deep Tissue Pressure Injury - Purple or maroon localized area of discolored intact skin or blood-filled blister due to damage of underlying soft tissue from pressure and/or shear.  Wound Description (Comments):   Present on Admission: Yes       Discharge Diagnoses:  Principal Problem:  Severe major depression, single episode, without psychotic features (HCC) Active Problems:   Hypocalcemia   Hypokalemia   Hypomagnesemia   Alcoholic hepatitis without ascites   AF (paroxysmal atrial fibrillation) (HCC)    Hypotension   Depression due to physical illness   Decubitus ulcer   Delusions (HCC)   Discharge Condition: Guarded  Diet recommendation: Regular  Filed Weights   08/30/21 1936  Weight: 62.1 kg    History of present illness:  60 y.o. BF PMHx paroxysmal atrial fibrillation not on anticoagulation due to history of GI bleed, alcoholic liver cirrhosis, chronic anemia.     Brought to the hospital at the recommendation of DSS after she was found sitting at home in her feces.  She complained of generalized weakness and poor oral intake.  She felt so weak that she was unable to get up from her chair.    She was admitted to the hospital for dehydration, hypotension, hypokalemia, hypomagnesemia, hypophosphatemia and hypocalcemia.  She was treated with IV fluids and her electrolytes were repleted.  Despite adequate IV resuscitation, blood pressure remained low.  Patient said that her blood pressure normally runs low.  She was started on midodrine for hypotension.  She also atrial fibrillation with RVR but rate control drugs were avoided because of hypotension.  Fortunately, her heart rate has improved.   She was seen by the psychiatrist for depression and suicidal ideation.  She was started on mirtazapine for depression.  She was initially placed on IVC but this has been revoked.  Hospital Course:  See above  Consultations: Psychiatry    Discharge Exam: Vitals:   09/20/21 1550 09/20/21 1929 09/21/21 0701 09/21/21 1206  BP: 123/70 (!) 147/89 111/67 112/75  Pulse: 78 66 90 90  Resp: 18 18 18 16   Temp: 98.8 F (37.1 C) 99.4 F (37.4 C) 97.7 F (36.5 C) 97.8 F (36.6 C)  TempSrc:  Oral  Oral  SpO2: 98% 95% 95% 100%  Weight:      Height:        General: A/O x4, No acute respiratory distress Eyes: negative scleral hemorrhage, negative anisocoria, negative icterus ENT: Negative Runny nose, negative gingival bleeding, Neck:  Negative scars, masses, torticollis, lymphadenopathy,  JVD Lungs: Clear to auscultation bilaterally without wheezes or crackles Cardiovascular: Regular rate and rhythm without murmur gallop or rub normal S1 and S2  Discharge Instructions   Allergies as of 09/21/2021   No Known Allergies      Medication List     TAKE these medications    acetaminophen 325 MG tablet Commonly known as: TYLENOL Take 2 tablets (650 mg total) by mouth every 6 (six) hours as needed for mild pain, fever or headache.   albuterol 108 (90 Base) MCG/ACT inhaler Commonly known as: VENTOLIN HFA Inhale 1-2 puffs into the lungs every 6 (six) hours as needed for wheezing or shortness of breath.   ergocalciferol 1.25 MG (50000 UT) capsule Commonly known as: VITAMIN D2 Take 1 capsule by mouth once a week.   ferrous sulfate 325 (65 FE) MG tablet Commonly known as: FerrouSul Take 1 tablet (325 mg total) by mouth every other day.   folic acid 1 MG tablet Commonly known as: FOLVITE Take 1 tablet by mouth daily.   hydrocortisone cream 1 % Apply topically as needed for itching.   lidocaine 4 % cream Commonly known as: LMX Apply topically 3 (three) times daily as needed (foot pain).   magnesium oxide 400 MG tablet Commonly known as: MAG-OX Take  1 tablet by mouth daily.   midodrine 10 MG tablet Commonly known as: PROAMATINE Take 1 tablet (10 mg total) by mouth 3 (three) times daily with meals.   mirtazapine 30 MG tablet Commonly known as: REMERON Take 1 tablet (30 mg total) by mouth at bedtime.   nicotine 14 mg/24hr patch Commonly known as: NICODERM CQ - dosed in mg/24 hours Place 1 patch (14 mg total) onto the skin daily. Start taking on: September 22, 2021   Thera-M Tabs Take 1 tablet by mouth daily.   thiamine 100 MG tablet Take 1 tablet by mouth daily.   traMADol 50 MG tablet Commonly known as: ULTRAM Take 1 tablet (50 mg total) by mouth every 6 (six) hours as needed for moderate pain or severe pain.   Zinc Oxide 12.8 % ointment Commonly  known as: TRIPLE PASTE Apply topically as needed for irritation.       No Known Allergies    The results of significant diagnostics from this hospitalization (including imaging, microbiology, ancillary and laboratory) are listed below for reference.    Significant Diagnostic Studies: DG Chest Portable 1 View  Result Date: 08/30/2021 CLINICAL DATA:  Weakness. EXAM: PORTABLE CHEST 1 VIEW COMPARISON:  Chest x-ray 07/20/2021, CT chest 07/20/2021, chest x-ray 06/09/2021 FINDINGS: The heart and mediastinal contours are within normal limits. No focal consolidation. No pulmonary edema. Blunting of the right costophrenic angle. No left pleural effusion. No pneumothorax. Vertical linear density overlying the right lung consistent with skin folds. No acute osseous abnormality. IMPRESSION: Blunting of the right costophrenic angle with likely trace pleural effusion. Electronically Signed   By: Tish Frederickson M.D.   On: 08/30/2021 21:04    Microbiology: No results found for this or any previous visit (from the past 240 hour(s)).   Labs: Basic Metabolic Panel: Recent Labs  Lab 09/16/21 0611 09/17/21 0612 09/18/21 0453 09/19/21 0531 09/20/21 0429 09/21/21 0517  NA 141 138 138 140 142  --   K 4.7 4.4 4.7 4.4 4.3  --   CL 107 106 108 107 109  --   CO2 27 28 27 26 27   --   GLUCOSE 71 78 71 73 75  --   BUN 9 10 8  7* 7*  --   CREATININE 1.09* 1.18* 0.96 1.06* 0.97  --   CALCIUM 8.1* 7.9* 8.3* 8.2* 8.4*  --   MG 2.0 2.1 1.9 2.1 1.9 1.8  PHOS 5.8* 5.2* 5.3* 5.6* 5.4* 5.1*   Liver Function Tests: Recent Labs  Lab 09/16/21 0611 09/17/21 0612 09/18/21 0453 09/19/21 0531 09/20/21 0429  AST 42* 41 36 34 30  ALT 19 19 18 17 14   ALKPHOS 130* 120 90 98 77  BILITOT 0.6 0.6 0.7 0.5 0.5  PROT 5.2* 5.0* 5.1* 5.2* 5.5*  ALBUMIN 1.6* 1.6* 2.3* 2.1* 2.9*   No results for input(s): LIPASE, AMYLASE in the last 168 hours. No results for input(s): AMMONIA in the last 168 hours. CBC: Recent Labs   Lab 09/17/21 0612 09/18/21 0453 09/19/21 0531 09/20/21 0429 09/21/21 0517  WBC 3.9* 3.8* 3.9* 3.8* 4.1  NEUTROABS 1.8 1.7 1.7 1.7 1.8  HGB 8.2* 7.1* 7.7* 7.3* 7.5*  HCT 26.1* 22.0* 23.3* 22.7* 23.5*  MCV 100.4* 98.7 100.4* 102.3* 101.7*  PLT 275 250 248 237 262   Cardiac Enzymes: No results for input(s): CKTOTAL, CKMB, CKMBINDEX, TROPONINI in the last 168 hours. BNP: BNP (last 3 results) Recent Labs    07/20/21 1242  BNP 352.7*  ProBNP (last 3 results) No results for input(s): PROBNP in the last 8760 hours.  CBG: No results for input(s): GLUCAP in the last 168 hours.     Signed:  Carolyne Littles, MD [facility]

## 2021-09-21 NOTE — Progress Notes (Signed)
Patient discharged to home in stable condition. Patient ambulated modified I with walker from bed to wheelchair. Patient's friend (who she states is a CNA) to pick her up and stay with her tonight. Patient verbalized understanding of all discharge instructions, including importance of taking midodrine for hypotension as prescribed.

## 2021-09-21 NOTE — Progress Notes (Signed)
Occupational Therapy Treatment Patient Details Name: Jamie Wilkins MRN: [id number] DOB: 01/06/1961 Today's Date: 09/21/2021   History of present illness 60 y.o. female who presents to ED following a DSS welfare check; pt and her neighbor called for Meals on Wheels and EMS ultimately brought pt to hospital. Medical history significant for paroxysmal atrial fibrillation not on anticoagulation due to history of GI bleed, alcoholic cirrhosis, anemia.   OT comments  Ms Mcdiarmid was seen for OT treatment on this date. Upon arrival to room pt reclined in bed, eager and agreeable to tx. Pt requires MIN A for toilet t/f from low commode height, SBA perihygiene c lateral leans. Trialed BSC height transfer - requires no assist from elevated height (24 inches). SUPERVISION for shower transfer using grab bar - reports having tub transfer bench at home. SUPERVISION for household navigation task ~200 ft including managing 1 step x2 trials c L rail. Pt making good progress toward goals. Pt continues to benefit from skilled OT services to maximize return to PLOF and minimize risk of future falls, injury, caregiver burden, and readmission. Will continue to follow POC. Discharge recommendation updated to reflect pt progress.     Recommendations for follow up therapy are one component of a multi-disciplinary discharge planning process, led by the attending physician.  Recommendations may be updated based on patient status, additional functional criteria and insurance authorization.    Follow Up Recommendations  Home health OT    Assistance Recommended at Discharge Intermittent Supervision/Assistance  Equipment Recommendations  [facility]    Recommendations for Other Services      Precautions / Restrictions Precautions Precautions: Fall Restrictions Weight Bearing Restrictions: No       Mobility Bed Mobility Overal bed mobility: Modified Independent                  Transfers Overall transfer level:  Needs assistance Equipment used: Rolling walker (2 wheels) Transfers: Sit to/from Stand Sit to Stand: Min assist           General transfer comment: SBA from elevated height     Balance Overall balance assessment: Needs assistance Sitting-balance support: No upper extremity supported;Feet unsupported Sitting balance-Leahy Scale: Good     Standing balance support: No upper extremity supported Standing balance-Leahy Scale: Good                             ADL either performed or assessed with clinical judgement   ADL Overall ADL's : Needs assistance/impaired                                       General ADL Comments: MIN A for toilet t/f from low commode height, SBA perihygiene c lateral leans. Trialed BSC height transfer - requires no assist from elevated height (24 inches). SUPERVISION for houshold navigation task ~200 ft including managing 1 step x2 trials c L rail.      Cognition Arousal/Alertness: Awake/alert Behavior During Therapy: WFL for tasks assessed/performed Overall Cognitive Status: Within Functional Limits for tasks assessed Area of Impairment: Problem solving                         Safety/Judgement: Decreased awareness of safety;Decreased awareness of deficits   Problem Solving: Slow processing            Exercises Exercises: Other exercises  Other Exercises Other Exercises: Pt educated re: OT role, DME recs, d/c recs, falls prevention, HEP Other Exercises: LBD, sit<>stand, sit<>sup, sitting/standing balance/tolerance, toileting, 1 step, ~200 ft mobility           Pertinent Vitals/ Pain       Pain Assessment: No/denies pain   Frequency  Min 2X/week        Progress Toward Goals  OT Goals(current goals can now be found in the care plan section)  Progress towards OT goals: Progressing toward goals  Acute Rehab OT Goals Patient Stated Goal: to go home OT Goal Formulation: With patient Time For  Goal Achievement: 09/29/21 Potential to Achieve Goals: Fair ADL Goals Pt Will Perform Grooming: with modified independence;standing Pt Will Perform Lower Body Dressing: with min assist;sit to/from stand Pt Will Transfer to Toilet: with modified independence;ambulating;bedside commode Pt Will Perform Toileting - Clothing Manipulation and hygiene: with modified independence;sitting/lateral leans  Plan Frequency remains appropriate;Discharge plan needs to be updated    Co-evaluation                 AM-PAC OT "6 Clicks" Daily Activity     Outcome Measure   Help from another person eating meals?: None Help from another person taking care of personal grooming?: A Little Help from another person toileting, which includes using toliet, bedpan, or urinal?: A Lot Help from another person bathing (including washing, rinsing, drying)?: A Lot Help from another person to put on and taking off regular upper body clothing?: A Little Help from another person to put on and taking off regular lower body clothing?: A Little 6 Click Score: 17    End of Session Equipment Utilized During Treatment: Rolling walker (2 wheels)  OT Visit Diagnosis: Unsteadiness on feet (R26.81);Muscle weakness (generalized) (M62.81)   Activity Tolerance Patient tolerated treatment well   Patient Left in bed;with call bell/phone within reach;with bed alarm set   Nurse Communication Mobility status        Time: [id number] OT Time Calculation (min): 25 min  Charges: OT General Charges $OT Visit: 1 Visit OT Treatments $Self Care/Home Management : 23-37 mins  Dessie Coma, M.S. OTR/L  09/21/21, 4:31 PM  ascom [phone]

## 2021-09-21 NOTE — TOC Progression Note (Signed)
Transition of Care [facility]) - Progression Note    Patient Details  Name: Jamie Wilkins MRN: [id number] Date of Birth: [birth year]  [facility] CM/SW Contact  Caryn Section, RN Phone Number: 09/21/2021, 2:41 PM  Clinical Narrative:   Spoke to patient, states she can walk, and per staff she did bathe herself today.    Per DSS [place] [place], patient is not safe to return home and they may put a hold order, she will speak to her supervisor.  DSS states she cannot care for herself at home at this time.  Darlin Priestly [phone], cell [phone]    Expected Discharge Plan: Home w Home Health Services Barriers to Discharge: Continued Medical Work up  Expected Discharge Plan and Services Expected Discharge Plan: Home w Home Health Services       Living arrangements for the past 2 months: Single Family Home                                       Social Determinants of Health (SDOH) Interventions    Readmission Risk Interventions No flowsheet data found.

## 2021-10-18 ENCOUNTER — Other Ambulatory Visit: Payer: Self-pay

## 2021-10-18 MED ORDER — MIRTAZAPINE 30 MG PO TABS
30.0000 mg | ORAL_TABLET | Freq: Every evening | ORAL | 0 refills | Status: DC
  Filled 2021-10-18: qty 30, 30d supply, fill #0

## 2021-10-18 MED ORDER — HYDROCORTISONE 1 % EX CREA
TOPICAL_CREAM | CUTANEOUS | 0 refills | Status: AC
  Filled 2021-10-18: qty 28, 30d supply, fill #0

## 2021-10-18 MED ORDER — MIDODRINE HCL 5 MG PO TABS
10.0000 mg | ORAL_TABLET | ORAL | 0 refills | Status: DC
  Filled 2021-10-18: qty 180, 30d supply, fill #0

## 2021-10-18 MED ORDER — ALBUTEROL SULFATE HFA 108 (90 BASE) MCG/ACT IN AERS
INHALATION_SPRAY | RESPIRATORY_TRACT | 0 refills | Status: AC
  Filled 2021-10-18: qty 6.7, 25d supply, fill #0

## 2021-12-27 ENCOUNTER — Other Ambulatory Visit: Payer: Self-pay

## 2022-02-02 ENCOUNTER — Other Ambulatory Visit: Payer: Self-pay

## 2022-02-13 ENCOUNTER — Emergency Department: Payer: Self-pay

## 2022-02-13 ENCOUNTER — Other Ambulatory Visit: Payer: Self-pay

## 2022-02-13 ENCOUNTER — Emergency Department
Admission: EM | Admit: 2022-02-13 | Discharge: 2022-02-13 | Disposition: A | Discharge status: Home / Self Care | Payer: Self-pay | Attending: Emergency Medicine | Admitting: Emergency Medicine

## 2022-02-13 DIAGNOSIS — W01198A Fall on same level from slipping, tripping and stumbling with subsequent striking against other object, initial encounter: Secondary | ICD-10-CM | POA: Insufficient documentation

## 2022-02-13 DIAGNOSIS — Y92000 Kitchen of unspecified non-institutional (private) residence as  the place of occurrence of the external cause: Secondary | ICD-10-CM | POA: Insufficient documentation

## 2022-02-13 DIAGNOSIS — S0993XA Unspecified injury of face, initial encounter: Secondary | ICD-10-CM | POA: Diagnosis present

## 2022-02-13 DIAGNOSIS — S0181XA Laceration without foreign body of other part of head, initial encounter: Secondary | ICD-10-CM | POA: Insufficient documentation

## 2022-02-13 MED ORDER — LIDOCAINE-EPINEPHRINE-TETRACAINE (LET) TOPICAL GEL
3.0000 mL | Freq: Once | TOPICAL | Status: AC
  Administered 2022-02-13: 3 mL via TOPICAL
  Filled 2022-02-13: qty 3

## 2022-02-13 MED ORDER — CEPHALEXIN 500 MG PO CAPS: 500.0000 mg | ORAL_CAPSULE | Freq: Three times a day (TID) | ORAL | 0 refills | Status: AC

## 2022-02-13 NOTE — ED Provider Notes (Signed)
? ?Jamie Wilkins ?Provider Note ? ?Patient Contact: 10:57 PM (approximate) ? ? ?History  ? ?Fall ? ? ?HPI ? ?Jamie Wilkins is a 61 y.o. female presents to the emergency department after patient had a mechanical fall.  Patient reports that she tripped over her slippers going to bed.  Patient is accompanied by to lifelong family friends who she contacted after a fall.  Patient would not allow transport by EMS.  Patient denies loss of consciousness.  She states that she has been drinking alcohol tonight.  Family members are available to take patient home.  Patient declined lab work.  She denies chest pain, chest tightness or abdominal pain. ? ?  ? ? ?Physical Exam  ? ?Triage Vital Signs: ?ED Triage Vitals  ?Enc Vitals Group  ?   BP 02/13/22 2134 127/85  ?   Pulse Rate 02/13/22 2134 73  ?   Resp 02/13/22 2134 14  ?   Temp 02/13/22 2134 98 ?F (36.7 ?C)  ?   Temp Source 02/13/22 2134 Axillary  ?   SpO2 02/13/22 2134 96 %  ?   Weight 02/13/22 2135 130 lb (59 kg)  ?   Height 02/13/22 2135 5\' 6"  (1.676 m)  ?   Head Circumference --   ?   Peak Flow --   ?   Pain Score 02/13/22 2135 0  ?   Pain Loc --   ?   Pain Edu? --   ?   Excl. in GC? --   ? ? ?Most recent vital signs: ?Vitals:  ? 02/13/22 2134  ?BP: 127/85  ?Pulse: 73  ?Resp: 14  ?Temp: 98 ?F (36.7 ?C)  ?SpO2: 96%  ? ? ? ?General: Alert and in no acute distress. ?Eyes:  PERRL. EOMI. ?Head: No acute traumatic findings ?ENT: ?     Ears: TMs are pearly. ?     Nose: No congestion/rhinnorhea. ?     Mouth/Throat: Mucous membranes are moist.  ?Neck: No stridor. No cervical spine tenderness to palpation. ?Cardiovascular:  Good peripheral perfusion ?Respiratory: Normal respiratory effort without tachypnea or retractions. Lungs CTAB. Good air entry to the bases with no decreased or absent breath sounds. ?Gastrointestinal: Bowel sounds ?4 quadrants. Soft and nontender to palpation. No guarding or rigidity. No palpable masses. No distention. No CVA  tenderness. ?Musculoskeletal: Full range of motion to all extremities.  ?Neurologic:  No gross focal neurologic deficits are appreciated.  ?Skin: Patient has a 3 cm chin laceration deep to underlying dermis. ? ? ? ?ED Results / Procedures / Treatments  ? ?Labs ?(all labs ordered are listed, but only abnormal results are displayed) ?Labs Reviewed - No data to display ? ? ? ?RADIOLOGY ? ?I personally viewed and evaluated these images as part of my medical decision making, as well as reviewing the written report by the radiologist. ? ?ED Provider Interpretation: I personally reviewed CTs of patient's face, head and neck there was no evidence of facial fracture, intracranial bleed, skull fracture or C-spine fracture.  I agree with radiologist interpretation. ? ? ?PROCEDURES: ? ?Critical Care performed: No ? ?2135.Laceration Repair ? ?Date/Time: 02/13/2022 11:00 PM ?Performed by: 02/15/2022 M, PA-C ?Authorized by: Pia Mau M, PA-C  ? ?Consent:  ?  Consent obtained:  Verbal ?  Risks discussed:  Infection and pain ?Universal protocol:  ?  Procedure explained and questions answered to patient or proxy's satisfaction: yes   ?  Patient identity confirmed:  Verbally with patient ?Anesthesia:  ?  Anesthesia method:  Topical application ?  Topical anesthetic:  LET ?Laceration details:  ?  Location:  Face ?  Face location:  Chin ?  Length (cm):  3 ?  Depth (mm):  5 ?Exploration:  ?  Limited defect created (wound extended): no   ?  Contaminated: no   ?Treatment:  ?  Area cleansed with:  Povidone-iodine ?  Irrigation volume:  500 ?  Debridement:  None ?Skin repair:  ?  Repair method:  Sutures ?  Suture size:  4-0 ?  Suture material:  Chromic gut ?  Number of sutures:  6 ?Approximation:  ?  Approximation:  Close ?Repair type:  ?  Repair type:  Simple ?Post-procedure details:  ?  Dressing:  Non-adherent dressing ? ? ?MEDICATIONS ORDERED IN ED: ?Medications  ?lidocaine-EPINEPHrine-tetracaine (LET) topical gel (has no  administration in time range)  ? ? ? ?IMPRESSION / MDM / ASSESSMENT AND PLAN / ED COURSE  ?I reviewed the triage vital signs and the nursing notes. ?             ?               ? ?Assessment and plan ?Fall ? ?Differential diagnosis includes, but is not limited to, laceration, facial contusion, facial fracture, C-spine fracture, intracranial bleed ? ?62 year old female presents to the emergency department after mechanical fall. ? ?Vital signs are reassuring at triage.  On physical exam, patient was alert, active and nontoxic-appearing.  She was oriented to time person and place.  She adamantly declined blood work but consented for CTs of the head, face and cervical spine which showed no acute abnormality.  Laceration was repaired in the emergency department without complication.  Caretakers feel comfortable taking patient home.  Return precautions were given to return to the emergency department with new or worsening symptoms. ?  ? ? ?FINAL CLINICAL IMPRESSION(S) / ED DIAGNOSES  ? ?Final diagnoses:  ?Facial laceration, initial encounter  ? ? ? ?Rx / DC Orders  ? ?ED Discharge Orders   ? ?      Ordered  ?  cephALEXin (KEFLEX) 500 MG capsule  3 times daily       ? 02/13/22 2255  ? ?  ?  ? ?  ? ? ? ?Note:  This document was prepared using Dragon voice recognition software and may include unintentional dictation errors. ?  ?Orvil Feil, PA-C ?02/13/22 2301 ? ?  ?Chesley Noon, MD ?02/17/22 1042 ? ?

## 2022-02-13 NOTE — ED Triage Notes (Addendum)
Pt states was ambulating into kitchen when she fell striking chin. Pt with laceration noted to chin with controlled bleeding, no loc. Pt states "I just don't know what made me fall". Pt denies dizziness. Pt not sure if she lost her footing or tripped, may have gotten tangled in her slippers. Pt is declining ekg and blood work at this time. Pt also declines ct head and neck from triage, would like to be evaluated by md first.  ?

## 2022-02-13 NOTE — ED Notes (Signed)
Pt verbalized understanding of discharge instructions, medication prescriptions, and follow-up care instructions. Pt advise dif symptoms worsen to return to ED. Pt declined discharge vital signs. E-signature not available due to e-signature pad not working.  ?

## 2022-02-13 NOTE — Discharge Instructions (Addendum)
Take Keflex 3 times daily for the next 7 days. ?Have external sutures removed in 5 days. ?

## 2022-02-28 ENCOUNTER — Telehealth: Payer: Self-pay | Admitting: Pharmacist

## 2022-02-28 NOTE — Telephone Encounter (Signed)
Patient failed to provide requested 2022/2023 financial documentation. No additional medication assistance will be provided by [facility] without the required proof of income documentation. Patient notified by letter. ?Jamie Wilkins ?Administrative Assistant ?[facility] ?

## 2022-03-02 ENCOUNTER — Other Ambulatory Visit: Payer: Self-pay

## 2022-03-03 IMAGING — DX DG CHEST 1V PORT
1 series · 2 of 2 positions shown · non-contrast
Comparison: CT chest 02/15/2021.  PA and lateral chest 02/14/2021.

CLINICAL DATA: Weakness and shortness of breath on exertion for 2
days.

EXAM:
PORTABLE CHEST 1 VIEW

[Series 1: chest ap · 0.14mm/px · 2 of 2 slices shown]
[im 1/2]
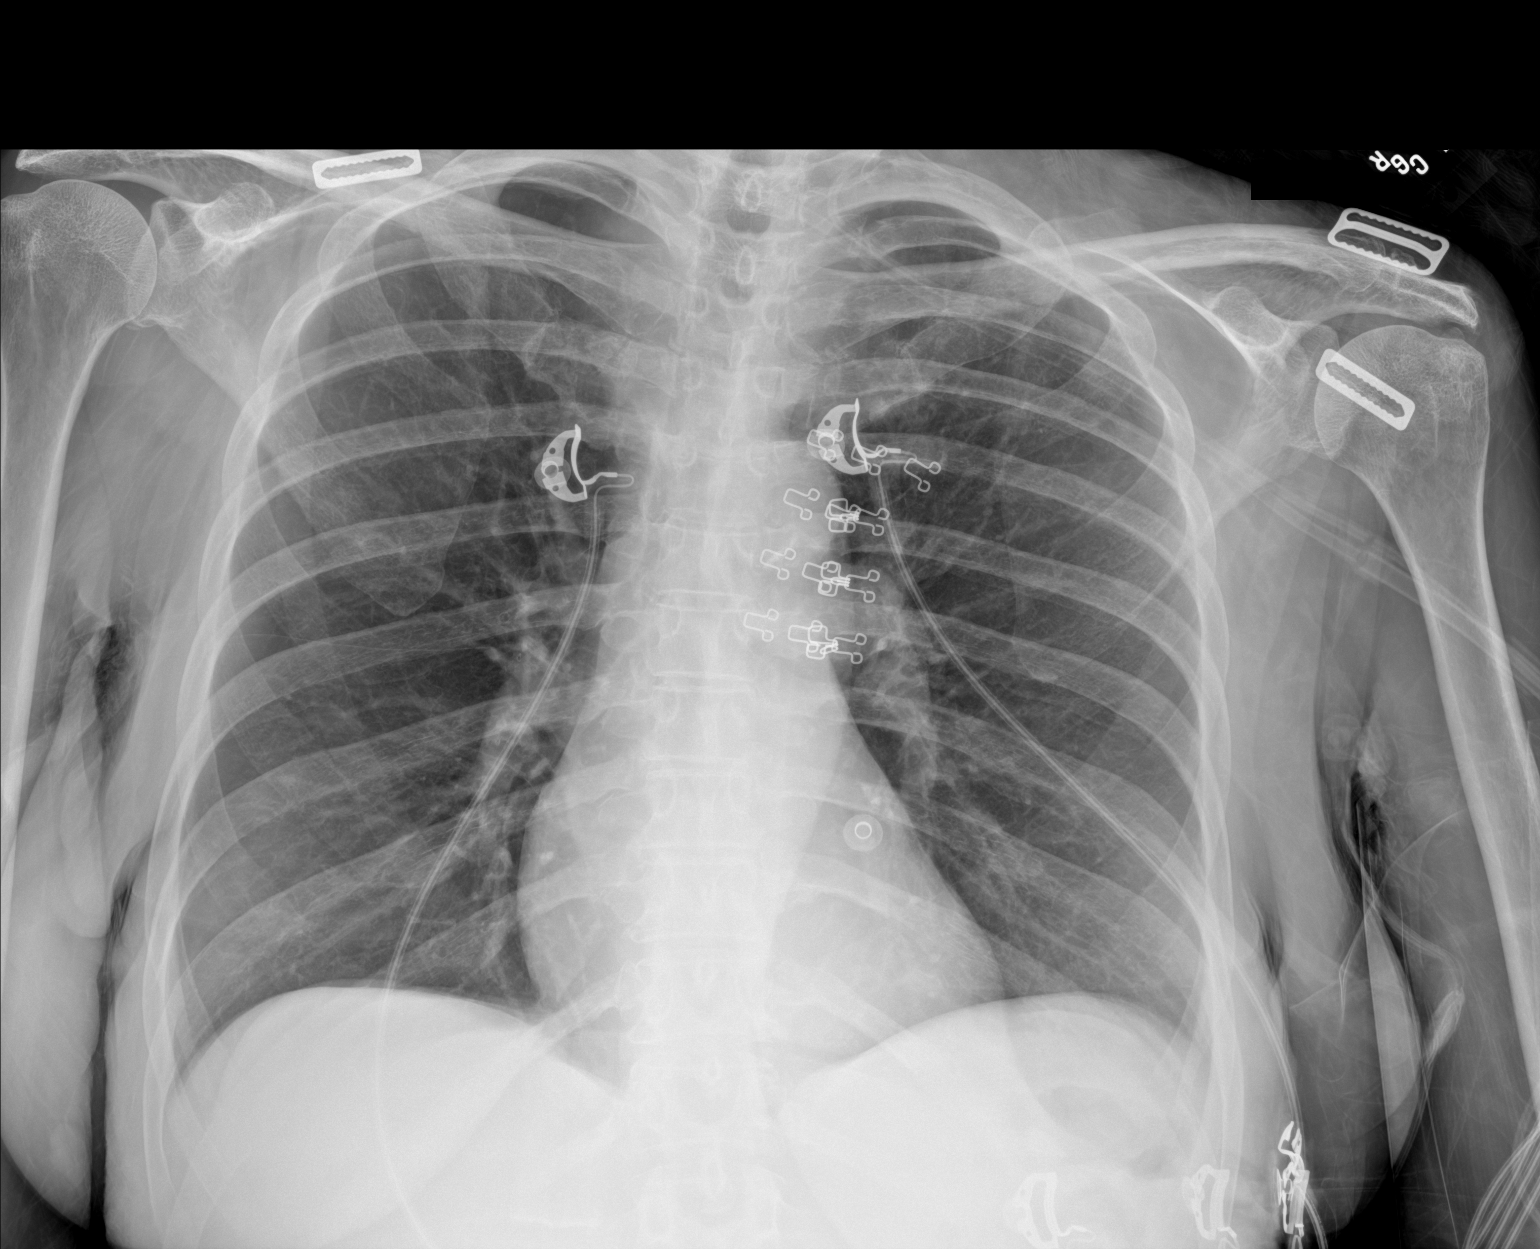
[im 2/2]
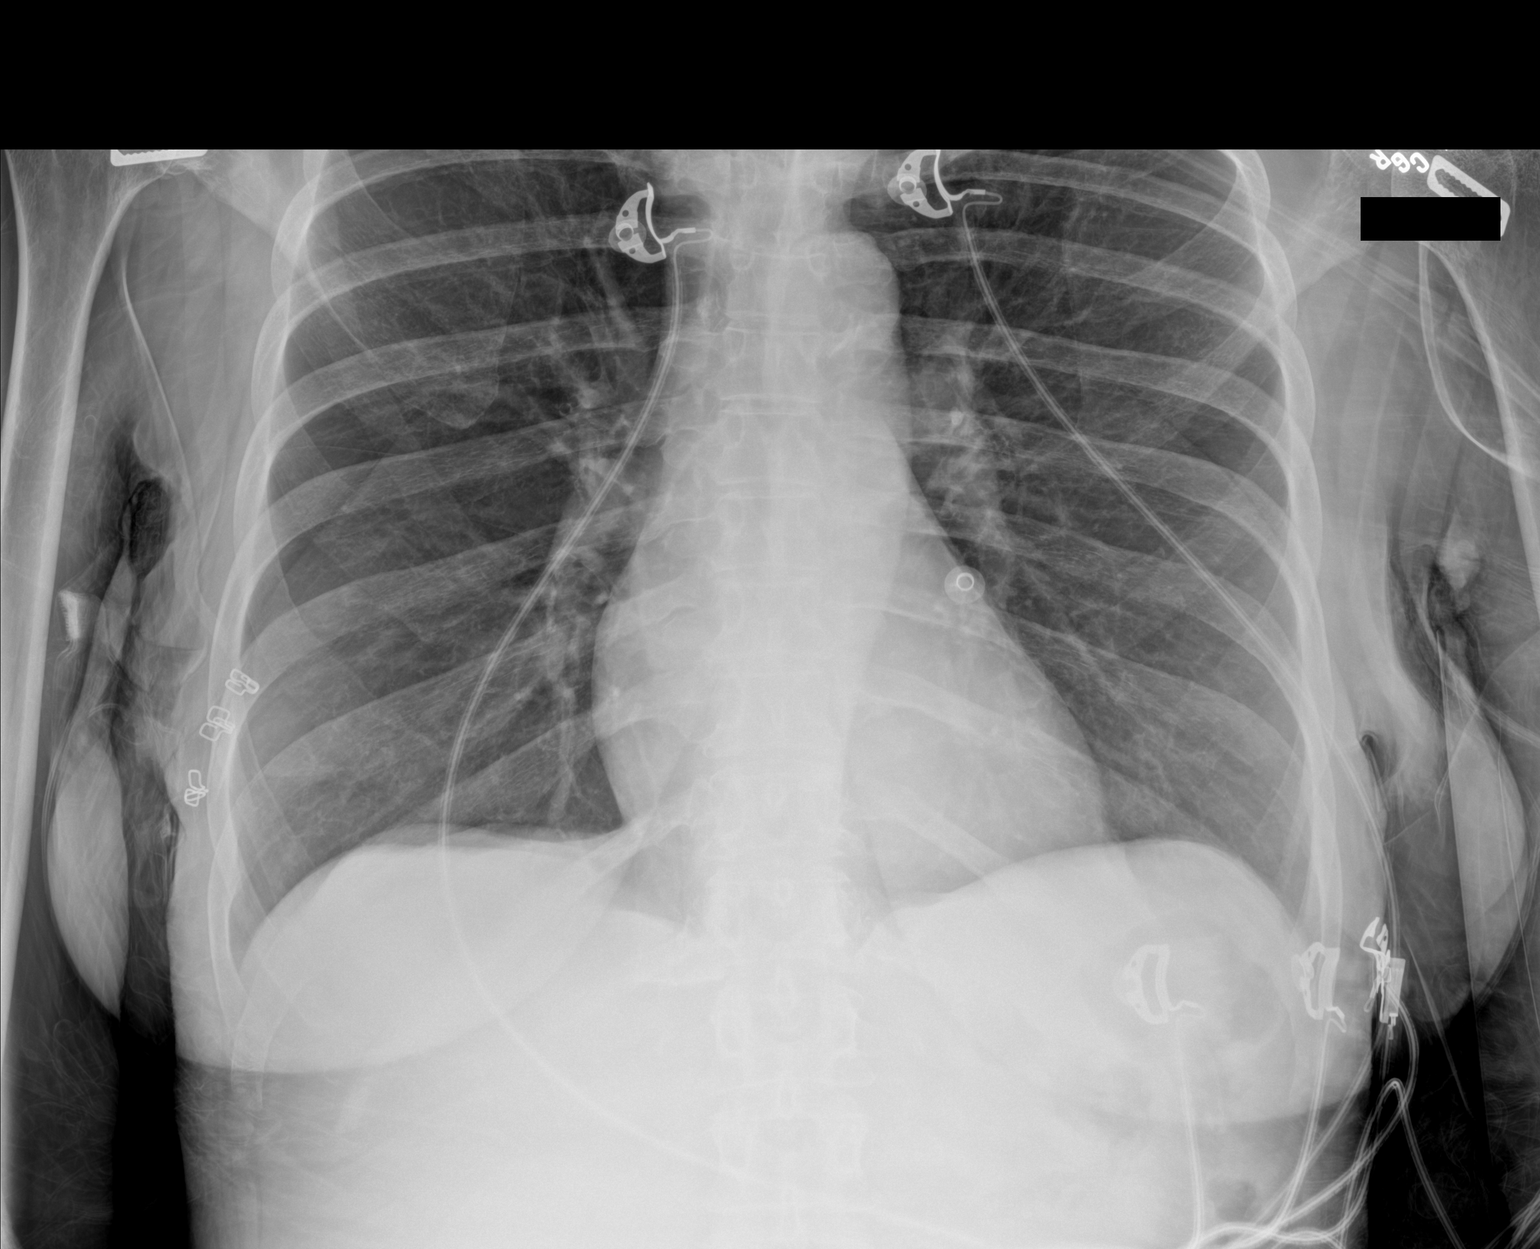

[2 of 2 positions shown; findings below may reference images not displayed]

FINDINGS: The lungs are emphysematous but clear. Heart size is normal. Aortic
atherosclerosis noted. No pneumothorax or pleural fluid. No acute or
focal bony abnormality.
IMPRESSION: No acute disease.

Aortic Atherosclerosis (9X31Z-[id number].U) and Emphysema ([id number].K).

## 2022-09-22 ENCOUNTER — Other Ambulatory Visit: Payer: Self-pay | Admitting: Family Medicine

## 2022-09-22 DIAGNOSIS — Z1231 Encounter for screening mammogram for malignant neoplasm of breast: Secondary | ICD-10-CM

## 2022-11-07 IMAGING — CT CT CERVICAL SPINE W/O CM
3 of 4 series · 12 of 33 positions shown, 14 images · non-contrast
Comparison: None.

CLINICAL DATA: Recent fall with headaches and neck pain, initial
encounter



[Series 7: sag bone · sagittal · 0.29mm/px · 5 of 77 slices shown, 6 images]
[im 26/77  bone]
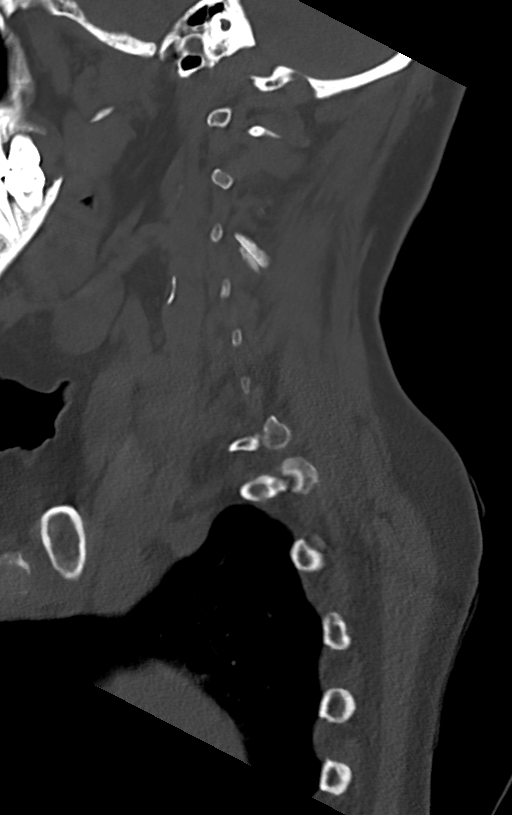
[im 32/77  bone]
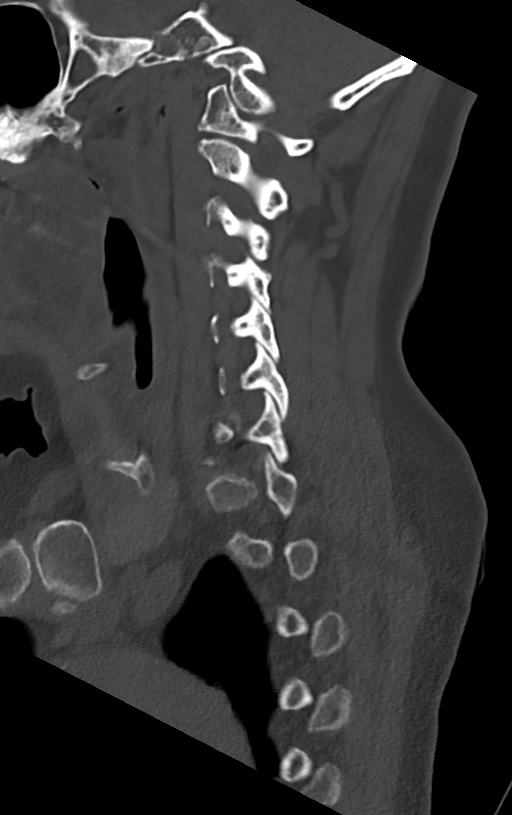
[im 39/77  soft-tissue]
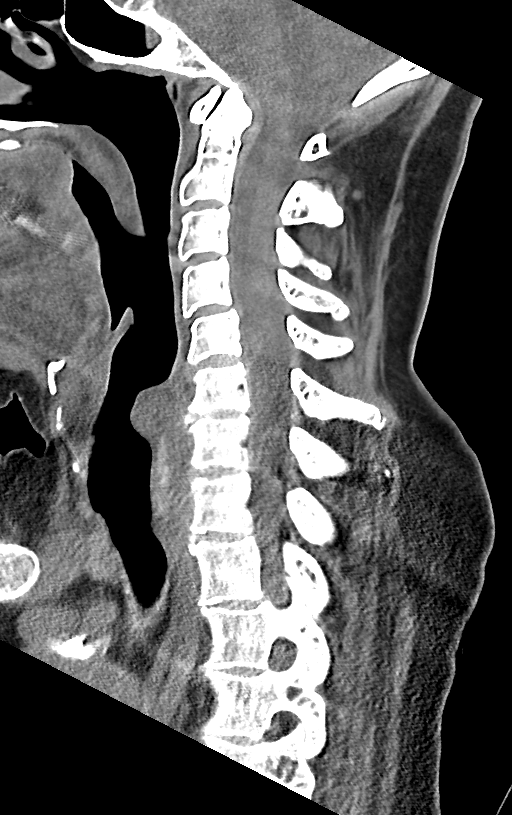
[im 39/77  bone]
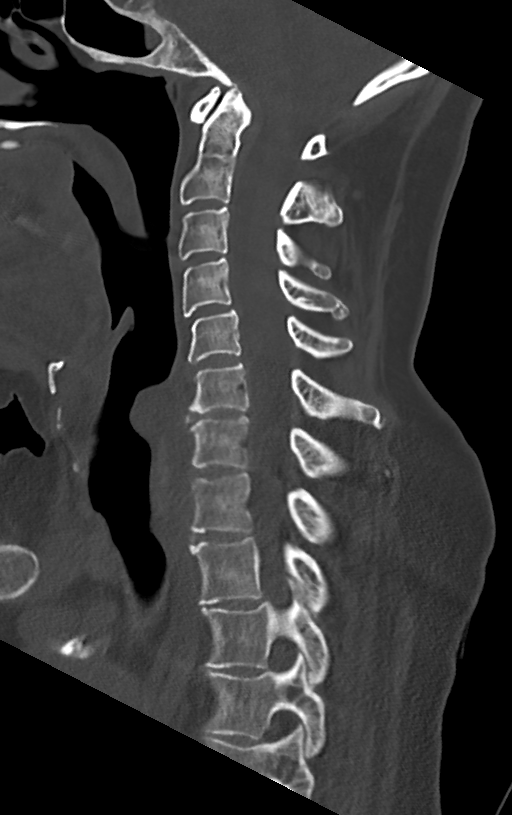
[im 45/77  bone]
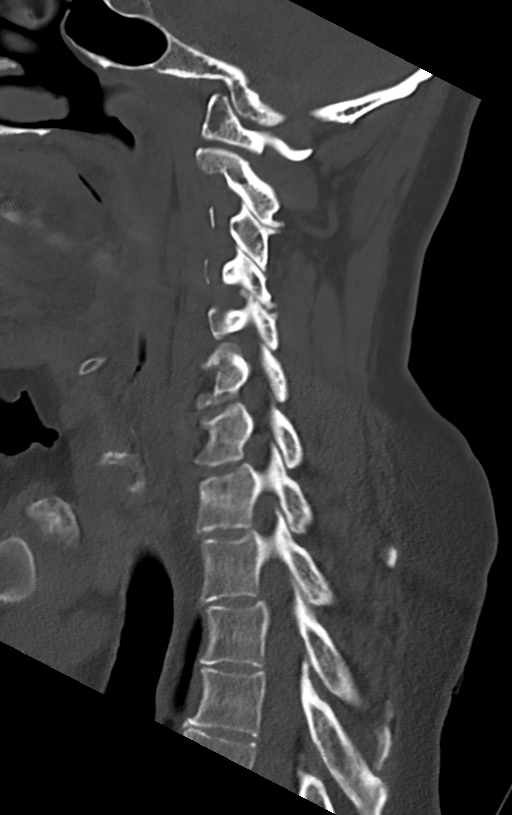
[im 51/77  bone]
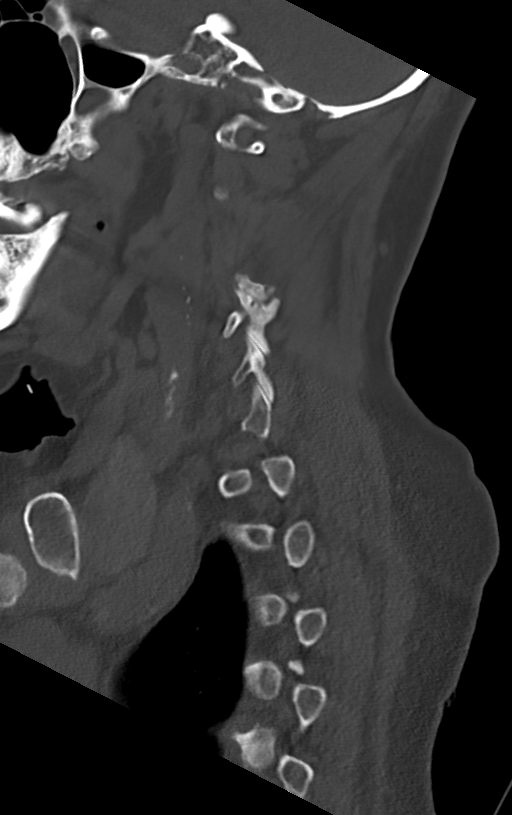

[Series 8: cor bone · coronal · 0.30mm/px · 3 of 73 slices shown]
[im 21/73  bone]
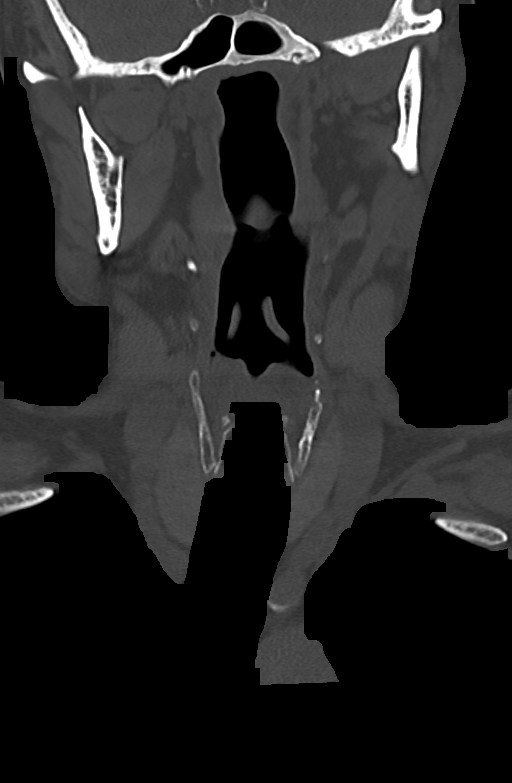
[im 32/73  bone]
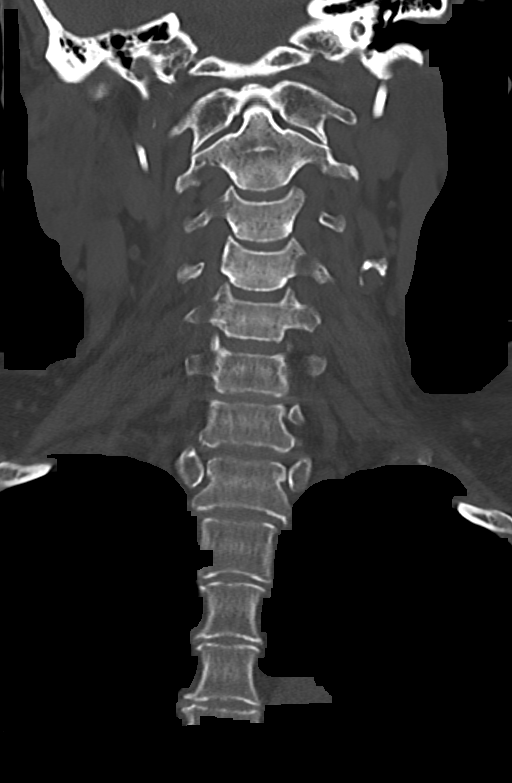
[im 42/73  bone]
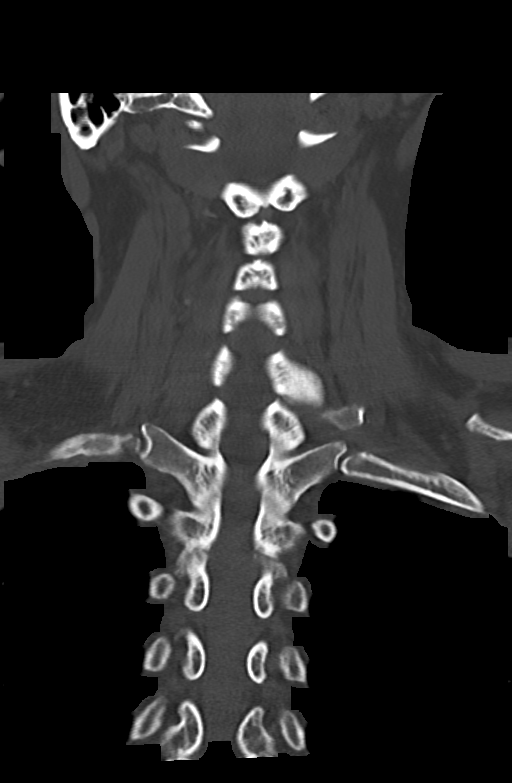

[Series 9: orthogonal axials · axial · 0.21mm/px · z∈[-286,-166]mm · 4 of 107 slices shown, 5 images]
[im 18/107  soft-tissue]
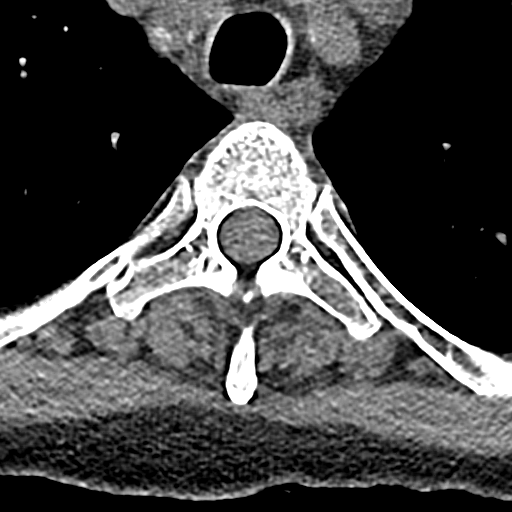
[im 18/107  bone]
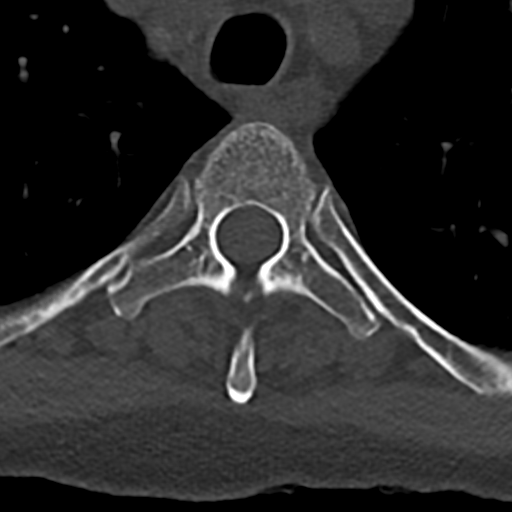
[im 36/107  bone]
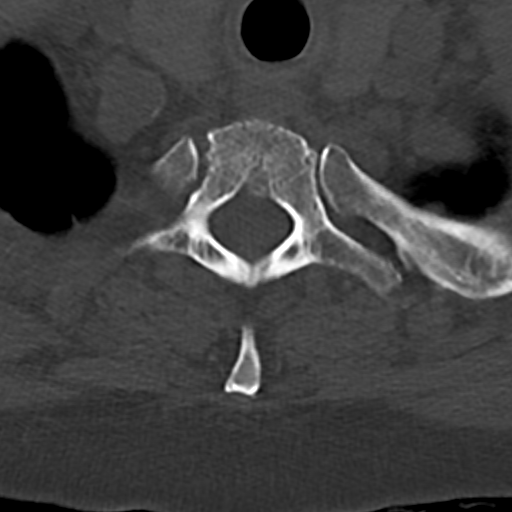
[im 71/107  bone]
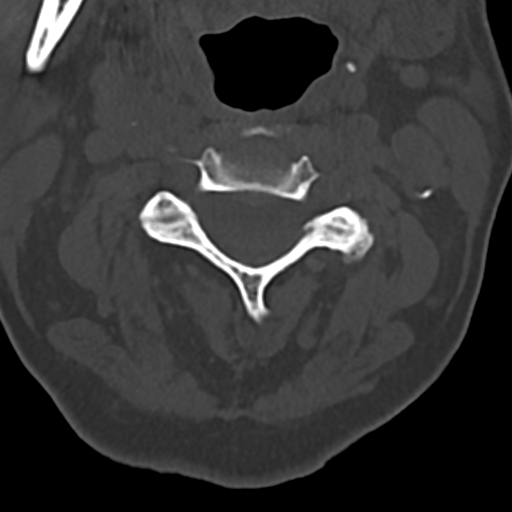
[im 89/107  bone]
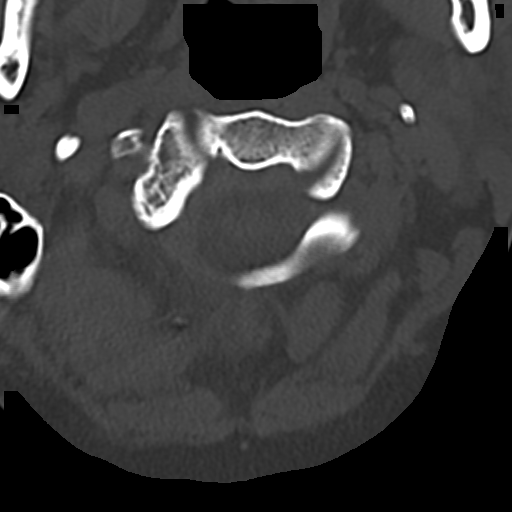

[12 of 33 positions shown; findings below may reference images not displayed]

FINDINGS: CT HEAD FINDINGS

Brain: No evidence of acute infarction, hemorrhage, hydrocephalus,
extra-axial collection or mass lesion/mass effect. Mild atrophic
changes are noted.

Vascular: No hyperdense vessel or unexpected calcification.

Skull: Normal. Negative for fracture or focal lesion.

Other: None.

CT MAXILLOFACIAL FINDINGS

Osseous: No fracture or mandibular dislocation. No destructive
process.

Orbits: Orbits and their contents are within normal limits.

Sinuses: Paranasal sinuses are within normal limits.

Soft tissues: Laceration is noted over the chin with a small amount
of subcutaneous air.

CT CERVICAL SPINE FINDINGS

Alignment: Straightening of the normal cervical lordosis is noted.

Skull base and vertebrae: 7 cervical segments are well visualized.
Vertebral body height is well maintained. Mild osteophytic changes
are seen. Facet hypertrophic changes are noted. No acute fracture or
acute facet abnormality is noted.

Soft tissues and spinal canal: Vascular calcifications are noted. No
acute soft tissue abnormality is seen.

Upper chest: Visualized lung apices are within normal limits.

Other: None
IMPRESSION: CT of the head: Chronic atrophic changes without acute abnormality.

CT of the maxillofacial bones: No acute bony abnormality is noted.

Soft tissue laceration is noted in the chin without significant
hematoma.

CT of the cervical spine: Degenerative changes without acute
abnormality.

## 2022-11-07 IMAGING — CT CT HEAD W/O CM
4 series · 16 of 47 positions shown, 18 images · non-contrast
Comparison: None.

CLINICAL DATA: Recent fall with headaches and neck pain, initial
encounter



[Series 2: head wo · axial · 0.40mm/px · z∈[-140,-30]mm · 6 of 32 slices shown, 8 images]
[im 5/32  brain]
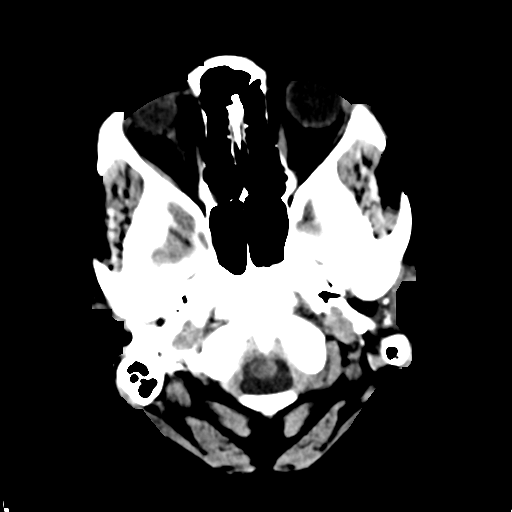
[im 5/32  bone]
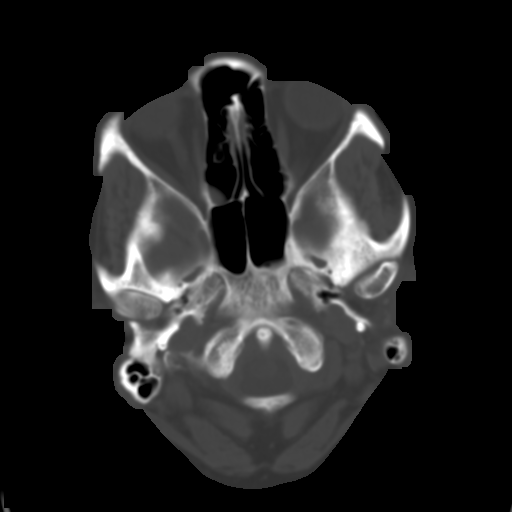
[im 9/32  brain]
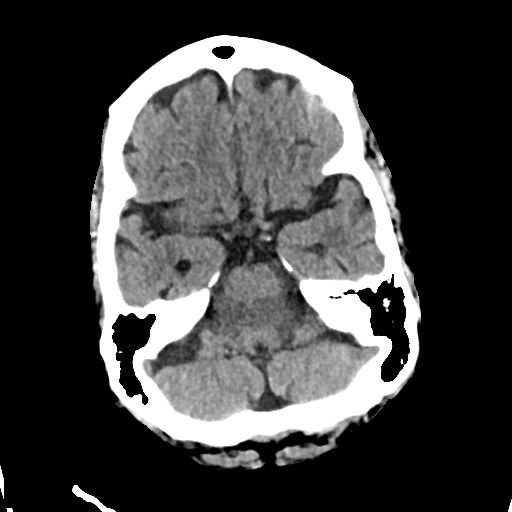
[im 14/32  brain]
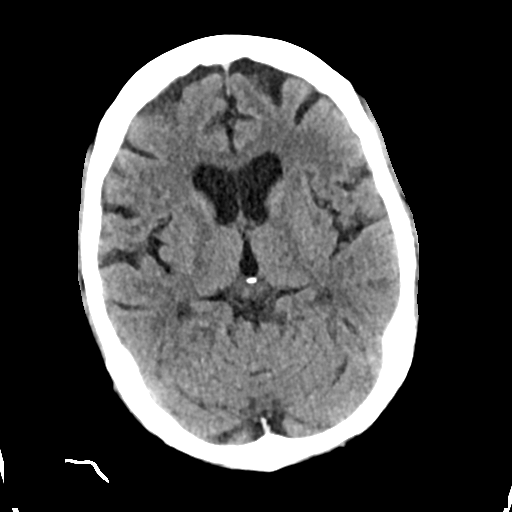
[im 18/32  brain]
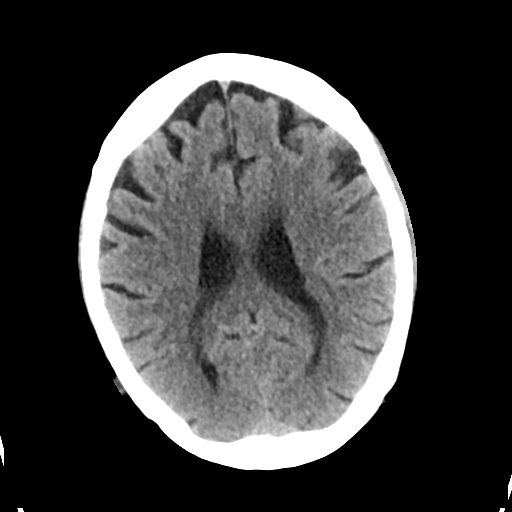
[im 23/32  brain]
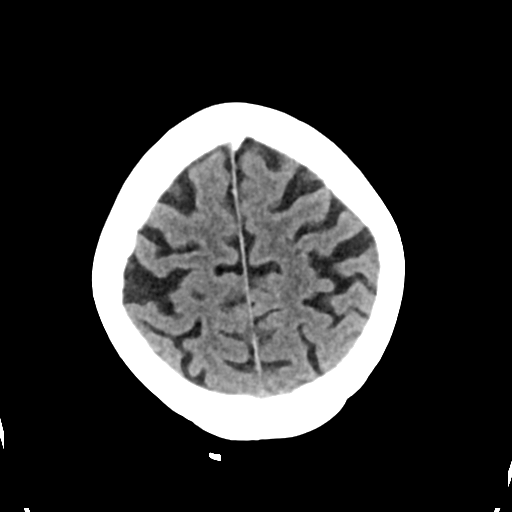
[im 23/32  bone]
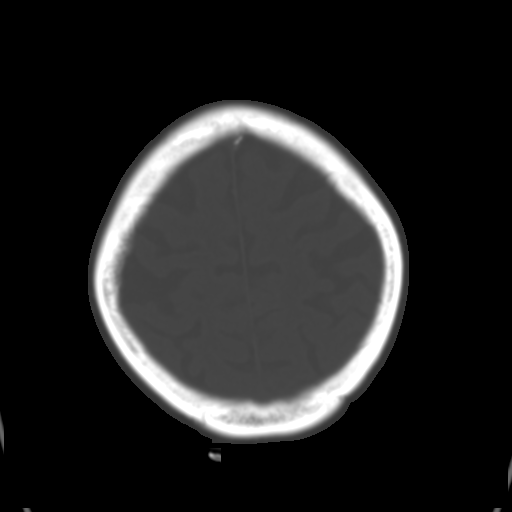
[im 27/32  brain]
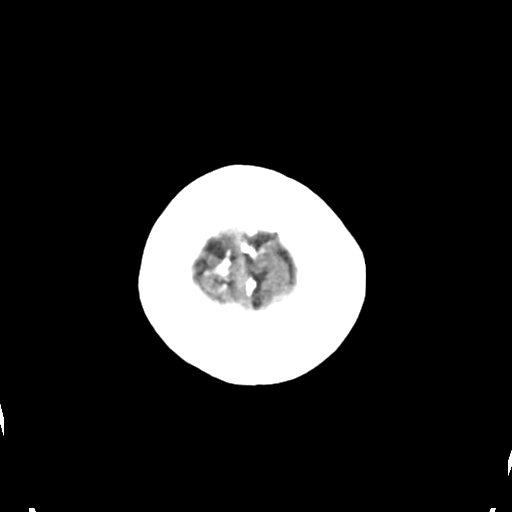

[Series 3: head bone · axial · 0.40mm/px · z∈[-146,-92]mm · 4 of 81 slices shown]
[im 8/81  bone]
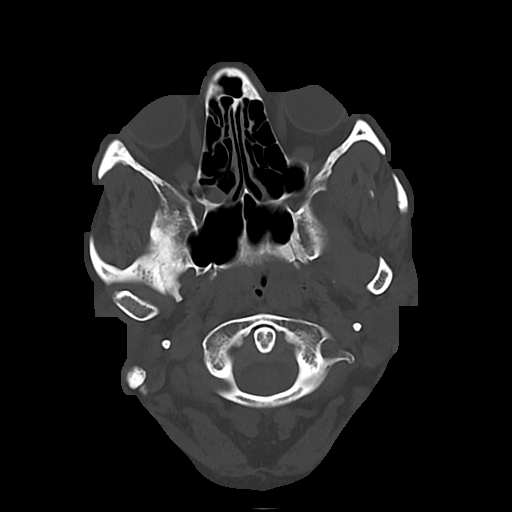
[im 16/81  bone]
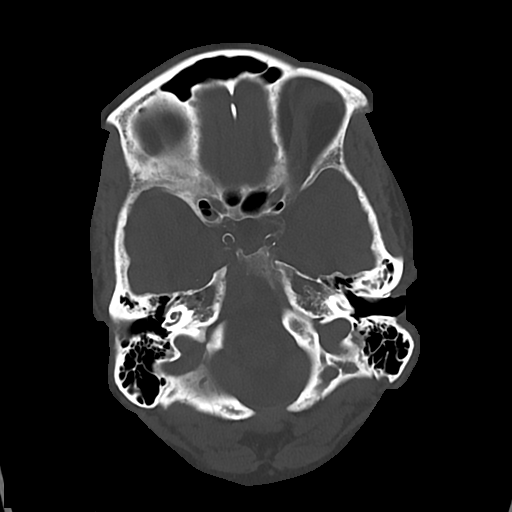
[im 27/81  bone]
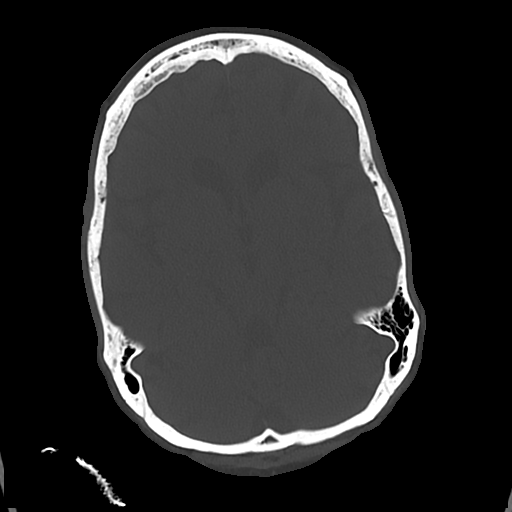
[im 35/81  bone]
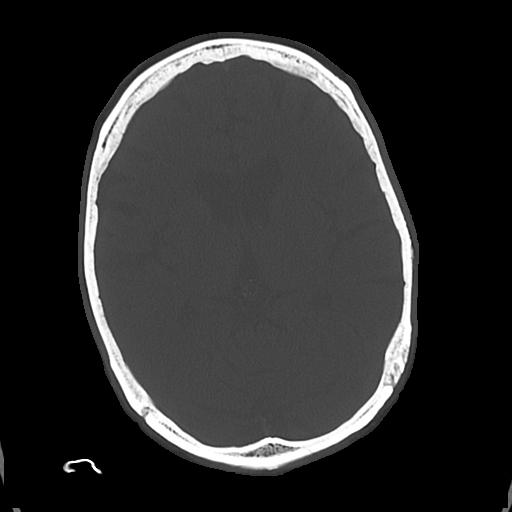

[Series 4: cor soft · coronal · 0.33mm/px · 3 of 63 slices shown]
[im 21/63  brain]
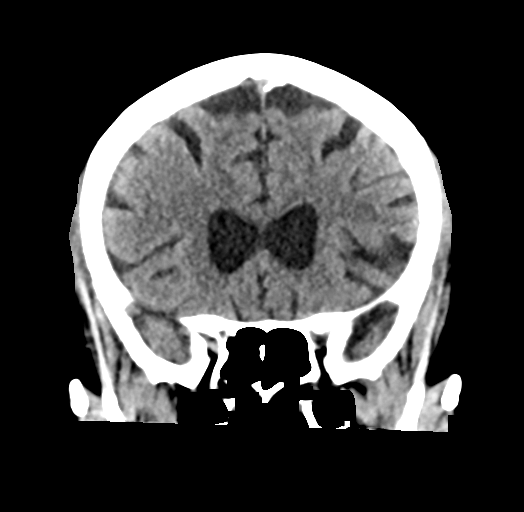
[im 28/63  brain]
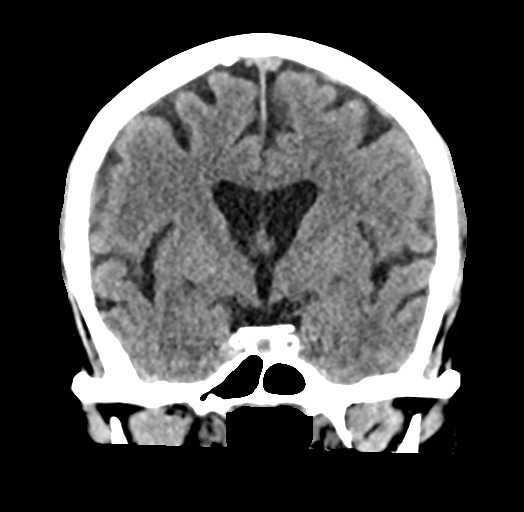
[im 35/63  brain]
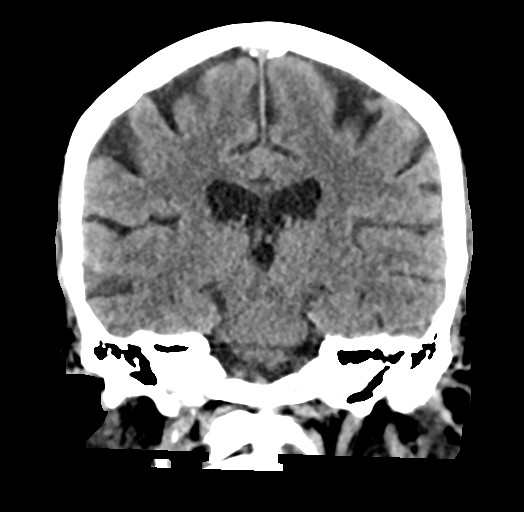

[Series 5: sag soft · sagittal · 0.33mm/px · 3 of 56 slices shown]
[im 19/56  brain]
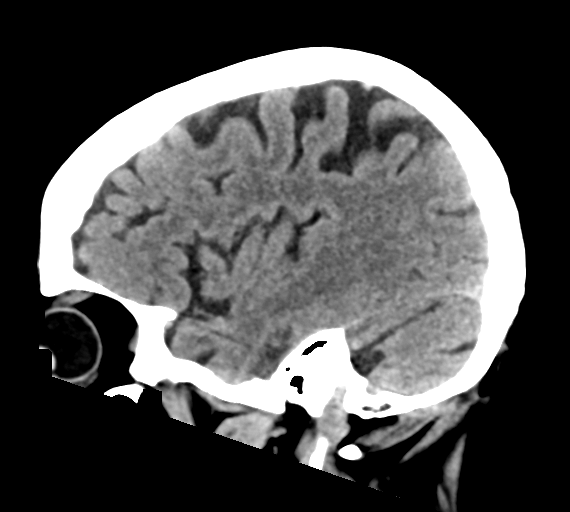
[im 28/56  brain]
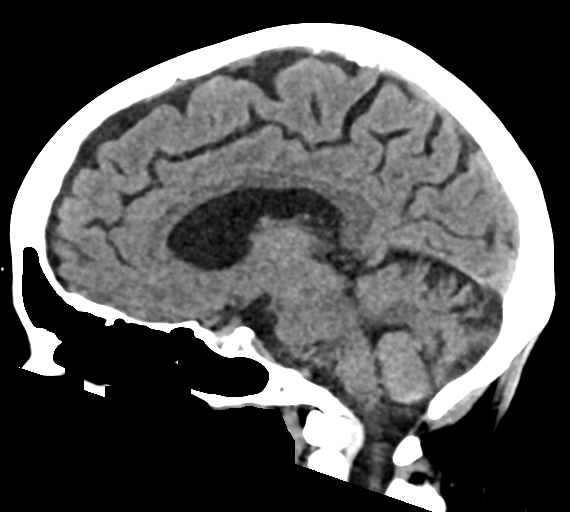
[im 37/56  brain]
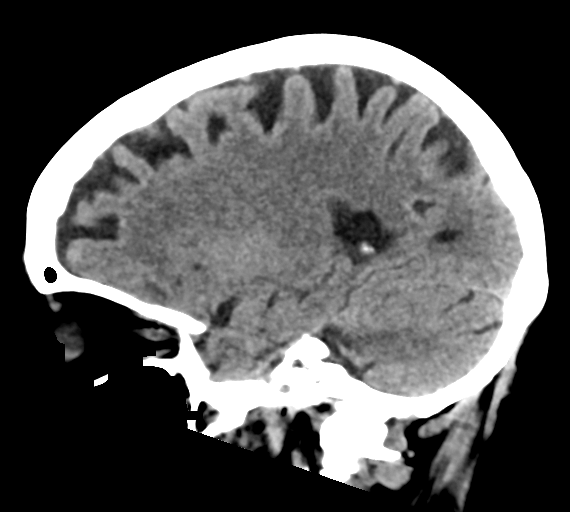

[16 of 47 positions shown; findings below may reference images not displayed]

FINDINGS: CT HEAD FINDINGS

Brain: No evidence of acute infarction, hemorrhage, hydrocephalus,
extra-axial collection or mass lesion/mass effect. Mild atrophic
changes are noted.

Vascular: No hyperdense vessel or unexpected calcification.

Skull: Normal. Negative for fracture or focal lesion.

Other: None.

CT MAXILLOFACIAL FINDINGS

Osseous: No fracture or mandibular dislocation. No destructive
process.

Orbits: Orbits and their contents are within normal limits.

Sinuses: Paranasal sinuses are within normal limits.

Soft tissues: Laceration is noted over the chin with a small amount
of subcutaneous air.

CT CERVICAL SPINE FINDINGS

Alignment: Straightening of the normal cervical lordosis is noted.

Skull base and vertebrae: 7 cervical segments are well visualized.
Vertebral body height is well maintained. Mild osteophytic changes
are seen. Facet hypertrophic changes are noted. No acute fracture or
acute facet abnormality is noted.

Soft tissues and spinal canal: Vascular calcifications are noted. No
acute soft tissue abnormality is seen.

Upper chest: Visualized lung apices are within normal limits.

Other: None
IMPRESSION: CT of the head: Chronic atrophic changes without acute abnormality.

CT of the maxillofacial bones: No acute bony abnormality is noted.

Soft tissue laceration is noted in the chin without significant
hematoma.

CT of the cervical spine: Degenerative changes without acute
abnormality.

## 2022-11-07 IMAGING — CT CT MAXILLOFACIAL W/O CM
3 of 6 series · 15 of 47 positions shown, 18 images · non-contrast
Comparison: None.

CLINICAL DATA: Recent fall with headaches and neck pain, initial
encounter



[Series 2: maxilllofacial 2.0 hr40 3 · axial · 0.33mm/px · z∈[-250,-96]mm · 10 of 91 slices shown, 13 images]
[im 7/91  brain]
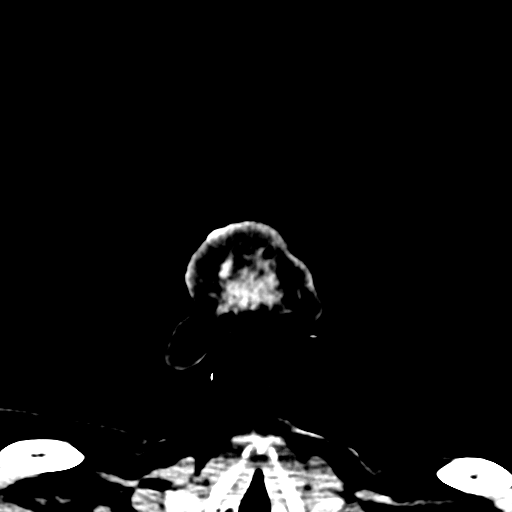
[im 7/91  bone]
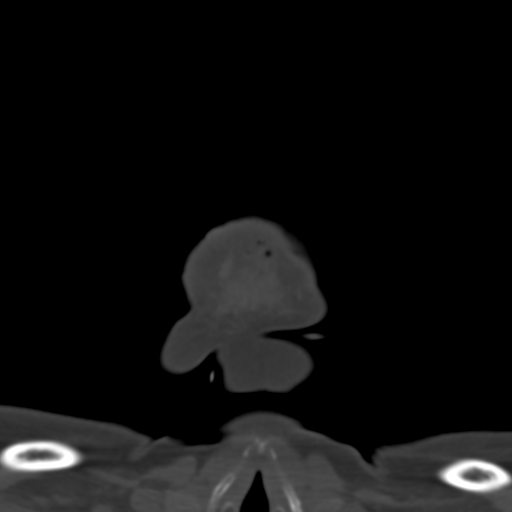
[im 13/91  bone]
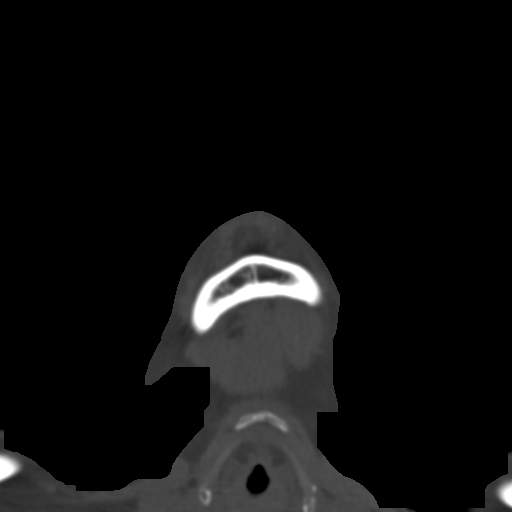
[im 26/91  bone]
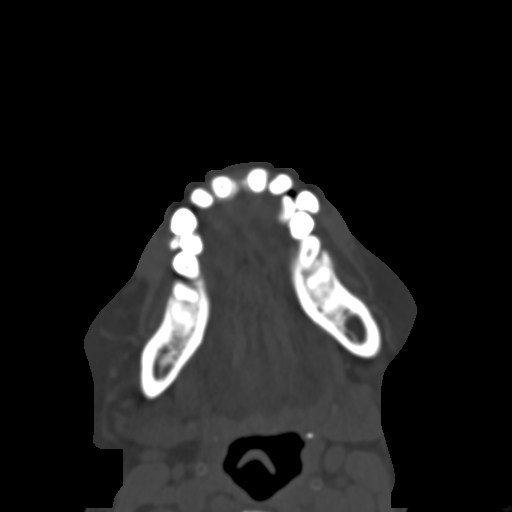
[im 33/91  bone]
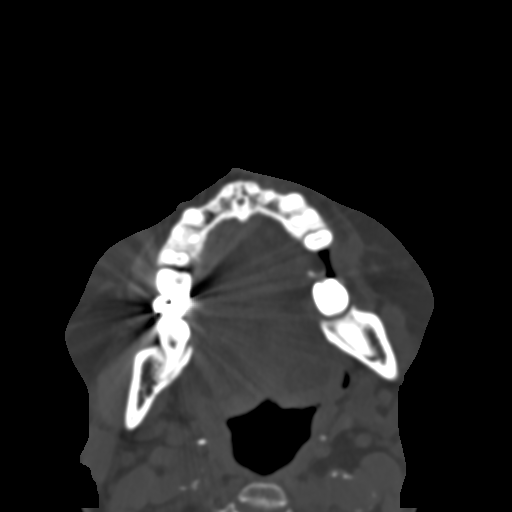
[im 39/91  brain]
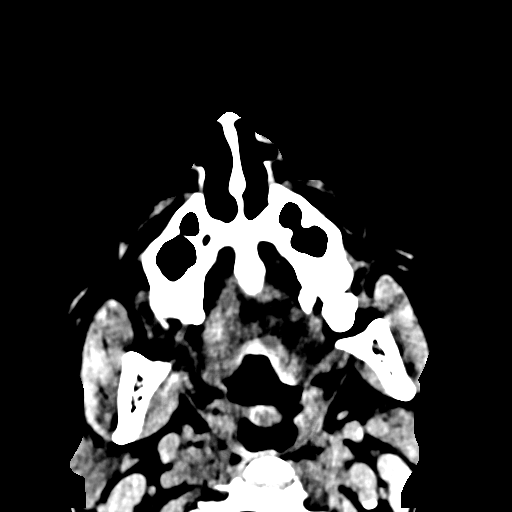
[im 39/91  bone]
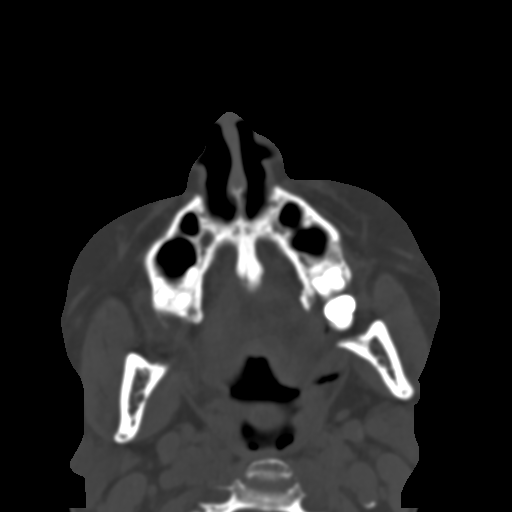
[im 52/91  bone]
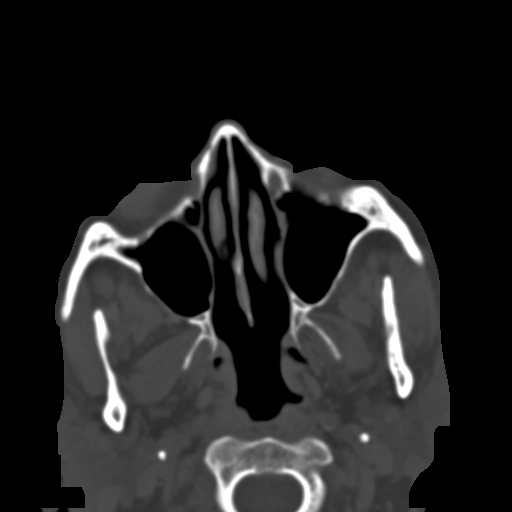
[im 58/91  bone]
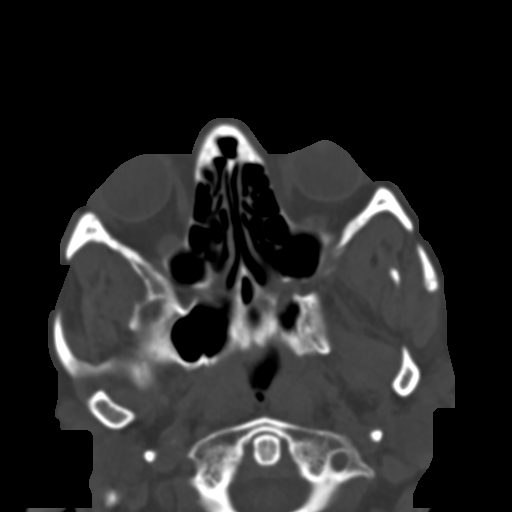
[im 65/91  bone]
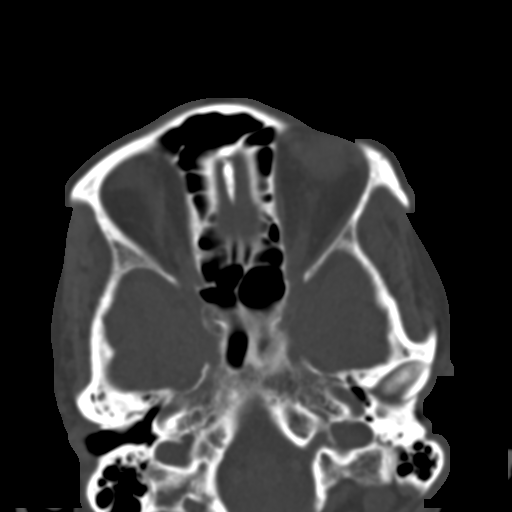
[im 78/91  brain]
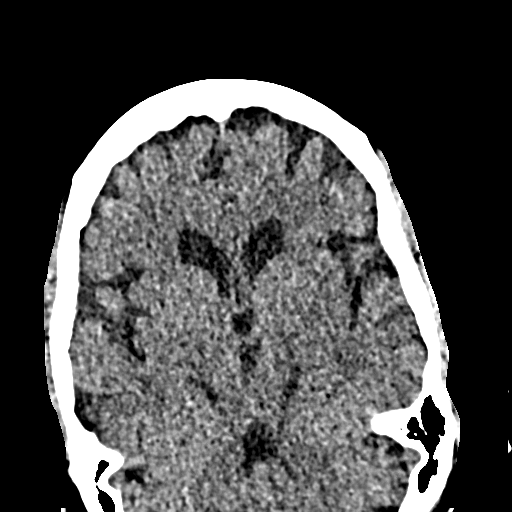
[im 78/91  bone]
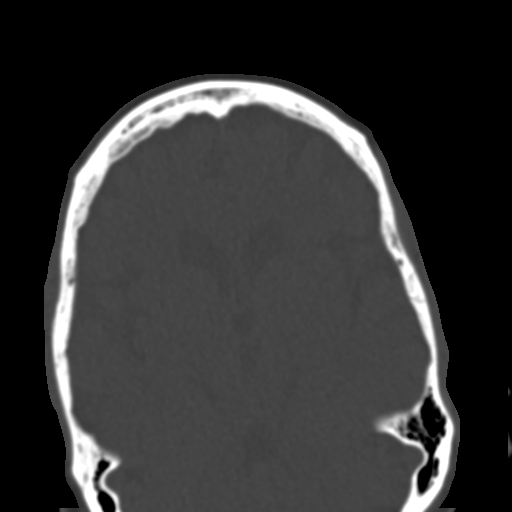
[im 84/91  bone]
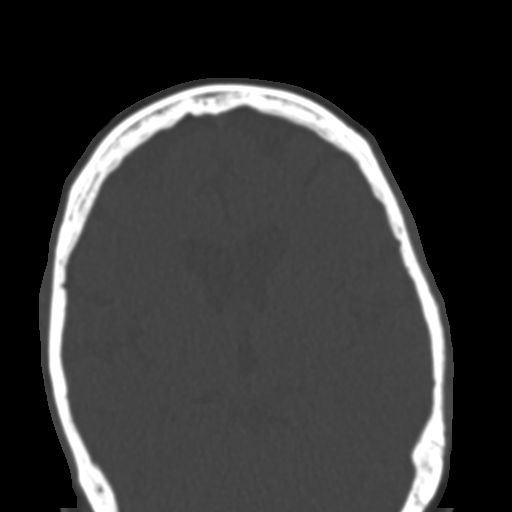

[Series 6: st cor · coronal · 0.35mm/px · 3 of 94 slices shown]
[im 24/94  bone]
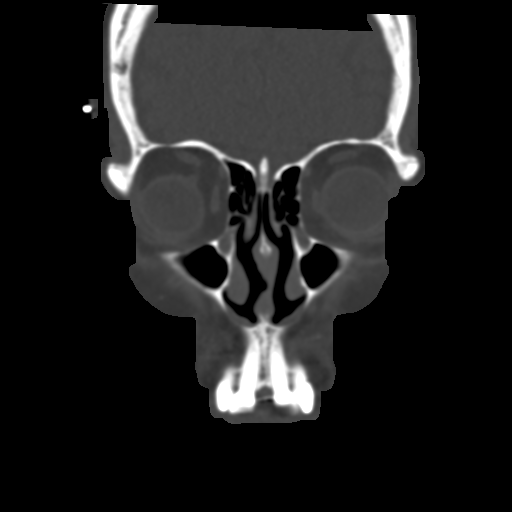
[im 47/94  bone]
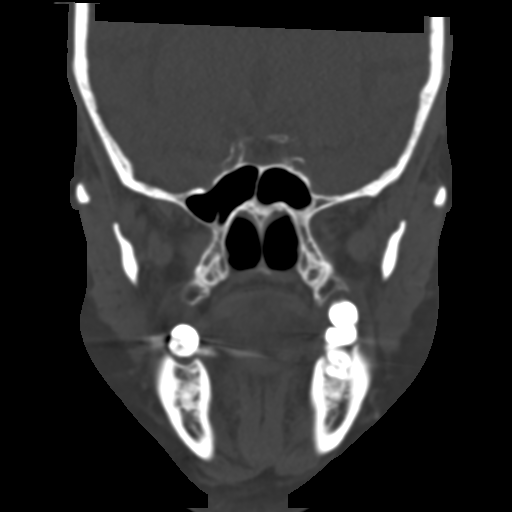
[im 70/94  bone]
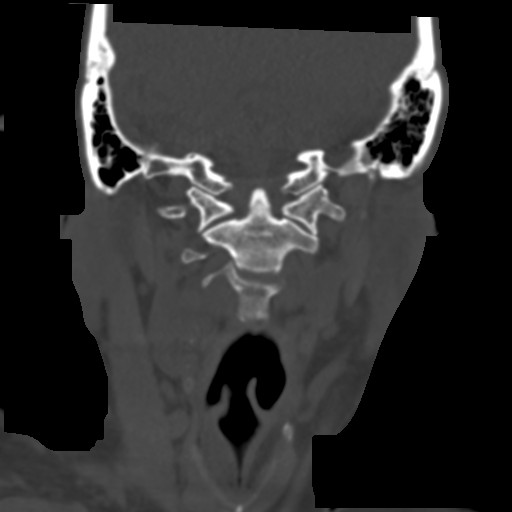

[Series 9: bone sag · sagittal · 0.37mm/px · 2 of 90 slices shown]
[im 30/90  bone]
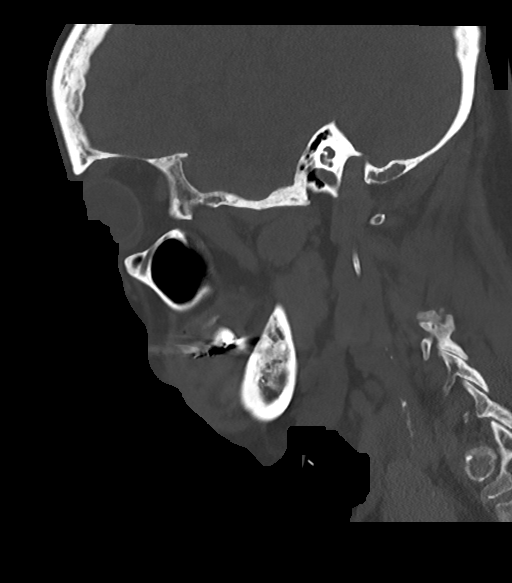
[im 60/90  bone]
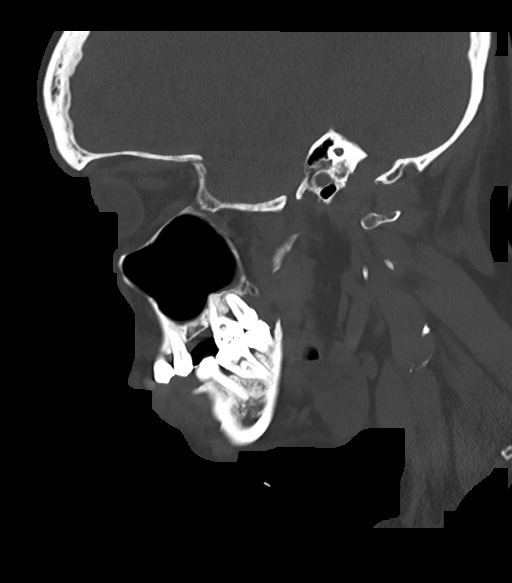

[15 of 47 positions shown; findings below may reference images not displayed]

FINDINGS: CT HEAD FINDINGS

Brain: No evidence of acute infarction, hemorrhage, hydrocephalus,
extra-axial collection or mass lesion/mass effect. Mild atrophic
changes are noted.

Vascular: No hyperdense vessel or unexpected calcification.

Skull: Normal. Negative for fracture or focal lesion.

Other: None.

CT MAXILLOFACIAL FINDINGS

Osseous: No fracture or mandibular dislocation. No destructive
process.

Orbits: Orbits and their contents are within normal limits.

Sinuses: Paranasal sinuses are within normal limits.

Soft tissues: Laceration is noted over the chin with a small amount
of subcutaneous air.

CT CERVICAL SPINE FINDINGS

Alignment: Straightening of the normal cervical lordosis is noted.

Skull base and vertebrae: 7 cervical segments are well visualized.
Vertebral body height is well maintained. Mild osteophytic changes
are seen. Facet hypertrophic changes are noted. No acute fracture or
acute facet abnormality is noted.

Soft tissues and spinal canal: Vascular calcifications are noted. No
acute soft tissue abnormality is seen.

Upper chest: Visualized lung apices are within normal limits.

Other: None
IMPRESSION: CT of the head: Chronic atrophic changes without acute abnormality.

CT of the maxillofacial bones: No acute bony abnormality is noted.

Soft tissue laceration is noted in the chin without significant
hematoma.

CT of the cervical spine: Degenerative changes without acute
abnormality.

## 2023-01-02 ENCOUNTER — Emergency Department: Payer: Medicaid Other

## 2023-01-02 ENCOUNTER — Emergency Department
Admission: EM | Admit: 2023-01-02 | Discharge: 2023-01-03 | Disposition: A | Discharge status: Home / Self Care | Payer: Medicaid Other | Attending: Emergency Medicine | Admitting: Emergency Medicine

## 2023-01-02 ENCOUNTER — Other Ambulatory Visit: Payer: Self-pay

## 2023-01-02 DIAGNOSIS — S300XXA Contusion of lower back and pelvis, initial encounter: Secondary | ICD-10-CM | POA: Insufficient documentation

## 2023-01-02 DIAGNOSIS — I10 Essential (primary) hypertension: Secondary | ICD-10-CM | POA: Diagnosis not present

## 2023-01-02 DIAGNOSIS — X58XXXA Exposure to other specified factors, initial encounter: Secondary | ICD-10-CM | POA: Diagnosis not present

## 2023-01-02 DIAGNOSIS — S3992XA Unspecified injury of lower back, initial encounter: Secondary | ICD-10-CM | POA: Diagnosis present

## 2023-01-02 LAB — BASIC METABOLIC PANEL
Anion gap: 16 — ABNORMAL HIGH (ref 5–15)
BUN: 12 mg/dL (ref 8–23)
CO2: 22 mmol/L (ref 22–32)
Calcium: 8.4 mg/dL — ABNORMAL LOW (ref 8.9–10.3)
Chloride: 95 mmol/L — ABNORMAL LOW (ref 98–111)
Creatinine, Ser: 1.19 mg/dL — ABNORMAL HIGH (ref 0.44–1.00)
GFR, Estimated: 52 mL/min — ABNORMAL LOW (ref >60)
Glucose, Bld: 82 mg/dL (ref 70–99)
Potassium: 3.5 mmol/L (ref 3.5–5.1)
Sodium: 133 mmol/L — ABNORMAL LOW (ref 135–145)

## 2023-01-02 LAB — CBC
HCT: 41.8 % (ref 36.0–46.0)
Hemoglobin: 14.1 g/dL (ref 12.0–15.0)
MCH: 32.6 pg (ref 26.0–34.0)
MCHC: 33.7 g/dL (ref 30.0–36.0)
MCV: 96.8 fL (ref 80.0–100.0)
Platelets: 143 10*3/uL — ABNORMAL LOW (ref 150–400)
RBC: 4.32 MIL/uL (ref 3.87–5.11)
RDW: 18 % — ABNORMAL HIGH (ref 11.5–15.5)
WBC: 4.7 10*3/uL (ref 4.0–10.5)
nRBC: 0 % (ref 0.0–0.2)

## 2023-01-02 MED ORDER — NAPROXEN 500 MG PO TABS: 500.0000 mg | ORAL_TABLET | Freq: Two times a day (BID) | ORAL | 0 refills | Status: DC

## 2023-01-02 MED ORDER — KETOROLAC TROMETHAMINE 15 MG/ML IJ SOLN
15.0000 mg | Freq: Once | INTRAMUSCULAR | Status: AC
  Administered 2023-01-02: 15 mg via INTRAVENOUS
  Filled 2023-01-02: qty 1

## 2023-01-02 NOTE — ED Notes (Signed)
X ray in room.

## 2023-01-02 NOTE — ED Provider Notes (Signed)
[facility] Provider Note    Event Date/Time   First MD Initiated Contact with Patient 01/02/23 2215     (approximate)   History   Tailbone Pain   HPI  Jamie Wilkins is a 62 y.o. female with a history of hypertension who comes ED complaining of pain in the region of the tailbone, upper gluteal cleft.  Pain started yesterday, has been constant throughout the day.  Hurts to sit and put pressure on the area.  No falls or trauma.  Denies sitting on hard chair.  No dysuria abdominal pain or constipation.     Physical Exam   Triage Vital Signs: ED Triage Vitals  Enc Vitals Group     BP 01/02/23 2234 (!) 150/84     Pulse Rate 01/02/23 2214 71     Resp 01/02/23 2214 17     Temp 01/02/23 2214 97.8 F (36.6 C)     Temp Source 01/02/23 2214 Oral     SpO2 01/02/23 2214 94 %     Weight 01/02/23 2216 130 lb (59 kg)     Height 01/02/23 2216 5' 6"$  (1.676 m)     Head Circumference --      Peak Flow --      Pain Score 01/02/23 2215 9     Pain Loc --      Pain Edu? --      Excl. in [place]? --     Most recent vital signs: Vitals:   01/02/23 2214 01/02/23 2234  BP:  (!) 150/84  Pulse: 71   Resp: 17   Temp: 97.8 F (36.6 C)   SpO2: 94%     General: Awake, no distress.  CV:  Good peripheral perfusion.  Resp:  Normal effort.  Abd:  No distention.  Soft nontender Other:  No inflammatory skin changes or wounds in the area of indicated pain at the superior gluteal cleft.  She does have focal tenderness on the area of the coccyx.  This reproduces the pain.   ED Results / Procedures / Treatments   Labs (all labs ordered are listed, but only abnormal results are displayed) Labs Reviewed  BASIC METABOLIC PANEL - Abnormal; Notable for the following components:      Result Value   Sodium 133 (*)    Chloride 95 (*)    Creatinine, Ser 1.19 (*)    Calcium 8.4 (*)    GFR, Estimated 52 (*)    Anion gap 16 (*)    All other components within normal limits   CBC - Abnormal; Notable for the following components:   RDW 18.0 (*)    Platelets 143 (*)    All other components within normal limits     RADIOLOGY X-ray pelvis interpreted by me, appears negative.  No fractures.  Radiology report reviewed.   PROCEDURES:  Procedures   MEDICATIONS ORDERED IN ED: Medications  ketorolac (TORADOL) 15 MG/ML injection 15 mg (15 mg Intravenous Given 01/02/23 2249)     IMPRESSION / MDM / ASSESSMENT AND PLAN / ED COURSE  I reviewed the triage vital signs and the nursing notes.                             Patient presents with sacral pain.  No open wounds.  No evidence of soft tissue infection.  X-ray negative for fracture.  Clinically she has a coccygeal contusion, possibly from forcefully sitting.  Denies  any trauma.  Can take NSAIDs for pain, stable for discharge     FINAL CLINICAL IMPRESSION(S) / ED DIAGNOSES   Final diagnoses:  Contusion of coccyx, initial encounter     Rx / DC Orders   ED Discharge Orders          Ordered    naproxen (NAPROSYN) 500 MG tablet  2 times daily with meals        01/02/23 2354             Note:  This document was prepared using Dragon voice recognition software and may include unintentional dictation errors.   Carrie Mew, MD 01/02/23 2358

## 2023-01-02 NOTE — ED Triage Notes (Signed)
Pt arrives from home via [facility]. C/O tailbone pain that started Sunday.  Denies injury.

## 2023-03-24 ENCOUNTER — Other Ambulatory Visit: Payer: Medicaid Other | Admitting: *Deleted

## 2023-03-24 NOTE — Patient Outreach (Signed)
Care Coordination  03/24/2023  [facility] Mar 20, 1961 [id number]   A detailed HIPAA compliant message was left. RNCM advised patient to contact her insurance plan and update PCP information.  Estanislado Emms RN, BSN [place]  Managed [facility] RN Care Coordinator [phone]

## 2024-01-01 ENCOUNTER — Emergency Department
Admission: EM | Admit: 2024-01-01 | Discharge: 2024-01-01 | Discharge status: Against Medical Advice | Payer: Medicare Other | Attending: Emergency Medicine | Admitting: Emergency Medicine

## 2024-01-01 ENCOUNTER — Telehealth: Payer: Self-pay | Admitting: Emergency Medicine

## 2024-01-01 ENCOUNTER — Other Ambulatory Visit: Payer: Self-pay

## 2024-01-01 ENCOUNTER — Emergency Department: Payer: Medicare Other

## 2024-01-01 DIAGNOSIS — R0602 Shortness of breath: Secondary | ICD-10-CM | POA: Insufficient documentation

## 2024-01-01 DIAGNOSIS — Z20822 Contact with and (suspected) exposure to covid-19: Secondary | ICD-10-CM | POA: Diagnosis not present

## 2024-01-01 DIAGNOSIS — Z5321 Procedure and treatment not carried out due to patient leaving prior to being seen by health care provider: Secondary | ICD-10-CM | POA: Diagnosis not present

## 2024-01-01 DIAGNOSIS — I10 Essential (primary) hypertension: Secondary | ICD-10-CM | POA: Diagnosis not present

## 2024-01-01 LAB — CBC
HCT: 38.9 % (ref 36.0–46.0)
Hemoglobin: 12.2 g/dL (ref 12.0–15.0)
MCH: 33.7 pg (ref 26.0–34.0)
MCHC: 31.4 g/dL (ref 30.0–36.0)
MCV: 107.5 fL — ABNORMAL HIGH (ref 80.0–100.0)
Platelets: 146 10*3/uL — ABNORMAL LOW (ref 150–400)
RBC: 3.62 MIL/uL — ABNORMAL LOW (ref 3.87–5.11)
RDW: 15.9 % — ABNORMAL HIGH (ref 11.5–15.5)
WBC: 4.3 10*3/uL (ref 4.0–10.5)
nRBC: 0 % (ref 0.0–0.2)

## 2024-01-01 LAB — BASIC METABOLIC PANEL
Anion gap: 12 (ref 5–15)
BUN: 18 mg/dL (ref 8–23)
CO2: 24 mmol/L (ref 22–32)
Calcium: 9.3 mg/dL (ref 8.9–10.3)
Chloride: 105 mmol/L (ref 98–111)
Creatinine, Ser: 1.07 mg/dL — ABNORMAL HIGH (ref 0.44–1.00)
GFR, Estimated: 58 mL/min — ABNORMAL LOW (ref >60)
Glucose, Bld: 94 mg/dL (ref 70–99)
Potassium: 4.1 mmol/L (ref 3.5–5.1)
Sodium: 141 mmol/L (ref 135–145)

## 2024-01-01 LAB — RESP PANEL BY RT-PCR (RSV, FLU A&B, COVID)  RVPGX2
Influenza A by PCR: NEGATIVE
Influenza B by PCR: NEGATIVE
Resp Syncytial Virus by PCR: NEGATIVE
SARS Coronavirus 2 by RT PCR: NEGATIVE

## 2024-01-01 LAB — TROPONIN I (HIGH SENSITIVITY): Troponin I (High Sensitivity): 22 ng/L — ABNORMAL HIGH (ref <18)

## 2024-01-01 NOTE — ED Notes (Signed)
 When about to take patient back to treatment room patient became irritated and states "why did you tell me to call my ride? They're on the way."  "I will just go see my doctor tomorrow, I don't want to be seen at this hospital, when I worked here that's why I didn't work here long because of the shitty communication."  Pt refusing to let specialty coordinator RN speak to clarify situation.  Pt ambulating out of wheelchair with steady gait and in NAD.

## 2024-01-01 NOTE — ED Notes (Addendum)
 Pt refusing to have blood redrawn at this time. Pt stated, "I am afraid of needles. Can I go ahead and call someone to come pick me up? I will just call my doctor." This tech informed pt that it was up to her if she left or not and that her PCP would be able to see results from this visit. Mady Schlichter, first RN made aware.

## 2024-01-01 NOTE — Telephone Encounter (Signed)
 Called patient due to left emergency department before provider exam to inquire about condition and follow up plans. Left message.

## 2024-01-01 NOTE — ED Triage Notes (Signed)
 Pt to ED via [facility] c/o SHOB x 5 days. Got worse today. Pt was in tripod position and wheezing upon EMS arrival. Was given 1 duoneb.100% ON 4l. Pt reports feeling better. Denies SHOB or CP at this time. Hx of HTN, takes meds as prescribed

## 2024-01-19 ENCOUNTER — Emergency Department
Admission: EM | Admit: 2024-01-19 | Discharge: 2024-01-19 | Disposition: A | Discharge status: Home / Self Care | Payer: Medicare Other | Attending: Emergency Medicine | Admitting: Emergency Medicine

## 2024-01-19 DIAGNOSIS — F1090 Alcohol use, unspecified, uncomplicated: Secondary | ICD-10-CM | POA: Diagnosis present

## 2024-01-19 DIAGNOSIS — R944 Abnormal results of kidney function studies: Secondary | ICD-10-CM | POA: Diagnosis not present

## 2024-01-19 DIAGNOSIS — Z79899 Other long term (current) drug therapy: Secondary | ICD-10-CM | POA: Insufficient documentation

## 2024-01-19 DIAGNOSIS — Y902 Blood alcohol level of 40-59 mg/100 ml: Secondary | ICD-10-CM | POA: Insufficient documentation

## 2024-01-19 DIAGNOSIS — N179 Acute kidney failure, unspecified: Secondary | ICD-10-CM

## 2024-01-19 DIAGNOSIS — Z789 Other specified health status: Secondary | ICD-10-CM

## 2024-01-19 LAB — CBC
HCT: 42.2 % (ref 36.0–46.0)
Hemoglobin: 14 g/dL (ref 12.0–15.0)
MCH: 33.9 pg (ref 26.0–34.0)
MCHC: 33.2 g/dL (ref 30.0–36.0)
MCV: 102.2 fL — ABNORMAL HIGH (ref 80.0–100.0)
Platelets: 245 10*3/uL (ref 150–400)
RBC: 4.13 MIL/uL (ref 3.87–5.11)
RDW: 15.1 % (ref 11.5–15.5)
WBC: 5 10*3/uL (ref 4.0–10.5)
nRBC: 0 % (ref 0.0–0.2)

## 2024-01-19 LAB — URINE DRUG SCREEN, QUALITATIVE (ARMC ONLY)
Amphetamines, Ur Screen: NOT DETECTED
Barbiturates, Ur Screen: NOT DETECTED
Benzodiazepine, Ur Scrn: NOT DETECTED
Cannabinoid 50 Ng, Ur ~~LOC~~: NOT DETECTED
Cocaine Metabolite,Ur ~~LOC~~: NOT DETECTED
MDMA (Ecstasy)Ur Screen: NOT DETECTED
Methadone Scn, Ur: NOT DETECTED
Opiate, Ur Screen: NOT DETECTED
Phencyclidine (PCP) Ur S: NOT DETECTED
Tricyclic, Ur Screen: NOT DETECTED

## 2024-01-19 LAB — COMPREHENSIVE METABOLIC PANEL
ALT: 14 U/L (ref 0–44)
AST: 28 U/L (ref 15–41)
Albumin: 4.3 g/dL (ref 3.5–5.0)
Alkaline Phosphatase: 83 U/L (ref 38–126)
Anion gap: 17 — ABNORMAL HIGH (ref 5–15)
BUN: 27 mg/dL — ABNORMAL HIGH (ref 8–23)
CO2: 21 mmol/L — ABNORMAL LOW (ref 22–32)
Calcium: 9.6 mg/dL (ref 8.9–10.3)
Chloride: 100 mmol/L (ref 98–111)
Creatinine, Ser: 1.29 mg/dL — ABNORMAL HIGH (ref 0.44–1.00)
GFR, Estimated: 47 mL/min — ABNORMAL LOW (ref >60)
Glucose, Bld: 68 mg/dL — ABNORMAL LOW (ref 70–99)
Potassium: 5.1 mmol/L (ref 3.5–5.1)
Sodium: 138 mmol/L (ref 135–145)
Total Bilirubin: 0.6 mg/dL (ref 0.0–1.2)
Total Protein: 8.3 g/dL — ABNORMAL HIGH (ref 6.5–8.1)

## 2024-01-19 LAB — BASIC METABOLIC PANEL
Anion gap: 13 (ref 5–15)
BUN: 29 mg/dL — ABNORMAL HIGH (ref 8–23)
CO2: 23 mmol/L (ref 22–32)
Calcium: 8.3 mg/dL — ABNORMAL LOW (ref 8.9–10.3)
Chloride: 102 mmol/L (ref 98–111)
Creatinine, Ser: 1.47 mg/dL — ABNORMAL HIGH (ref 0.44–1.00)
GFR, Estimated: 40 mL/min — ABNORMAL LOW (ref >60)
Glucose, Bld: 125 mg/dL — ABNORMAL HIGH (ref 70–99)
Potassium: 4.9 mmol/L (ref 3.5–5.1)
Sodium: 138 mmol/L (ref 135–145)

## 2024-01-19 LAB — ACETAMINOPHEN LEVEL: Acetaminophen (Tylenol), Serum: <10 ug/mL — ABNORMAL LOW (ref 10–30)

## 2024-01-19 LAB — ETHANOL: Alcohol, Ethyl (B): 49 mg/dL — ABNORMAL HIGH (ref <10)

## 2024-01-19 LAB — SALICYLATE LEVEL: Salicylate Lvl: <7 mg/dL — ABNORMAL LOW (ref 7.0–30.0)

## 2024-01-19 MED ORDER — SODIUM CHLORIDE 0.9 % IV BOLUS
1000.0000 mL | Freq: Once | INTRAVENOUS | Status: AC
  Administered 2024-01-19: 1000 mL via INTRAVENOUS

## 2024-01-19 NOTE — ED Triage Notes (Signed)
 Pt here with [facility] under IVC for etoh addiction and SI. Pt has not been using her disability to take care of herself and has instead been spending her money on alcohol. Pt has discussed SI and has access to guns. Pt admits she has an addiction but will not seek treatment on her own. Pt states she feels fine.

## 2024-01-19 NOTE — ED Notes (Signed)
 IVC pending consult  1800

## 2024-01-19 NOTE — ED Notes (Signed)
 TTS at bedside.

## 2024-01-19 NOTE — ED Provider Notes (Signed)
-----------------------------------------   6:11 PM on 01/19/2024 -----------------------------------------  Patient is a 63 year old female who I received in signout presenting today for alcohol abuse as well as possible SI.  Her vital signs on arrival showed tachycardia with laboratory workup showing slight anion gap acidosis which I suspect is related to dehydration and alcohol use.  Evaluated by psychiatry team and cleared.  Given her labs, will get 1 L of fluids and repeat BMP.  If this is reassuring then patient can be discharged.  Anion gap resolved with fluids.  Patient otherwise tolerating p.o.  Will discharge at this time with outpatient follow-up with her PCP.   Janith Lima, MD 01/19/24 [employer]s

## 2024-01-19 NOTE — Consult Note (Signed)
 [facility] Psychiatric Consult Initial  Patient Name: .Jamie Wilkins  MRN: [id number]  DOB: Jan 18, 1961  Consult Order details:  Orders (From admission, onward)     Start     Ordered   01/19/24 1230  CONSULT TO CALL ACT TEAM       Ordering Provider: Concha Se, MD  Provider:  (Not yet assigned)  Question:  Reason for Consult?  Answer:  Psych consult   01/19/24 1229   01/19/24 1230  IP CONSULT TO PSYCHIATRY       Ordering Provider: Concha Se, MD  Provider:  (Not yet assigned)  Question Answer Comment  Place call to: [phone]   Reason for Consult Admit      01/19/24 1229             Mode of Visit: Tele-visit Virtual Statement:TELE PSYCHIATRY ATTESTATION & CONSENT As the provider for this telehealth consult, I attest that I verified the patient's identity using two separate identifiers, introduced myself to the patient, provided my credentials, disclosed my location, and performed this encounter via a HIPAA-compliant, real-time, face-to-face, two-way, interactive audio and video platform and with the full consent and agreement of the patient (or guardian as applicable.) Patient physical location: [facility] ED. Telehealth provider physical location: home office in state of [place].   Video start time: 5 PM Video end time: 5:20 PM    Psychiatry Consult Evaluation  Service Date: January 19, 2024 LOS:  LOS: 0 days  Chief Complaint "The police pulled up to my house. My son was upset that I got behind on my bills"  Primary Psychiatric Diagnoses  Alcohol use   Assessment  Jamie Wilkins is a 63 y.o. female presenting to [facility] ED on 01/19/24 at 11:28 AM due to reports of suicidal ideation and alcohol use. Patient noted sitting in a chair, pleasant, calm and engaging. Patient alert and oriented x 4, clear and coherent in speech and thought and able to answer questions appropriately. Patient reports that her son is upset with her because he believes she drinks too much and she has  gotten behind on her rent. Patient reports that she is expected to received her disability income on the 2nd of the month to pay her bills. Patient is currently denying suicidal ideations, stating that she loves herself. Patient denies HI/AVH, paranoia or delusional thought. She reports living independently with her dog and having a multiple friends that are her neighbors. Patient reports that she realizes she drinks Vodka often but not daily. She denies tremors or seizures due to alcohol cessation in the past. Patient has a psychiatric history of MDD and is prescribed Remeron 30 mg at bedtime by her PCP. Patient diagnosed with MDD in 2022 during inpatient psychiatric stay at [facility]. She reports that she is compliant with her medication and believes it to be effective with managing her depression. She reports that she cares for her son and his concerns and plans to follow up with her PCP to get established with substance abuse treatment again. Patient reports using crack cocaine 20 years ago and admitting to rehab, prior to stopping crack cocaine use. She reports that she has not used any illicit substances in 20 years.   She reports a good mood, sleep and appetite.   TTS completed collateral contact with patient's son and the family appears to more concerned with patient's alcohol use. Per TTS, recommendations of family becoming the patient's payee was recommended.   Patient denies a history of  SIB or Suicide attempt. Patient does not meet inpatient criteria. ED treatment team updated on patient's status.    Diagnoses:  Active Hospital problems: Alcohol use  Plan   ## Psychiatric Medication Recommendations:  Continue current medication regimen  ## Medical Decision Making Capacity: Not specifically addressed in this encounter  ## Further Work-up:  -- defer to EDP  -- most recent EKG on 01/01/24 had QtC of 454 -- Pertinent labwork reviewed earlier this admission includes: CBC, CMP, UDS,  Ethyl alcohol level 49   ## Disposition:-- There are no psychiatric contraindications to discharge at this time  ## Behavioral / Environmental: - No specific recommendations at this time.     ## Safety and Observation Level:  - Based on my clinical evaluation, I estimate the patient to be at low risk of self harm in the current setting. - At this time, we recommend  routine. This decision is based on my review of the chart including patient's history and current presentation, interview of the patient, mental status examination, and consideration of suicide risk including evaluating suicidal ideation, plan, intent, suicidal or self-harm behaviors, risk factors, and protective factors. This judgment is based on our ability to directly address suicide risk, implement suicide prevention strategies, and develop a safety plan while the patient is in the clinical setting. Please contact our team if there is a concern that risk level has changed.  CSSR Risk Category:C-SSRS RISK CATEGORY: No Risk  Suicide Risk Assessment: Patient has following modifiable risk factors for suicide: recklessness, which we are addressing by recommendation to follow up with her outpatient provider for substance abuse treatment or AA. Patient has following non-modifiable or demographic risk factors for suicide: psychiatric hospitalization Patient has the following protective factors against suicide: Supportive family  Thank you for this consult request. Recommendations have been communicated to the primary team.  We will psychiatrically clear patient  at this time.   Mcneil Sober, NP       History of Present Illness  Relevant Aspects of Hospital ED Course:  Admitted on 01/19/2024 for alcohol use.  Patient Report:  "My son is upset with me"  Psych ROS:  Depression: denies, although record indicates MDD Anxiety:  denies Mania (lifetime and current): denies Psychosis: (lifetime and current): denies, although record  indicates delusion  Collateral information:  TTS Contacted patient's son.   Review of Systems  Psychiatric/Behavioral:  Positive for substance abuse. Negative for depression, hallucinations and suicidal ideas. The patient is not nervous/anxious and does not have insomnia.   All other systems reviewed and are negative.    Psychiatric and Social History  Psychiatric History:  Information collected from patient, ED treatment team  Prev Dx/Sx: MDD, delusion Current Psych Provider: denies Home Meds (current): mirtazapine 30 mg prescribed by PCP Previous Med Trials: denies Therapy: denies  Prior Psych Hospitalization: denies  Prior Self Harm: denies Prior Violence: denies  Family Psych History: Patient reports that both her mother and father, as well as their siblings were alcoholics. Paternal aunt had a inpatient psychiatric hospitalization Family Hx suicide: denies  Social History:  Developmental Hx: normal Educational Hx: high school graduate Occupational Hx: retired. SSDI Legal Hx: denies Living Situation: lives alone possible eviction upcoming due to nonpayment of rent Spiritual Hx: [employer] Access to weapons/lethal means: yes. Gun    Substance History Alcohol: yes  Type of alcohol Vodka Last Drink yesterday Number of drinks per day 1 History of alcohol withdrawal seizures denies History of DT's denies Tobacco: 1 pack every  3 days of cigarettes Illicit drugs: denies Prescription drug abuse: denies Rehab hx: yes. Hx of crack cocaine use, 20 years ago. Stayed in rehab for 3 days prior to crack cocaine cessation  Exam Findings  Physical Exam: No abnormal movements observed Vital Signs:  Temp:  [98.7 F (37.1 C)] 98.7 F (37.1 C) (02/28 1115) Pulse Rate:  [101] 101 (02/28 1115) Resp:  [17] 17 (02/28 1115) BP: (162)/(130) 162/130 (02/28 1115) SpO2:  [95 %] 95 % (02/28 1115) Weight:  [72.6 kg] 72.6 kg (02/28 1115) Blood pressure (!) 162/130, pulse (!) 101,  temperature 98.7 F (37.1 C), temperature source Oral, resp. rate 17, height 5\' 7"  (1.702 m), weight 72.6 kg, last menstrual period 05/11/2010, SpO2 95%. Body mass index is 25.07 kg/m.  Physical Exam Vitals and nursing note reviewed.  Constitutional:      Appearance: Normal appearance.  Neurological:     General: No focal deficit present.     Mental Status: She is alert and oriented to person, place, and time.  Psychiatric:        Behavior: Behavior normal.     Mental Status Exam: General Appearance: Disheveled  Orientation:  Full (Time, Place, and Person)  Memory:  Immediate;   Good Recent;   Good Remote;   Good  Concentration:  Concentration: Good and Attention Span: Good  Recall:  Good  Attention  Good  Eye Contact:  Good  Speech:  Clear and Coherent  Language:  Good  Volume:  Normal  Mood: good"  Affect:  Congruent  Thought Process:  Coherent  Thought Content:  Logical  Suicidal Thoughts:  No  Homicidal Thoughts:  No  Judgement:  Good  Insight:  Good  Psychomotor Activity:  Normal  Akathisia:  No  Fund of Knowledge:  Good      Assets:  Communication Skills  Cognition:  WNL  ADL's:  Intact  AIMS (if indicated):        Other History   These have been pulled in through the EMR, reviewed, and updated if appropriate.  Family History:  The patient's family history includes CAD in her brother, brother, and father.  Medical History: Past Medical History:  Diagnosis Date   Asthma    Heavy menstrual period    Hypertension     Surgical History: Past Surgical History:  Procedure Laterality Date   ABDOMINAL HYSTERECTOMY     partial   COLONOSCOPY N/A 06/12/2021   Procedure: COLONOSCOPY;  Surgeon: Toney Reil, MD;  Location: [facility] ENDOSCOPY;  Service: Gastroenterology;  Laterality: N/A;   ESOPHAGOGASTRODUODENOSCOPY N/A 06/10/2021   Procedure: ESOPHAGOGASTRODUODENOSCOPY (EGD);  Surgeon: Toledo, Boykin Nearing, MD;  Location: [facility] ENDOSCOPY;  Service:  Gastroenterology;  Laterality: N/A;   FOOT SURGERY     GIVENS CAPSULE STUDY N/A 06/12/2021   Procedure: GIVENS CAPSULE STUDY;  Surgeon: Toney Reil, MD;  Location: [facility] ENDOSCOPY;  Service: Gastroenterology;  Laterality: N/A;   S/P BTL       Medications:  No current facility-administered medications for this encounter.  Current Outpatient Medications:    acetaminophen (TYLENOL) 325 MG tablet, Take 2 tablets (650 mg total) by mouth every 6 (six) hours as needed for mild pain, fever or headache., Disp: 1 tablet, Rfl: 0   albuterol (VENTOLIN HFA) 108 (90 Base) MCG/ACT inhaler, Inhale 1-2 puffs into the lungs every 6 (six) hours as needed for wheezing or shortness of breath., Disp: 6.7 g, Rfl: 0   albuterol (VENTOLIN HFA) 108 (90 Base) MCG/ACT inhaler, Inhale 1-2  puffs into the lungs once every 6 hours as needed for wheezing or shortness of breath., Disp: 6.7 g, Rfl: 0   ferrous sulfate (FERROUSUL) 325 (65 FE) MG tablet, Take 1 tablet (325 mg total) by mouth every other day., Disp: 60 tablet, Rfl: 1   hydrocortisone cream 1 %, Apply topically as needed for itching., Disp: 30 g, Rfl: 0   hydrocortisone cream 1 %, Apply one application topically to the affected area(s) daily as needed for itching., Disp: 30 g, Rfl: 0   lidocaine (LMX) 4 % cream, Apply topically 3 (three) times daily as needed (foot pain)., Disp: 30 g, Rfl: 0   midodrine (PROAMATINE) 10 MG tablet, Take 1 tablet (10 mg total) by mouth 3 (three) times daily with meals., Disp: 90 tablet, Rfl: 0   midodrine (PROAMATINE) 5 MG tablet, TAKE 2 TABLETS (10MG  TOTAL) BY MOUTH 3 TIMES DAILY WITH MEALS., Disp: 180 tablet, Rfl: 0   mirtazapine (REMERON) 30 MG tablet, Take 1 tablet (30 mg total) by mouth at bedtime., Disp: 30 tablet, Rfl: 0   mirtazapine (REMERON) 30 MG tablet, Take 1 tablet (30 mg total) by mouth once daily at bedtime., Disp: 30 tablet, Rfl: 0   Multiple Vitamins-Minerals (THERA-M) TABS, Take 1 tablet by mouth daily., Disp:  , Rfl:    naproxen (NAPROSYN) 500 MG tablet, Take 1 tablet (500 mg total) by mouth 2 (two) times daily with a meal., Disp: 20 tablet, Rfl: 0   nicotine (NICODERM CQ - DOSED IN MG/24 HOURS) 14 mg/24hr patch, Place 1 patch (14 mg total) onto the skin daily., Disp: 28 patch, Rfl: 0   thiamine 100 MG tablet, Take 1 tablet by mouth daily., Disp: , Rfl:    traMADol (ULTRAM) 50 MG tablet, Take 1 tablet (50 mg total) by mouth every 6 (six) hours as needed for moderate pain or severe pain., Disp: 20 tablet, Rfl: 0   Zinc Oxide (TRIPLE PASTE) 12.8 % ointment, Apply topically as needed for irritation., Disp: 56.7 g, Rfl: 0  Allergies: No Known Allergies  Mcneil Sober, NP

## 2024-01-19 NOTE — BH Assessment (Signed)
 Comprehensive Clinical Assessment (CCA) Note  01/19/2024 Jamie Wilkins [id number]  Jamie Wilkins is a 63 year old female who presents to [facility] ED after being IVC'd by her adult children for excessive alcohol use. Patient denied SI/HI/AVH. She denied sxs of depression, anxiety, and psychosis. She shared that her appetite and sleep are "good". Patient was alert and oriented x5 while clear in speech and coherent in her thought processes. She was very adamant that she did not have an alcohol addiction and was not interested in seeking residential treatment for her alcohol use. When TTS asked if she was interested in a 28-day program she quickly declined and stated that she did not have anyone who could care for her dog. Patient would benefit from [facility] and/or outpatient mental health/substance use treatment. This information was relayed to patient's son.  Collateral information obtained from patient's son Jamie Wilkins: [phone]): He shared that he is concerned with his mother's excessive drinking behavior. Her drinking behavior has caused her to become delinquent on her mortgage. He shared that he is in the process of obtaining payee rights to patient's disability checks to help her better manager her income and bills.   Chief Complaint: No chief complaint on file.  Visit Diagnosis: Alcohol Use Disorder, Severe    CCA Screening, Triage and Referral (STR)  Patient Reported Information How did you hear about [place]? Family/Friend  Referral name: No data recorded Referral phone number: No data recorded  Whom do you see for routine medical problems? No data recorded Practice/Facility Name: No data recorded Practice/Facility Phone Number: No data recorded Name of Contact: No data recorded Contact Number: No data recorded Contact Fax Number: No data recorded Prescriber Name: No data recorded Prescriber Address (if known): No data recorded  What Is the Reason for Your Visit/Call Today?  Alcohol Use Disorder  How Long Has This Been Causing You Problems? > than 6 months  What Do You Feel Would Help You the Most Today? No data recorded  Have You Recently Been in Any Inpatient Treatment (Hospital/Detox/Crisis Center/28-Day Program)? No data recorded Name/Location of Program/Hospital:No data recorded How Long Were You There? No data recorded When Were You Discharged? No data recorded  Have You Ever Received Services From [facility] Before? No data recorded Who Do You See at [facility]? No data recorded  Have You Recently Had Any Thoughts About Hurting Yourself? No  Are You Planning to Commit Suicide/Harm Yourself At This time? No   Have you Recently Had Thoughts About Hurting Someone Jamie Wilkins? No  Explanation: No data recorded  Have You Used Any Alcohol or Drugs in the Past 24 Hours? Yes  How Long Ago Did You Use Drugs or Alcohol? No data recorded What Did You Use and How Much? Alcohol - "a fifth of liquor"   Do You Currently Have a Therapist/Psychiatrist? No  Name of Therapist/Psychiatrist: No data recorded  Have You Been Recently Discharged From Any Office Practice or Programs? No  Explanation of Discharge From Practice/Program: No data recorded    CCA Screening Triage Referral Assessment Type of Contact: Face-to-Face  Is this Initial or Reassessment? No data recorded Date Telepsych consult ordered in [facility]:  No data recorded Time Telepsych consult ordered in [facility]:  No data recorded  Patient Reported Information Reviewed? No data recorded Patient Left Without Being Seen? No data recorded Reason for Not Completing Assessment: No data recorded  Collateral Involvement: Jamie Wilkins -[phone]   Does Patient Have a Court Appointed Legal Guardian? No data recorded Name  and Contact of Legal Guardian: No data recorded If Minor and Not Living with Parent(s), Who has Custody? No data recorded Is CPS involved or ever been involved? Never  Is APS involved or  ever been involved? In the past   Patient Determined To Be At Risk for Harm To Self or Others Based on Review of Patient Reported Information or Presenting Complaint? No  Method: No data recorded Availability of Means: No data recorded Intent: No data recorded Notification Required: No data recorded Additional Information for Danger to Others Potential: No data recorded Additional Comments for Danger to Others Potential: No data recorded Are There Guns or Other Weapons in Your Home? Yes  Types of Guns/Weapons: Gun - pt reports is locked away  Are These Weapons Safely Secured?                            Yes  Who Could Verify You Are Able To Have These Secured: [facility]  Do You Have any Outstanding Charges, Pending Court Dates, Parole/Probation? None Reported  Contacted To Inform of Risk of Harm To Self or Others: No data recorded  Location of Assessment: [facility] ED   Does Patient Present under Involuntary Commitment? Yes  IVC Papers Initial File Date: No data recorded  [place] of Residence: [place]   Patient Currently Receiving the Following Services: Not Receiving Services   Determination of Need: Emergent (2 hours)   Options For Referral: ALF/SNF; Therapeutic Triage Services     CCA Biopsychosocial Intake/Chief Complaint:  No data recorded Current Symptoms/Problems: No data recorded  Patient Reported Schizophrenia/Schizoaffective Diagnosis in Past: No   Strengths: Pt has the ability to communicate her needs.  Preferences: No data recorded Abilities: No data recorded  Type of Services Patient Feels are Needed: No data recorded  Initial Clinical Notes/Concerns: No data recorded  Mental Health Symptoms Depression:  None (Denied)   Duration of Depressive symptoms: No data recorded  Mania:  None   Anxiety:   None (Denied)   Psychosis:  None   Duration of Psychotic symptoms: No data recorded  Trauma:  None   Obsessions:  N/A   Compulsions:  N/A    Inattention:  None   Hyperactivity/Impulsivity:  None   Oppositional/Defiant Behaviors:  None   Emotional Irregularity:  None   Other Mood/Personality Symptoms:  No data recorded   Mental Status Exam Appearance and self-care  Stature:  Average   Weight:  Average weight   Clothing:  -- [facility] gown)   Grooming:  Neglected   Cosmetic use:  None   Posture/gait:  Normal   Motor activity:  Not Remarkable   Sensorium  Attention:  Normal   Concentration:  Normal   Orientation:  X5   Recall/memory:  Normal   Affect and Mood  Affect:  Labile   Mood:  Irritable   Relating  Eye contact:  Normal   Facial expression:  Responsive   Attitude toward examiner:  Cooperative   Thought and Language  Speech flow: Clear and Coherent   Thought content:  Appropriate to Mood and Circumstances   Preoccupation:  None   Hallucinations:  None   Organization:  No data recorded  [employer] of Knowledge:  Average   Intelligence:  Average   Abstraction:  Normal   Judgement:  Fair   Reality Testing:  Variable   Insight:  Fair   Decision Making:  Impulsive   Social Functioning  Social Maturity:  Irresponsible; Impulsive   Social Judgement:  Heedless   Stress  Stressors:  Family conflict; Housing   Coping Ability:  Deficient supports   Skill Deficits:  Activities of daily living; Decision making; Self-care; Responsibility   Supports:  Family; Support needed; Friends/Service system     Religion: Religion/Spirituality Are You A Religious Person?: No  Leisure/Recreation: Leisure / Recreation Do You Have Hobbies?: No  Exercise/Diet: Exercise/Diet Do You Exercise?: No Have You Gained or Lost A Significant Amount of Weight in the Past Six Months?: No Do You Follow a Special Diet?: No Do You Have Any Trouble Sleeping?: No   CCA Employment/Education Employment/Work Situation: Employment / Work Systems developer: On  disability Has Patient ever Been in Equities trader?: No  Education: Education Is Patient Currently Attending School?: No Did Theme park manager?: No Did You Have An Individualized Education Program (IIEP): No Did You Have Any Difficulty At [employer]?: No Patient's Education Has Been Impacted by Current Illness: No   CCA Family/Childhood History Family and Relationship History: Family history Does patient have children?: Yes How many children?: 3 How is patient's relationship with their children?: Pt reported that one of her children is in [place] and comes to help her frequently  Childhood History:  Childhood History Did patient suffer any verbal/emotional/physical/sexual abuse as a child?: No Did patient suffer from severe childhood neglect?: No Has patient ever been sexually abused/assaulted/raped as an adolescent or adult?: No Was the patient ever a victim of a crime or a disaster?: No Witnessed domestic violence?: No Has patient been affected by domestic violence as an adult?: No  Child/Adolescent Assessment: N/A     CCA Substance Use Alcohol/Drug Use: Alcohol / Drug Use Pain Medications: See MAR Prescriptions: See MAR Over the Counter: See MAR History of alcohol / drug use?: Yes Longest period of sobriety (when/how long): 2-3 weeks Negative Consequences of Use: Personal relationships, Financial Withdrawal Symptoms: None Substance #1 Name of Substance 1: Alcohol 1 - Age of First Use: Unable to Quantify 1 - Amount (size/oz): "a fifth of vodka" 1 - Frequency: Daily 1 - Duration: Years 1 - Last Use / Amount: 01/18/2024 1 - Method of Aquiring: Disability 1- Route of Use: Oral      ASAM's:  Six Dimensions of Multidimensional Assessment  Dimension 1:  Acute Intoxication and/or Withdrawal Potential:   Dimension 1:  Description of individual's past and current experiences of substance use and withdrawal: Pt has a hx of alcohol abuse  Dimension 2:  Biomedical  Conditions and Complications:   Dimension 2:  Description of patient's biomedical conditions and  complications: HTN  Dimension 3:  Emotional, Behavioral, or Cognitive Conditions and Complications:  Dimension 3:  Description of emotional, behavioral, or cognitive conditions and complications: Untreated MH  Dimension 4:  Readiness to Change:  Dimension 4:  Description of Readiness to Change criteria: Pre-contemplation  Dimension 5:  Relapse, Continued use, or Continued Problem Potential:  Dimension 5:  Relapse, continued use, or continued problem potential critiera description: High risk for relapse  Dimension 6:  Recovery/Living Environment:  Dimension 6:  Recovery/Iiving environment criteria description: Not supportive of recovery  ASAM Severity Score: ASAM's Severity Rating Score: 17  ASAM Recommended Level of Treatment: ASAM Recommended Level of Treatment: Level III Residential Treatment   Substance use Disorder (SUD) Substance Use Disorder (SUD)  Checklist Symptoms of Substance Use: Evidence of tolerance, Continued use despite persistent or recurrent social, interpersonal problems, caused or exacerbated by use, Continued use despite having a persistent/recurrent  physical/psychological problem caused/exacerbated by use, Persistent desire or unsuccessful efforts to cut down or control use, Large amounts of time spent to obtain, use or recover from the substance(s), Presence of craving or strong urge to use, Recurrent use that results in a failure to fulfill major role obligations (work, school, home)  Recommendations for Services/Supports/Treatments: Recommendations for Services/Supports/Treatments Recommendations For Services/Supports/Treatments: Residential-Level 1, SAIOP (Substance Abuse Intensive Outpatient Program), Medication Management  DSM5 Diagnoses: Patient Active Problem List   Diagnosis Date Noted   Alcohol use 01/19/2024   Delusions (HCC) 09/16/2021   Depression due to physical  illness 08/31/2021   Decubitus ulcer 08/31/2021   Severe major depression, single episode, without psychotic features (HCC) 08/31/2021   Hypotension 08/30/2021   PAC (premature atrial contraction)    Anemia    Atrial fibrillation with RVR (HCC) 07/20/2021   Colitis 06/12/2021   Melena    AF (paroxysmal atrial fibrillation) (HCC) 06/10/2021   Protein-calorie malnutrition, severe 06/10/2021   Hyponatremia 06/09/2021   Symptomatic anemia 06/09/2021   Malnutrition of moderate degree 02/17/2021   Syncope    Elevated lipase    Electrolyte abnormality    Chest pain 02/15/2021   Hypocalcemia    Hypokalemia    Hypomagnesemia    Alcoholic hepatitis without ascites      Cleon Dew, Counselor

## 2024-01-19 NOTE — Discharge Instructions (Signed)
 Please follow-up with your primary care provider in 1 week for reassessment and recheck of your labs.  Please continue alcohol cessation and increase fluid intake with water to help with your kidney function.

## 2024-01-19 NOTE — ED Notes (Signed)
 MD rescinded pt IVC, RN called [employer] company and scheduled taxi transport home, pt provided with a cab voucher, pt belongings returned and pt dressed self in bathroom.  Pt brought to waiting room by RN.

## 2024-01-19 NOTE — ED Notes (Addendum)
 Pt belongings:  1 pair of black crocs 1 pair of pink socks 1 pair of black pants 1 blue shirt 1 red jacket 1 black bra 1 black purse with cigarettes, ID, and a lighter 1 black hat 2 sets of gold hoops and 1 set of pearl earrings 2 rings

## 2024-01-19 NOTE — ED Notes (Signed)
 Pt talking to NP via computer

## 2024-01-19 NOTE — ED Notes (Signed)
 Pt sitting on the side of the bed, wants to leave, pt waiting for psych team at this time.

## 2024-01-19 NOTE — ED Provider Notes (Signed)
 [facility] Provider Note    Event Date/Time   First MD Initiated Contact with Patient 01/19/24 1214     (approximate)   History   No chief complaint on file.   HPI  Jamie Wilkins is a 63 y.o. female with history of EtOH abuse who comes in with concern for IVC.  Patient reports being placed under IVC.  According to paperwork patient's been spending her money on alcohol and there was concern for SI.  Patient still denies any SI.  She denies any history of alcohol withdrawal.  She states she does not drink every day.    Physical Exam   Triage Vital Signs: ED Triage Vitals [01/19/24 1115]  Encounter Vitals Group     BP (!) 162/130     Systolic BP Percentile      Diastolic BP Percentile      Pulse Rate (!) 101     Resp 17     Temp 98.7 F (37.1 C)     Temp Source Oral     SpO2 95 %     Weight 160 lb 0.9 oz (72.6 kg)     Height 5\' 7"  (1.702 m)     Head Circumference      Peak Flow      Pain Score 4     Pain Loc      Pain Education      Exclude from Growth Chart     Most recent vital signs: Vitals:   01/19/24 1115  BP: (!) 162/130  Pulse: (!) 101  Resp: 17  Temp: 98.7 F (37.1 C)  SpO2: 95%     General: Awake, no distress.  CV:  Good peripheral perfusion.  Resp:  Normal effort.  Abd:  No distention.  Other:  No SI   ED Results / Procedures / Treatments   Labs (all labs ordered are listed, but only abnormal results are displayed) Labs Reviewed  COMPREHENSIVE METABOLIC PANEL - Abnormal; Notable for the following components:      Result Value   CO2 21 (*)    Glucose, Bld 68 (*)    BUN 27 (*)    Creatinine, Ser 1.29 (*)    Total Protein 8.3 (*)    GFR, Estimated 47 (*)    Anion gap 17 (*)    All other components within normal limits  ETHANOL - Abnormal; Notable for the following components:   Alcohol, Ethyl (B) 49 (*)    All other components within normal limits  SALICYLATE LEVEL - Abnormal; Notable for the following  components:   Salicylate Lvl <7.0 (*)    All other components within normal limits  ACETAMINOPHEN LEVEL - Abnormal; Notable for the following components:   Acetaminophen (Tylenol), Serum <10 (*)    All other components within normal limits  CBC - Abnormal; Notable for the following components:   MCV 102.2 (*)    All other components within normal limits  URINE DRUG SCREEN, QUALITATIVE ([facility] ONLY)    PROCEDURES:  Critical Care performed: No  Procedures   MEDICATIONS ORDERED IN ED: Medications - No data to display   IMPRESSION / MDM / ASSESSMENT AND PLAN / ED COURSE  I reviewed the triage vital signs and the nursing notes.   Patient's presentation is most consistent with acute presentation with potential threat to life or bodily function.   Pt is without any acute medical complaints. No exam findings to suggest medical cause of current presentation. Will  order psychiatric screening labs and discuss further w/ psychiatric service.  D/d includes but is not limited to psychiatric disease, behavioral/personality disorder, inadequate socioeconomic support, medical.  Based on HPI, exam, unremarkable labs, no concern for acute medical problem at this time. No rigidity, clonus, hyperthermia, focal neurologic deficit, diaphoresis, tachycardia, meningismus, ataxia, gait abnormality or other finding to suggest this visit represents a non-psychiatric problem. Screening labs reviewed.    Given this, pt medically cleared, to be dispositioned per Psych.    The patient has been placed in psychiatric observation due to the need to provide a safe environment for the patient while obtaining psychiatric consultation and evaluation, as well as ongoing medical and medication management to treat the patient's condition.  The patient has been placed under full IVC at this time.    CBC is reassuring.  CMP shows creatinine is slightly elevated but pretty similar to baselines.  Alcohol was slightly  elevated at 49 salicylate, Tylenol negative.  Patient is to be medically cleared to be dispositioned by psych  The patient is on the cardiac monitor to evaluate for evidence of arrhythmia and/or significant heart rate changes.      FINAL CLINICAL IMPRESSION(S) / ED DIAGNOSES   Final diagnoses:  Alcohol use     Rx / DC Orders   ED Discharge Orders     None        Note:  This document was prepared using Dragon voice recognition software and may include unintentional dictation errors.   Concha Se, MD 01/19/24 [phone]

## 2024-07-03 ENCOUNTER — Other Ambulatory Visit: Payer: Self-pay

## 2024-07-03 ENCOUNTER — Emergency Department

## 2024-07-03 ENCOUNTER — Emergency Department
Admission: EM | Admit: 2024-07-03 | Discharge: 2024-07-03 | Disposition: A | Discharge status: Home / Self Care | Attending: Emergency Medicine | Admitting: Emergency Medicine

## 2024-07-03 DIAGNOSIS — M25552 Pain in left hip: Secondary | ICD-10-CM | POA: Insufficient documentation

## 2024-07-03 DIAGNOSIS — R319 Hematuria, unspecified: Secondary | ICD-10-CM | POA: Insufficient documentation

## 2024-07-03 DIAGNOSIS — N390 Urinary tract infection, site not specified: Secondary | ICD-10-CM | POA: Insufficient documentation

## 2024-07-03 DIAGNOSIS — J45909 Unspecified asthma, uncomplicated: Secondary | ICD-10-CM | POA: Diagnosis not present

## 2024-07-03 DIAGNOSIS — I10 Essential (primary) hypertension: Secondary | ICD-10-CM | POA: Diagnosis not present

## 2024-07-03 LAB — URINALYSIS, ROUTINE W REFLEX MICROSCOPIC
Bilirubin Urine: NEGATIVE
Glucose, UA: NEGATIVE mg/dL
Ketones, ur: 5 mg/dL — AB
Nitrite: POSITIVE — AB
Protein, ur: 100 mg/dL — AB
Specific Gravity, Urine: 1.012 (ref 1.005–1.030)
WBC, UA: >50 WBC/hpf (ref 0–5)
pH: 6 (ref 5.0–8.0)

## 2024-07-03 LAB — CBC
HCT: 33.8 % — ABNORMAL LOW (ref 36.0–46.0)
Hemoglobin: 11.4 g/dL — ABNORMAL LOW (ref 12.0–15.0)
MCH: 34.3 pg — ABNORMAL HIGH (ref 26.0–34.0)
MCHC: 33.7 g/dL (ref 30.0–36.0)
MCV: 101.8 fL — ABNORMAL HIGH (ref 80.0–100.0)
Platelets: 154 K/uL (ref 150–400)
RBC: 3.32 MIL/uL — ABNORMAL LOW (ref 3.87–5.11)
RDW: 16.3 % — ABNORMAL HIGH (ref 11.5–15.5)
WBC: 2.9 K/uL — ABNORMAL LOW (ref 4.0–10.5)
nRBC: 0.7 % — ABNORMAL HIGH (ref 0.0–0.2)

## 2024-07-03 LAB — COMPREHENSIVE METABOLIC PANEL WITH GFR
ALT: 30 U/L (ref 0–44)
AST: 79 U/L — ABNORMAL HIGH (ref 15–41)
Albumin: 3.6 g/dL (ref 3.5–5.0)
Alkaline Phosphatase: 104 U/L (ref 38–126)
Anion gap: 17 — ABNORMAL HIGH (ref 5–15)
BUN: 18 mg/dL (ref 8–23)
CO2: 30 mmol/L (ref 22–32)
Calcium: 8.5 mg/dL — ABNORMAL LOW (ref 8.9–10.3)
Chloride: 95 mmol/L — ABNORMAL LOW (ref 98–111)
Creatinine, Ser: 1.15 mg/dL — ABNORMAL HIGH (ref 0.44–1.00)
GFR, Estimated: 53 mL/min — ABNORMAL LOW (ref >60)
Glucose, Bld: 102 mg/dL — ABNORMAL HIGH (ref 70–99)
Potassium: 3.6 mmol/L (ref 3.5–5.1)
Sodium: 142 mmol/L (ref 135–145)
Total Bilirubin: 2.2 mg/dL — ABNORMAL HIGH (ref 0.0–1.2)
Total Protein: 6.7 g/dL (ref 6.5–8.1)

## 2024-07-03 MED ORDER — CEFUROXIME AXETIL 250 MG PO TABS
250.0000 mg | ORAL_TABLET | Freq: Once | ORAL | Status: AC
  Administered 2024-07-03 (×2): 250 mg via ORAL
  Filled 2024-07-03: qty 1

## 2024-07-03 MED ORDER — OXYCODONE-ACETAMINOPHEN 5-325 MG PO TABS
1.0000 | ORAL_TABLET | Freq: Once | ORAL | Status: AC
  Administered 2024-07-03 (×2): 1 via ORAL
  Filled 2024-07-03: qty 1

## 2024-07-03 MED ORDER — CEFUROXIME AXETIL 250 MG PO TABS: 250.0000 mg | ORAL_TABLET | Freq: Two times a day (BID) | ORAL | 0 refills | Status: DC

## 2024-07-03 NOTE — Group Note (Deleted)
 Date:  07/03/2024 Time:  2:22 PM  Group Topic/Focus:  Wellness Toolbox:   The focus of this group is to discuss various aspects of wellness, balancing those aspects and exploring ways to increase the ability to experience wellness.  Patients will create a wellness toolbox for use upon discharge.     Participation Level:  {BHH PARTICIPATION OZCZO:77735}  Participation Quality:  {BHH PARTICIPATION QUALITY:22265}  Affect:  {BHH AFFECT:22266}  Cognitive:  {BHH COGNITIVE:22267}  Insight: {BHH Insight2:20797}  Engagement in Group:  {BHH ENGAGEMENT IN HMNLE:77731}  Modes of Intervention:  {BHH MODES OF INTERVENTION:22269}  Additional Comments:  ***  Myra Curtistine BROCKS 07/03/2024, 2:22 PM

## 2024-07-03 NOTE — ED Triage Notes (Signed)
 C/O left flank pain. Woke up this morning with pain.

## 2024-07-03 NOTE — ED Triage Notes (Signed)
 First Nurse Note:  Pt via [facility] from home. Pt c/o L sided flank pain since yesterday. Denies any NV. Pt is A&Ox4 and NAD  EMS reports:  173/102 BP  102 HR 90% on RA, and placed on 2L [place] per EMS

## 2024-07-03 NOTE — ED Notes (Signed)
 MD made aware of 185/116 at this time.

## 2024-07-03 NOTE — ED Provider Notes (Signed)
 [facility] Provider Note   Event Date/Time   First MD Initiated Contact with Patient 07/03/24 1458     (approximate) History  Flank Pain  HPI Jamie Wilkins is a 62 y.o. female with a past medical history of asthma, hypertension, chronic anemia, paroxysmal atrial fibrillation, and alcohol abuse who presents complaining of left hip pain.  Patient denies any recent trauma, heavy alcohol use, or any withdrawal symptoms in the past few days.  Patient denies any known falls or breaking up on the floor.  Patient states that this pain began this morning and has been stable since onset at 10/10, aching, and nonradiating. ROS: Patient currently denies any vision changes, tinnitus, difficulty speaking, facial droop, sore throat, chest pain, shortness of breath, abdominal pain, nausea/vomiting/diarrhea, dysuria, or weakness/numbness/paresthesias in any extremity   Physical Exam  Triage Vital Signs: ED Triage Vitals  Encounter Vitals Group     BP 07/03/24 0957 (!) 122/94     Girls Systolic BP Percentile --      Girls Diastolic BP Percentile --      Boys Systolic BP Percentile --      Boys Diastolic BP Percentile --      Pulse Rate 07/03/24 0957 (!) 108     Resp 07/03/24 1414 18     Temp 07/03/24 0957 98.1 F (36.7 C)     Temp Source 07/03/24 0957 Oral     SpO2 07/03/24 0957 (!) 89 %     Weight 07/03/24 0957 146 lb (66.2 kg)     Height 07/03/24 0957 5' 7 (1.702 m)     Head Circumference --      Peak Flow --      Pain Score 07/03/24 1000 9     Pain Loc --      Pain Education --      Exclude from Growth Chart --    Most recent vital signs: Vitals:   07/03/24 1700 07/03/24 1827  BP: (!) 171/98 (!) 185/116  Pulse: 73 72  Resp: 16   Temp:    SpO2: 97% 98%   General: Awake, oriented x4. CV:  Good peripheral perfusion. Resp:  Normal effort. Abd:  No distention. Other:  Elderly well-developed, well-nourished African-American female resting comfortably in no  acute distress.  Movement at the left lower extremity elicits pain in the left lateral aspect of the hip.  Mild tenderness to palpation over the left lateral hip ED Results / Procedures / Treatments  Labs (all labs ordered are listed, but only abnormal results are displayed) Labs Reviewed  CBC - Abnormal; Notable for the following components:      Result Value   WBC 2.9 (*)    RBC 3.32 (*)    Hemoglobin 11.4 (*)    HCT 33.8 (*)    MCV 101.8 (*)    MCH 34.3 (*)    RDW 16.3 (*)    nRBC 0.7 (*)    All other components within normal limits  COMPREHENSIVE METABOLIC PANEL WITH GFR - Abnormal; Notable for the following components:   Chloride 95 (*)    Glucose, Bld 102 (*)    Creatinine, Ser 1.15 (*)    Calcium  8.5 (*)    AST 79 (*)    Total Bilirubin 2.2 (*)    GFR, Estimated 53 (*)    Anion gap 17 (*)    All other components within normal limits  URINALYSIS, ROUTINE W REFLEX MICROSCOPIC - Abnormal; Notable for the following  components:   APPearance TURBID (*)    Hgb urine dipstick MODERATE (*)    Ketones, ur 5 (*)    Protein, ur 100 (*)    Nitrite POSITIVE (*)    Leukocytes,Ua LARGE (*)    Bacteria, UA MANY (*)    All other components within normal limits   RADIOLOGY ED MD interpretation: X-ray of the Left hip, pelvis, and chest shows no evidence of acute abnormalities - All radiology independently interpreted and agree with radiology assessment Official radiology report(s): DG Hip Unilat W or Wo Pelvis 2-3 Views Left Result Date: 07/03/2024 CLINICAL DATA:  Left hip pain. EXAM: DG HIP (WITH OR WITHOUT PELVIS) 2-3V LEFT COMPARISON:  None Available. FINDINGS: The bones are subjectively under mineralized. No fracture or dislocation. Femoral head is well seated. Hip joint spaces preserved. Pubic rami are intact. No pubic symphyseal or sacroiliac diastasis. Peripheral vascular calcifications are seen. IMPRESSION: 1. No acute findings or explanation for hip pain. 2. Peripheral vascular  disease. Electronically Signed   By: Andrea Gasman M.D.   On: 07/03/2024 17:25   DG Chest Portable 1 View Result Date: 07/03/2024 CLINICAL DATA:  Hypoxia.  Left hip pain. EXAM: PORTABLE CHEST 1 VIEW COMPARISON:  Radiograph 01/01/2024 FINDINGS: The cardiomediastinal contours are normal. Aortic atherosclerosis. Pulmonary vasculature is normal. No consolidation, pleural effusion, or pneumothorax. No acute osseous abnormalities are seen. IMPRESSION: No acute chest findings. Electronically Signed   By: Andrea Gasman M.D.   On: 07/03/2024 17:25   PROCEDURES: Critical Care performed: No Procedures MEDICATIONS ORDERED IN ED: Medications  cefUROXime  (CEFTIN ) tablet 250 mg (has no administration in time range)  oxyCODONE -acetaminophen  (PERCOCET/ROXICET) 5-325 MG per tablet 1 tablet (1 tablet Oral Given 07/03/24 1550)   IMPRESSION / MDM / ASSESSMENT AND PLAN / ED COURSE  I reviewed the triage vital signs and the nursing notes.                             The patient is on the cardiac monitor to evaluate for evidence of arrhythmia and/or significant heart rate changes. Patient's presentation is most consistent with acute presentation with potential threat to life or bodily function. Not Pregnant. Unlikely TOA, Ovarian Torsion, PID, gonorrhea/chlamydia. Low suspicion for Infected Urolithiasis, AAA, Cholecystitis, Pancreatitis, SBO, Appendicitis, or other acute abdomen. X-ray shows no signs of bony abnormality in the left hip Rx: Ceftin  250 mg BID for 5 days Disposition: Discharge home. SRP discussed. Advise follow up with primary care provider within 24-72 hours.   FINAL CLINICAL IMPRESSION(S) / ED DIAGNOSES   Final diagnoses:  Acute hip pain, left  Urinary tract infection with hematuria, site unspecified   Rx / DC Orders   ED Discharge Orders          Ordered    cefUROXime  (CEFTIN ) 250 MG tablet  2 times daily with meals        07/03/24 1929           Note:  This document was  prepared using Dragon voice recognition software and may include unintentional dictation errors.   Jossie Artist POUR, MD 07/03/24 [id number]

## 2024-07-03 NOTE — ED Notes (Signed)
 Pt assisted in bed by this RN and EDT. Pt provided peri care and clean brief and assisted in bed. Call light within reach.

## 2024-10-12 ENCOUNTER — Other Ambulatory Visit: Payer: Self-pay

## 2024-10-12 ENCOUNTER — Inpatient Hospital Stay
Admission: EM | Admit: 2024-10-12 | Discharge: 2024-10-15 | DRG: 378 | Disposition: A | Discharge status: Home Health | Attending: Obstetrics and Gynecology | Admitting: Obstetrics and Gynecology

## 2024-10-12 ENCOUNTER — Inpatient Hospital Stay

## 2024-10-12 ENCOUNTER — Emergency Department

## 2024-10-12 DIAGNOSIS — I480 Paroxysmal atrial fibrillation: Secondary | ICD-10-CM | POA: Diagnosis present

## 2024-10-12 DIAGNOSIS — D649 Anemia, unspecified: Secondary | ICD-10-CM

## 2024-10-12 DIAGNOSIS — K253 Acute gastric ulcer without hemorrhage or perforation: Secondary | ICD-10-CM

## 2024-10-12 DIAGNOSIS — R7989 Other specified abnormal findings of blood chemistry: Secondary | ICD-10-CM

## 2024-10-12 DIAGNOSIS — I10 Essential (primary) hypertension: Secondary | ICD-10-CM | POA: Insufficient documentation

## 2024-10-12 DIAGNOSIS — K922 Gastrointestinal hemorrhage, unspecified: Secondary | ICD-10-CM | POA: Diagnosis present

## 2024-10-12 DIAGNOSIS — N1831 Chronic kidney disease, stage 3a: Secondary | ICD-10-CM | POA: Insufficient documentation

## 2024-10-12 DIAGNOSIS — K921 Melena: Secondary | ICD-10-CM | POA: Diagnosis not present

## 2024-10-12 DIAGNOSIS — F1091 Alcohol use, unspecified, in remission: Secondary | ICD-10-CM | POA: Insufficient documentation

## 2024-10-12 DIAGNOSIS — R600 Localized edema: Principal | ICD-10-CM

## 2024-10-12 DIAGNOSIS — K7010 Alcoholic hepatitis without ascites: Secondary | ICD-10-CM | POA: Diagnosis present

## 2024-10-12 LAB — COMPREHENSIVE METABOLIC PANEL WITH GFR
ALT: 18 U/L (ref 0–44)
AST: 33 U/L (ref 15–41)
Albumin: 3.2 g/dL — ABNORMAL LOW (ref 3.5–5.0)
Alkaline Phosphatase: 176 U/L — ABNORMAL HIGH (ref 38–126)
Anion gap: 11 (ref 5–15)
BUN: 20 mg/dL (ref 8–23)
CO2: 22 mmol/L (ref 22–32)
Calcium: 8.5 mg/dL — ABNORMAL LOW (ref 8.9–10.3)
Chloride: 104 mmol/L (ref 98–111)
Creatinine, Ser: 1.19 mg/dL — ABNORMAL HIGH (ref 0.44–1.00)
GFR, Estimated: 51 mL/min — ABNORMAL LOW (ref >60)
Glucose, Bld: 81 mg/dL (ref 70–99)
Potassium: 4.2 mmol/L (ref 3.5–5.1)
Sodium: 137 mmol/L (ref 135–145)
Total Bilirubin: 0.4 mg/dL (ref 0.0–1.2)
Total Protein: 6.3 g/dL — ABNORMAL LOW (ref 6.5–8.1)

## 2024-10-12 LAB — BASIC METABOLIC PANEL WITH GFR
Anion gap: 12 (ref 5–15)
BUN: 18 mg/dL (ref 8–23)
CO2: 23 mmol/L (ref 22–32)
Calcium: 8.5 mg/dL — ABNORMAL LOW (ref 8.9–10.3)
Chloride: 102 mmol/L (ref 98–111)
Creatinine, Ser: 1.2 mg/dL — ABNORMAL HIGH (ref 0.44–1.00)
GFR, Estimated: 50 mL/min — ABNORMAL LOW (ref >60)
Glucose, Bld: 83 mg/dL (ref 70–99)
Potassium: 4.2 mmol/L (ref 3.5–5.1)
Sodium: 136 mmol/L (ref 135–145)

## 2024-10-12 LAB — IRON AND TIBC
Iron: 26 ug/dL — ABNORMAL LOW (ref 28–170)
Saturation Ratios: 12 % (ref 10.4–31.8)
TIBC: 221 ug/dL — ABNORMAL LOW (ref 250–450)
UIBC: 195 ug/dL

## 2024-10-12 LAB — MAGNESIUM: Magnesium: 1.6 mg/dL — ABNORMAL LOW (ref 1.7–2.4)

## 2024-10-12 LAB — TROPONIN T, HIGH SENSITIVITY
Troponin T High Sensitivity: 47 ng/L — ABNORMAL HIGH (ref 0–19)
Troponin T High Sensitivity: 41 ng/L — ABNORMAL HIGH (ref 0–19)

## 2024-10-12 LAB — CBC
HCT: 27.2 % — ABNORMAL LOW (ref 36.0–46.0)
Hemoglobin: 8.8 g/dL — ABNORMAL LOW (ref 12.0–15.0)
MCH: 33.6 pg (ref 26.0–34.0)
MCHC: 32.4 g/dL (ref 30.0–36.0)
MCV: 103.8 fL — ABNORMAL HIGH (ref 80.0–100.0)
Platelets: 246 K/uL (ref 150–400)
RBC: 2.62 MIL/uL — ABNORMAL LOW (ref 3.87–5.11)
RDW: 19.3 % — ABNORMAL HIGH (ref 11.5–15.5)
WBC: 5 K/uL (ref 4.0–10.5)
nRBC: 0 % (ref 0.0–0.2)

## 2024-10-12 LAB — FERRITIN: Ferritin: 533 ng/mL — ABNORMAL HIGH (ref 11–307)

## 2024-10-12 MED ORDER — PANTOPRAZOLE SODIUM 40 MG IV SOLR
40.0000 mg | Freq: Two times a day (BID) | INTRAVENOUS | Status: DC
  Administered 2024-10-13 – 2024-10-14 (×4): 40 mg via INTRAVENOUS
  Filled 2024-10-12 (×3): qty 10

## 2024-10-12 MED ORDER — ACETAMINOPHEN 650 MG RE SUPP: 650.0000 mg | Freq: Four times a day (QID) | RECTAL | Status: DC | PRN

## 2024-10-12 MED ORDER — PANTOPRAZOLE SODIUM 40 MG IV SOLR
40.0000 mg | INTRAVENOUS | Status: AC
  Administered 2024-10-12: 40 mg via INTRAVENOUS
  Filled 2024-10-12: qty 10

## 2024-10-12 MED ORDER — SODIUM CHLORIDE 0.9% FLUSH
3.0000 mL | Freq: Two times a day (BID) | INTRAVENOUS | Status: DC
  Administered 2024-10-12 – 2024-10-14 (×5): 3 mL via INTRAVENOUS

## 2024-10-12 MED ORDER — FUROSEMIDE 10 MG/ML IJ SOLN
60.0000 mg | Freq: Once | INTRAMUSCULAR | Status: AC
  Administered 2024-10-12: 60 mg via INTRAVENOUS
  Filled 2024-10-12: qty 8

## 2024-10-12 MED ORDER — ACETAMINOPHEN 325 MG PO TABS: 650.0000 mg | ORAL_TABLET | Freq: Four times a day (QID) | ORAL | Status: DC | PRN

## 2024-10-12 NOTE — H&P (Signed)
 History and Physical    Jamie Wilkins FMW:[id number] DOB: 20-Jun-1961 DOA: 10/12/2024  DOS: the patient was seen and examined on 10/12/2024  [facility]P: [facility]   Patient coming from: Home  I have personally briefly reviewed patient's old medical records in [facility] [place] and CareEverywhere  HPI:   Jamie Wilkins is a 63 y.o. 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   Pt Has been having black stools for last week but on further questioning states last 2-3 days. Swelling started 2 days ago. No shortness of breath. Right foot with pain and more swelling than left. States her feet are usually thin. Denies any infectious symptoms and denies any hematemesis, nausea or vomiting.   On arrival to the ED patient was noted to be HDS stable.  Lab work and imaging obtained.  CBC with hemoglobin at 8.8 from baseline but appears to be normal.  BMP with stable renal function.  Mild hypocalcemia.  Troponin mildly elevated at 47, with repeat pending.  Given the drop in hemoglobin, TRH contacted for admission.  Review of Systems: As mentioned in the history of present illness. All other systems reviewed and are negative.   Past Medical History:  Diagnosis Date   Asthma    Heavy menstrual period    Hypertension     Past Surgical History:  Procedure Laterality Date   ABDOMINAL HYSTERECTOMY     partial   COLONOSCOPY N/A 06/12/2021   Procedure: COLONOSCOPY;  Surgeon: Unk Corinn Skiff, MD;  Location: [facility] ENDOSCOPY;  Service: Gastroenterology;  Laterality: N/A;   ESOPHAGOGASTRODUODENOSCOPY N/A 06/10/2021   Procedure: ESOPHAGOGASTRODUODENOSCOPY (EGD);  Surgeon: Toledo, Ladell POUR, MD;  Location: [facility] ENDOSCOPY;  Service: Gastroenterology;  Laterality: N/A;    FOOT SURGERY     GIVENS CAPSULE STUDY N/A 06/12/2021   Procedure: GIVENS CAPSULE STUDY;  Surgeon: Unk Corinn Skiff, MD;  Location: [facility] ENDOSCOPY;  Service: Gastroenterology;  Laterality: N/A;   S/P BTL       No Known Allergies  Family History  Problem Relation Age of Onset   CAD Father    CAD Brother    CAD Brother     Prior to Admission medications   Medication Sig Start Date End Date Taking? Authorizing Provider  acetaminophen  (TYLENOL ) 325 MG tablet Take 2 tablets (650 mg total) by mouth every 6 (six) hours as needed for mild pain, fever or headache. 09/21/21   Milissa Tod PARAS, MD  albuterol  (VENTOLIN  HFA) 108 (90 Base) MCG/ACT inhaler Inhale 1-2 puffs into the lungs every 6 (six) hours as needed for wheezing or shortness of breath. 09/21/21   Milissa Tod PARAS, MD  albuterol  (VENTOLIN  HFA) 108 [phone] Base) MCG/ACT inhaler Inhale 1-2 puffs into the lungs once every 6 hours as needed for wheezing or shortness of breath. 09/21/21   Milissa Tod PARAS, MD  cefUROXime  (CEFTIN ) 250 MG tablet Take 1 tablet (250 mg total) by mouth 2 (two) times daily with a meal. 07/03/24   Bradler, Evan K, MD  ferrous sulfate  (FERROUSUL) 325 (65 FE) MG tablet Take 1 tablet (325 mg total) by mouth every other day. 06/12/21   Wouk, Devaughn Sayres, MD  hydrocortisone  cream 1 % Apply topically as needed for itching. 09/21/21   Milissa Tod PARAS, MD  hydrocortisone  cream 1 % Apply  one application topically to the affected area(s) daily as needed for itching. 09/21/21   Milissa Tod PARAS, MD  lidocaine  (LMX) 4 % cream Apply topically 3 (three) times daily as needed (foot pain). 09/21/21   Milissa Tod PARAS, MD  midodrine  (PROAMATINE ) 10 MG tablet Take 1 tablet (10 mg total) by mouth 3 (three) times daily with meals. 09/21/21   Milissa Tod PARAS, MD  midodrine  (PROAMATINE ) 5 MG tablet TAKE 2 TABLETS (10MG  TOTAL) BY MOUTH 3 TIMES DAILY WITH MEALS. 09/21/21   Milissa Tod PARAS, MD  mirtazapine  (REMERON ) 30 MG tablet Take 1 tablet (30 mg total)  by mouth at bedtime. 09/21/21   Milissa Tod PARAS, MD  mirtazapine  (REMERON ) 30 MG tablet Take 1 tablet (30 mg total) by mouth once daily at bedtime. 09/21/21   Milissa Tod PARAS, MD  Multiple Vitamins-Minerals (THERA-M) TABS Take 1 tablet by mouth daily. 08/04/21   [provider]  naproxen  (NAPROSYN ) 500 MG tablet Take 1 tablet (500 mg total) by mouth 2 (two) times daily with a meal. 01/02/23   Viviann Pastor, MD  nicotine  (NICODERM CQ  - DOSED IN MG/24 HOURS) 14 mg/24hr patch Place 1 patch (14 mg total) onto the skin daily. 09/22/21   Milissa Tod PARAS, MD  thiamine  100 MG tablet Take 1 tablet by mouth daily. 08/04/21   [provider]  traMADol  (ULTRAM ) 50 MG tablet Take 1 tablet (50 mg total) by mouth every 6 (six) hours as needed for moderate pain or severe pain. 09/21/21   Milissa Tod PARAS, MD  Zinc  Oxide (TRIPLE PASTE) 12.8 % ointment Apply topically as needed for irritation. 09/21/21   Milissa Tod PARAS, MD    Social History:  reports that she has been smoking cigarettes. She has never used smokeless tobacco. She reports current alcohol use of about 7.0 standard drinks of alcohol per week. She reports that she does not use drugs. Lives with by self Tobacco- 1/2 ppd. Before she was doing 1 ppd.  EtOH- liquor use but none since last 3 months. Illicit drug use- denies use.  IADLs/ADLs- can perform independently at baseline    Physical Exam: Vitals:   10/12/24 1319  BP: (!) 172/105  Pulse: 84  Resp: 16  Temp: 98 F (36.7 C)  TempSrc: Oral  SpO2: 100%    Physical Exam General: NAD HENT: NCAT Lungs: CTAB, decreases movement in bases Cardiovascular: Normal heart sounds, no r/m/g, 2 + lower extremity edema Abdomen: No TTP, normal bowel sounds MSK: right leg >left Skin: no lesions noted on exposed skin Neuro: Alert and oriented x4. CN grossly intact. Does appear to have mild cognitive decline. Psych: Normal mood and normal affect    Labs on Admission: I have personally  reviewed following labs and imaging studies  CBC: Recent Labs  Lab 10/12/24 1323  WBC 5.0  HGB 8.8*  HCT 27.2*  MCV 103.8*  PLT 246   Basic Metabolic Panel: Recent Labs  Lab 10/12/24 1323  NA 136  K 4.2  CL 102  CO2 23  GLUCOSE 83  BUN 18  CREATININE 1.20*  CALCIUM  8.5*   GFR: CrCl cannot be calculated (Unknown ideal weight.). Liver Function Tests: No results for input(s): AST, ALT, ALKPHOS, BILITOT, PROT, ALBUMIN  in the last 168 hours. No results for input(s): LIPASE, AMYLASE in the last 168 hours. No results for input(s): AMMONIA in the last 168 hours. Coagulation Profile: No results for input(s): INR, PROTIME in the last 168 hours. Cardiac Enzymes: No results for  input(s): CKTOTAL, CKMB, CKMBINDEX, TROPONINI, TROPONINIHS in the last 168 hours. BNP (last 3 results) No results for input(s): BNP in the last 8760 hours. HbA1C: No results for input(s): HGBA1C in the last 72 hours. CBG: No results for input(s): GLUCAP in the last 168 hours. Lipid Profile: No results for input(s): CHOL, HDL, LDLCALC, TRIG, CHOLHDL, LDLDIRECT in the last 72 hours. Thyroid Function Tests: No results for input(s): TSH, T4TOTAL, FREET4, T3FREE, THYROIDAB in the last 72 hours. Anemia Panel: No results for input(s): VITAMINB12, FOLATE, FERRITIN, TIBC, IRON , RETICCTPCT in the last 72 hours. Urine analysis:    Component Value Date/Time   COLORURINE YELLOW 07/03/2024 1820   APPEARANCEUR TURBID (A) 07/03/2024 1820   LABSPEC 1.012 07/03/2024 1820   PHURINE 6.0 07/03/2024 1820   GLUCOSEU NEGATIVE 07/03/2024 1820   HGBUR MODERATE (A) 07/03/2024 1820   BILIRUBINUR NEGATIVE 07/03/2024 1820   KETONESUR 5 (A) 07/03/2024 1820   PROTEINUR 100 (A) 07/03/2024 1820   NITRITE POSITIVE (A) 07/03/2024 1820   LEUKOCYTESUR LARGE (A) 07/03/2024 1820    Radiological Exams on Admission: I have personally reviewed images DG Chest 2  View Result Date: 10/12/2024 CLINICAL DATA:  leg swelling EXAM: CHEST - 2 VIEW COMPARISON:  July 03, 2024 June 09, 2021 FINDINGS: The cardiomediastinal silhouette is unchanged in contour.Flattening of the hemidiaphragms. No significant pleural effusion. No pneumothorax. No acute pleuroparenchymal abnormality. Visualized abdomen is unremarkable. Multilevel degenerative changes of the thoracic spine. IMPRESSION: No acute cardiopulmonary abnormality. Electronically Signed   By: Corean Salter M.D.   On: 10/12/2024 13:54    EKG: My personal interpretation of EKG shows: Sinus rhythm without any acute ST changes with heart rate around 60 bpm.    Assessment/Plan Principal Problem:   GI bleeding Active Problems:   Alcohol use disorder in remission   Essential hypertension   Patient with likely GI bleeding given her report of black stools and drop in hemoglobin.  Will admit patient to progressive level of care.  GI consulted. Appreciate their recommendations.  Will start PPI, and ensure patient has 2 PIV's.  Monitor H&H every 12 hours if patient becomes hemodynamically stable.  Will check iron  levels.  Currently patient is hemodynamically stable and not tachycardic suggesting against acute significant blood loss.  Transfuse if hemoglobin is less than 7 or patient is hemodynamic unstable and hemoglobin is less than 8..  Patient had a colonoscopy in 2022 that did show concerning for GI bleeding.  She has history of hepatic steatosis but not cirrhosis on my chart review.  Patient also states she has not been told she has cirrhosis.   Lower extremity swelling: Patient with lower extremity swelling.  Will check Doppler to ensure no blood clot given she is having pain.  This could be component of heart failure in setting of GI bleed.  Echo reviewed from 2022 and no heart failure concerns noted but she did have some LVH at that time.  She is given 1 dose of Lasix  in the ED.  Will get echocardiogram.  And  advised her to elevate her legs.   Hypertension: Currently hypertensive but will monitor given she has concern for GI bleed.  Hepatic steatosis: Likely secondary to alcohol use.  Patient has been abstinence for about 3 months.  Congratulated her on this.  Paroxysmal A-fib: Currently in sinus rhythm.  Not a candidate for anticoagulation given her history of GI bleeds.  Asthma: Continue home inhaler as needed.  Mild cognitive impairment: Noted on interview.  Will need outpatient  workup for this.  Will check TSH, B12.  VTE prophylaxis:  SCDs  Diet: N.p.o. Code Status:  Full Code Telemetry:  Admission status: Inpatient, Telemetry bed Patient is from: Home Anticipated d/c is to: Home Anticipated d/c is in: 3-4 days   Family Communication: Updated at bedside  Consults called: None   Severity of Illness: Almost.  MemberThe appropriate patient status for this patient is INPATIENT. Inpatient status is judged to be reasonable and necessary in order to provide the required intensity of service to ensure the patient's safety. The patient's presenting symptoms, physical exam findings, and initial radiographic and laboratory data in the context of their chronic comorbidities is felt to place them at high risk for further clinical deterioration. Furthermore, it is not anticipated that the patient will be medically stable for discharge from the hospital within 2 midnights of admission.   * I certify that at the point of admission it is my clinical judgment that the patient will require inpatient hospital care spanning beyond 2 midnights from the point of admission due to high intensity of service, high risk for further deterioration and high frequency of surveillance required.DEWAINE Morene Bathe, MD Jamie Wilkins. [facility]

## 2024-10-12 NOTE — ED Provider Notes (Signed)
 [facility] Provider Note    Event Date/Time   First MD Initiated Contact with Patient 10/12/24 1347     (approximate)  History   Chief Complaint: Leg Swelling  HPI  Jamie Wilkins is a 63 y.o. female with a past medical history of hypertension, past alcohol use (sober x 1.5 months) presents to the emergency department for lower extremity swelling.  According to the patient for the past 1 week she has been experiencing progressive worsening swelling to her lower extremities.  Has also been feeling somewhat weak recently as well.  Patient is also noted over the last 3 days very dark stool.  Physical Exam   Triage Vital Signs: ED Triage Vitals  Encounter Vitals Group     BP 10/12/24 1319 (!) 172/105     Girls Systolic BP Percentile --      Girls Diastolic BP Percentile --      Boys Systolic BP Percentile --      Boys Diastolic BP Percentile --      Pulse Rate 10/12/24 1319 84     Resp 10/12/24 1319 16     Temp 10/12/24 1319 98 F (36.7 C)     Temp Source 10/12/24 1319 Oral     SpO2 10/12/24 1319 100 %     Weight --      Height --      Head Circumference --      Peak Flow --      Pain Score 10/12/24 1320 10     Pain Loc --      Pain Education --      Exclude from Growth Chart --     Most recent vital signs: Vitals:   10/12/24 1319  BP: (!) 172/105  Pulse: 84  Resp: 16  Temp: 98 F (36.7 C)  SpO2: 100%    General: Awake, no distress.  CV:  Good peripheral perfusion.  Regular rate and rhythm  Resp:  Normal effort.  Equal breath sounds bilaterally.  Abd:  No distention.  Soft, nontender.  No rebound or guarding.  Benign abdomen.  ED Results / Procedures / Treatments   EKG  EKG viewed and interpreted by myself shows a sinus rhythm at 158 bpm with a narrow QRS, normal axis, normal intervals, no concerning ST changes.  RADIOLOGY  I have reviewed interpret the chest x-ray images.  No consolidation seen on my evaluation. Radiology  has read the x-ray is negative   MEDICATIONS ORDERED IN ED: Medications  furosemide  (LASIX ) injection 60 mg (has no administration in time range)     IMPRESSION / MDM / ASSESSMENT AND PLAN / ED COURSE  I reviewed the triage vital signs and the nursing notes.  Patient's presentation is most consistent with acute presentation with potential threat to life or bodily function.  Patient presents to the emergency department with lower extremity swelling and some generalized weakness.  Also dark stool for the last 2 to 3 days.  Patient's lab work today shows a CBC with a hemoglobin of 8.8 down from 11 3 months ago.  Given the patient's jet black stool for the last 2 to 3 days highly suspect GI bleed leading to lower hemoglobin and possibly high-output heart failure.  Patient's troponin is slightly elevated at the suspect more demand ischemia given the patient's elevated heart rate least on EKG.  Chemistry shows no significant finding.  Chest x-ray is clear.  However given the patient's jet black stool concerning for  melena with hemoglobin drop we will start the patient on Protonix .  Given the patient's lower extremity edema we will start the patient on Lasix .  Will admit to the hospital service for further workup and treatment.  Patient agreeable to plan of care.  FINAL CLINICAL IMPRESSION(S) / ED DIAGNOSES   GI bleed Anemia Elevated troponin Peripheral edema    Note:  This document was prepared using Dragon voice recognition software and may include unintentional dictation errors.   Dorothyann Drivers, MD 10/12/24 1455

## 2024-10-12 NOTE — ED Triage Notes (Signed)
 C/O bilateral leg swelling x 1 week.  States has history of same, has never been evaluated for symptoms.  AAOx3. Skin warm and dry. No SOB/ DOE. Denies chest pain.

## 2024-10-12 NOTE — ED Notes (Signed)
 Pt endorses black tarry stools since 2 days. Pt has significant bilateral foot and ankle swelling since last night. EDP at bedside talking to pt and explaining findings so far.

## 2024-10-12 NOTE — ED Notes (Signed)
 Called CCMD and added pt to board

## 2024-10-13 ENCOUNTER — Inpatient Hospital Stay: Admit: 2024-10-13 | Discharge: 2024-10-13 | Disposition: A | Attending: Internal Medicine

## 2024-10-13 DIAGNOSIS — K921 Melena: Secondary | ICD-10-CM

## 2024-10-13 DIAGNOSIS — I361 Nonrheumatic tricuspid (valve) insufficiency: Secondary | ICD-10-CM | POA: Diagnosis not present

## 2024-10-13 DIAGNOSIS — R7989 Other specified abnormal findings of blood chemistry: Secondary | ICD-10-CM | POA: Diagnosis not present

## 2024-10-13 DIAGNOSIS — N1831 Chronic kidney disease, stage 3a: Secondary | ICD-10-CM | POA: Insufficient documentation

## 2024-10-13 LAB — CBC
HCT: 25.1 % — ABNORMAL LOW (ref 36.0–46.0)
Hemoglobin: 8.3 g/dL — ABNORMAL LOW (ref 12.0–15.0)
MCH: 33.7 pg (ref 26.0–34.0)
MCHC: 33.1 g/dL (ref 30.0–36.0)
MCV: 102 fL — ABNORMAL HIGH (ref 80.0–100.0)
Platelets: 252 10*3/uL (ref 150–400)
RBC: 2.46 MIL/uL — ABNORMAL LOW (ref 3.87–5.11)
RDW: 18.6 % — ABNORMAL HIGH (ref 11.5–15.5)
WBC: 3.7 10*3/uL — ABNORMAL LOW (ref 4.0–10.5)
nRBC: 0 % (ref 0.0–0.2)

## 2024-10-13 LAB — COMPREHENSIVE METABOLIC PANEL WITH GFR
ALT: 16 U/L (ref 0–44)
AST: 27 U/L (ref 15–41)
Albumin: 3.2 g/dL — ABNORMAL LOW (ref 3.5–5.0)
Alkaline Phosphatase: 164 U/L — ABNORMAL HIGH (ref 38–126)
Anion gap: 10 (ref 5–15)
BUN: 18 mg/dL (ref 8–23)
CO2: 26 mmol/L (ref 22–32)
Calcium: 8.5 mg/dL — ABNORMAL LOW (ref 8.9–10.3)
Chloride: 104 mmol/L (ref 98–111)
Creatinine, Ser: 1.16 mg/dL — ABNORMAL HIGH (ref 0.44–1.00)
GFR, Estimated: 52 mL/min — ABNORMAL LOW (ref >60)
Glucose, Bld: 120 mg/dL — ABNORMAL HIGH (ref 70–99)
Potassium: 4.1 mmol/L (ref 3.5–5.1)
Sodium: 140 mmol/L (ref 135–145)
Total Bilirubin: 0.4 mg/dL (ref 0.0–1.2)
Total Protein: 5.6 g/dL — ABNORMAL LOW (ref 6.5–8.1)

## 2024-10-13 LAB — ECHOCARDIOGRAM COMPLETE
AR max vel: 3.07 cm2
AV Peak grad: 3.5 mmHg
Ao pk vel: 0.93 m/s
Area-P 1/2: 3.99 cm2
S' Lateral: 2.6 cm
Weight: 1876.56 [oz_av]

## 2024-10-13 LAB — BASIC METABOLIC PANEL WITH GFR
Anion gap: 9 (ref 5–15)
BUN: 18 mg/dL (ref 8–23)
CO2: 26 mmol/L (ref 22–32)
Calcium: 8.4 mg/dL — ABNORMAL LOW (ref 8.9–10.3)
Chloride: 106 mmol/L (ref 98–111)
Creatinine, Ser: 1.07 mg/dL — ABNORMAL HIGH (ref 0.44–1.00)
GFR, Estimated: 58 mL/min — ABNORMAL LOW
Glucose, Bld: 60 mg/dL — ABNORMAL LOW (ref 70–99)
Potassium: 4.1 mmol/L (ref 3.5–5.1)
Sodium: 140 mmol/L (ref 135–145)

## 2024-10-13 LAB — HEMOGLOBIN AND HEMATOCRIT, BLOOD
HCT: 26.7 % — ABNORMAL LOW (ref 36.0–46.0)
Hemoglobin: 8.4 g/dL — ABNORMAL LOW (ref 12.0–15.0)

## 2024-10-13 LAB — TSH: TSH: 1.64 u[IU]/mL (ref 0.350–4.500)

## 2024-10-13 LAB — GLUCOSE, CAPILLARY: Glucose-Capillary: 71 mg/dL (ref 70–99)

## 2024-10-13 LAB — FOLATE: Folate: 4.5 ng/mL — ABNORMAL LOW (ref >5.9)

## 2024-10-13 LAB — VITAMIN B12: Vitamin B-12: 246 pg/mL (ref 180–914)

## 2024-10-13 LAB — HIV ANTIBODY (ROUTINE TESTING W REFLEX): HIV Screen 4th Generation wRfx: NONREACTIVE

## 2024-10-13 MED ORDER — THIAMINE MONONITRATE 100 MG PO TABS
100.0000 mg | ORAL_TABLET | Freq: Every day | ORAL | Status: DC
  Administered 2024-10-13 – 2024-10-15 (×3): 100 mg via ORAL
  Filled 2024-10-13 (×3): qty 1

## 2024-10-13 MED ORDER — FOLIC ACID 1 MG PO TABS
1.0000 mg | ORAL_TABLET | Freq: Every day | ORAL | Status: DC
  Administered 2024-10-13 – 2024-10-15 (×3): 1 mg via ORAL
  Filled 2024-10-13 (×3): qty 1

## 2024-10-13 NOTE — Plan of Care (Signed)

## 2024-10-13 NOTE — Progress Notes (Signed)
 PROGRESS NOTE    Jamie Wilkins  FMW:[id number] DOB: 1961/01/09 DOA: 10/12/2024 PCP: [facility]  Outpatient Specialists: none    Brief Narrative:   Jamie Wilkins is a 63 y.o. 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     Pt Has been having black stools for last week but on further questioning states last 2-3 days. Swelling started 2 days ago. No shortness of breath. Right foot with pain and more swelling than left. States her feet are usually thin. Denies any infectious symptoms and denies any hematemesis, nausea or vomiting.    On arrival to the ED patient was noted to be HDS stable.  Lab work and imaging obtained.  CBC with hemoglobin at 8.8 from baseline but appears to be normal.  BMP with stable renal function.  Mild hypocalcemia.  Troponin mildly elevated at 47, with repeat pending.  Given the drop in hemoglobin, [facility] contacted for admission.   Assessment & Plan:   Principal Problem:   GI bleeding Active Problems:   Alcoholic hepatitis without ascites (HCC)   AF (paroxysmal atrial fibrillation) (HCC)   Alcohol use disorder in remission   Essential hypertension   CKD stage 3a, GFR 45-59 ml/min (HCC)   # Macrocytic anemia Hgb stable in the 8s, was 11-14s earlier this year. Does report melena. Hx ischemic colitis in 2022, currently no pain or diarrhea so doubt that. Denies nsaids.  - f/u b12/folate - gi to see - continue IV ppi - trend hgb  # Lower extremity edema Mild. Admitter checked u/s which shows no dvt. No bnp but TTE ordered, result pending. LFTs normal - f/u TTE - f/u upc, tsh  # Alcohol use disorder # Alcohol liver disease Reports abstinence 3 months, no s/s withdrawal. 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 Sinus on EKG - monitor  # HTN Bp elevated - med rec pending  # Mild cognitive impairment Vs dementia. Calm. Likely at baseline - monitor - f/u b12, tsh   DVT prophylaxis: scds Code Status: full Family Communication: none  Level of care: Progressive Status is: Inpatient Remains inpatient appropriate because: severity of illness    Consultants:  GI  Procedures: None thus far  Antimicrobials:  none    Subjective: Reports feeling well, no abd pain, no nausea, tolerating diet, breathing comfortably  Objective: Vitals:   10/13/24 0024 10/13/24 0421 10/13/24 0500 10/13/24 0941  BP: (!) 170/117 (!) 139/97  (!) 162/99  Pulse: 94 95  97  Resp: 19 20  16   Temp: 98.2 F (36.8 C) 98.4 F (36.9 C)  98.1 F (36.7 C)  TempSrc:    Oral  SpO2: 99% 96%  97%  Weight:   53.2 kg    No intake or output data in the 24 hours ending 10/13/24 1000 Filed Weights   10/13/24 0500  Weight: 53.2 kg    Examination:  General exam: Appears calm and comfortable  Respiratory system: Clear to auscultation. Respiratory effort normal. Cardiovascular system: S1 & S2 heard, RRR. No JVD, murmurs, rubs, gallops or clicks. No pedal edema. Gastrointestinal system: Abdomen is nondistended, soft and nontender. No organomegaly or masses felt. Normal bowel sounds heard. Central nervous system: Alert and oriented. No focal neurological  deficits. Extremities: Symmetric 5 x 5 power. Skin: No rashes, lesions or ulcers Psychiatry: Judgement and insight appear normal. Mood & affect appropriate.     Data Reviewed: I have personally reviewed following labs and imaging studies  CBC: Recent Labs  Lab 10/12/24 1323 10/13/24 0017 10/13/24 0545  WBC 5.0  --  3.7*  HGB 8.8* 8.4* 8.3*  HCT 27.2* 26.7* 25.1*  MCV 103.8*  --  102.0*  PLT 246  --  252   Basic Metabolic Panel: Recent Labs  Lab 10/12/24 1323 10/13/24 0545  NA 137   136 140  K 4.2  4.2 4.1  CL 104  102 106  CO2 22  23 26   GLUCOSE 81  83 60*  BUN 20  18 18   CREATININE 1.19*  1.20* 1.07*  CALCIUM  8.5*  8.5* 8.4*  MG 1.6*  --    GFR: Estimated Creatinine Clearance: 44.6 mL/min (A) (by C-G formula based on SCr of 1.07 mg/dL (H)). Liver Function Tests: Recent Labs  Lab 10/12/24 1323  AST 33  ALT 18  ALKPHOS 176*  BILITOT 0.4  PROT 6.3*  ALBUMIN  3.2*   No results for input(s): LIPASE, AMYLASE in the last 168 hours. No results for input(s): AMMONIA in the last 168 hours. Coagulation Profile: No results for input(s): INR, PROTIME in the last 168 hours. Cardiac Enzymes: No results for input(s): CKTOTAL, CKMB, CKMBINDEX, TROPONINI in the last 168 hours. BNP (last 3 results) No results for input(s): PROBNP in the last 8760 hours. HbA1C: No results for input(s): HGBA1C in the last 72 hours. CBG: Recent Labs  Lab 10/13/24 0944  GLUCAP 71   Lipid Profile: No results for input(s): CHOL, HDL, LDLCALC, TRIG, CHOLHDL, LDLDIRECT in the last 72 hours. Thyroid Function Tests: No results for input(s): TSH, T4TOTAL, FREET4, T3FREE, THYROIDAB in the last 72 hours. Anemia Panel: Recent Labs    10/12/24 1323  FERRITIN 533*  TIBC 221*  IRON  26*   Urine analysis:    Component Value Date/Time   COLORURINE YELLOW 07/03/2024 1820   APPEARANCEUR TURBID (A) 07/03/2024 1820   LABSPEC 1.012 07/03/2024 1820   PHURINE 6.0 07/03/2024 1820   GLUCOSEU NEGATIVE 07/03/2024 1820   HGBUR MODERATE (A) 07/03/2024 1820   BILIRUBINUR NEGATIVE 07/03/2024 1820   KETONESUR 5 (A) 07/03/2024 1820   PROTEINUR 100 (A) 07/03/2024 1820   NITRITE POSITIVE (A) 07/03/2024 1820   LEUKOCYTESUR LARGE (A) 07/03/2024 1820   Sepsis Labs: @LABRCNTIP (procalcitonin:4,lacticidven:4)  )No results found for this or any previous visit (from the past 240 hours).       Radiology Studies: US  Venous Img Lower Bilateral  (DVT) Result Date: 10/12/2024 CLINICAL DATA:  Bilateral lower extremity swelling. EXAM: Bilateral LOWER EXTREMITY VENOUS DOPPLER ULTRASOUND TECHNIQUE: Gray-scale sonography with compression, as well as color and duplex ultrasound, were performed to evaluate the deep venous system(s) from the level of the common femoral vein through the popliteal and proximal calf veins. COMPARISON:  None Available. FINDINGS: VENOUS Normal compressibility of the common femoral, superficial femoral, and popliteal veins, as well as the visualized calf veins. Visualized portions of profunda femoral vein and great saphenous vein unremarkable. No filling defects to suggest DVT on grayscale or color Doppler imaging. Doppler waveforms show normal direction of venous flow, normal respiratory plasticity and response to augmentation. Limited views of the contralateral common femoral vein are unremarkable. OTHER Are mild bilateral calf subcutaneous edema. Limitations: none IMPRESSION: 1. No evidence of DVT. 2. Mild bilateral calf subcutaneous edema. Electronically Signed  By: Vanetta Chou M.D.   On: 10/12/2024 17:57   DG Chest 2 View Result Date: 10/12/2024 CLINICAL DATA:  leg swelling EXAM: CHEST - 2 VIEW COMPARISON:  July 03, 2024 June 09, 2021 FINDINGS: The cardiomediastinal silhouette is unchanged in contour.Flattening of the hemidiaphragms. No significant pleural effusion. No pneumothorax. No acute pleuroparenchymal abnormality. Visualized abdomen is unremarkable. Multilevel degenerative changes of the thoracic spine. IMPRESSION: No acute cardiopulmonary abnormality. Electronically Signed   By: Corean Salter M.D.   On: 10/12/2024 13:54        Scheduled Meds:  pantoprazole  (PROTONIX ) IV  40 mg Intravenous Q12H   sodium chloride  flush  3 mL Intravenous Q12H   Continuous Infusions:   LOS: 1 day     Devaughn KATHEE Ban, MD [facility]   If 7PM-7AM, please contact night-coverage [email or web address] Password  [email or web address] 10/13/2024, 10:00 AM

## 2024-10-13 NOTE — Progress Notes (Signed)
  Echocardiogram 2D Echocardiogram has been performed.  Jamie Wilkins 10/13/2024, 9:28 AM

## 2024-10-13 NOTE — Consult Note (Signed)
 Consultation  Referring Provider:   Hospitalist   Admit date: 10/12/2024 Consult date: 10/13/2024         Reason for Consultation: Melena/Anemia              HPI:   Jamie Wilkins is a 63 y.o. lady with history of alcohol abuse, fatty liver, and anemia here with melenic stool. Last bowel movement was yesterday. Labs showed hemoglobin of 8 with baseline being normal. She does have a macrocytic anemia but unclear if this is related to past alcohol use. She has not had alcohol for three months. Vitamin b12 is pending. Had similar episode a few years ago with EGD being normal but colonoscopy showing ischemic colitis. She denies any constipation. Denies any other sources of bleeding. No blood thinners or NSAID use.  Past Medical History:  Diagnosis Date   Asthma    Heavy menstrual period    Hypertension     Past Surgical History:  Procedure Laterality Date   ABDOMINAL HYSTERECTOMY     partial   COLONOSCOPY N/A 06/12/2021   Procedure: COLONOSCOPY;  Surgeon: Unk Corinn Skiff, MD;  Location: [facility] ENDOSCOPY;  Service: Gastroenterology;  Laterality: N/A;   ESOPHAGOGASTRODUODENOSCOPY N/A 06/10/2021   Procedure: ESOPHAGOGASTRODUODENOSCOPY (EGD);  Surgeon: Toledo, Ladell POUR, MD;  Location: [facility] ENDOSCOPY;  Service: Gastroenterology;  Laterality: N/A;   FOOT SURGERY     GIVENS CAPSULE STUDY N/A 06/12/2021   Procedure: GIVENS CAPSULE STUDY;  Surgeon: Unk Corinn Skiff, MD;  Location: [facility] ENDOSCOPY;  Service: Gastroenterology;  Laterality: N/A;   S/P BTL      Family History  Problem Relation Age of Onset   CAD Father    CAD Brother    CAD Brother     Social History   Tobacco Use   Smoking status: Every Day    Current packs/day: 0.50    Types: Cigarettes   Smokeless tobacco: Never  Vaping Use   Vaping status: Never Used  Substance Use Topics   Alcohol use: Yes    Alcohol/week: 7.0 standard drinks of alcohol    Types: 7 Standard drinks or equivalent per week   Drug use: Never     Prior to Admission medications   Medication Sig Start Date End Date Taking? Authorizing Provider  acetaminophen  (TYLENOL ) 325 MG tablet Take 2 tablets (650 mg total) by mouth every 6 (six) hours as needed for mild pain, fever or headache. 09/21/21   Milissa Tod PARAS, MD  albuterol  (VENTOLIN  HFA) 108 [phone] Base) MCG/ACT inhaler Inhale 1-2 puffs into the lungs every 6 (six) hours as needed for wheezing or shortness of breath. 09/21/21   Milissa Tod PARAS, MD  albuterol  (VENTOLIN  HFA) 108 [phone] Base) MCG/ACT inhaler Inhale 1-2 puffs into the lungs once every 6 hours as needed for wheezing or shortness of breath. 09/21/21   Milissa Tod PARAS, MD  cefUROXime  (CEFTIN ) 250 MG tablet Take 1 tablet (250 mg total) by mouth 2 (two) times daily with a meal. 07/03/24   Bradler, Evan K, MD  ferrous sulfate  (FERROUSUL) 325 (65 FE) MG tablet Take 1 tablet (325 mg total) by mouth every other day. 06/12/21   Wouk, Devaughn Sayres, MD  hydrocortisone  cream 1 % Apply topically as needed for itching. 09/21/21   Milissa Tod PARAS, MD  hydrocortisone  cream 1 % Apply one application topically to the affected area(s) daily as needed for itching. 09/21/21   Milissa Tod PARAS, MD  lidocaine  (LMX) 4 % cream Apply topically 3 (three) times daily  as needed (foot pain). 09/21/21   Milissa Tod PARAS, MD  midodrine  (PROAMATINE ) 10 MG tablet Take 1 tablet (10 mg total) by mouth 3 (three) times daily with meals. 09/21/21   Milissa Tod PARAS, MD  midodrine  (PROAMATINE ) 5 MG tablet TAKE 2 TABLETS (10MG  TOTAL) BY MOUTH 3 TIMES DAILY WITH MEALS. 09/21/21   Milissa Tod PARAS, MD  mirtazapine  (REMERON ) 30 MG tablet Take 1 tablet (30 mg total) by mouth at bedtime. 09/21/21   Milissa Tod PARAS, MD  mirtazapine  (REMERON ) 30 MG tablet Take 1 tablet (30 mg total) by mouth once daily at bedtime. 09/21/21   Milissa Tod PARAS, MD  Multiple Vitamins-Minerals (THERA-M) TABS Take 1 tablet by mouth daily. 08/04/21   [provider]  naproxen  (NAPROSYN ) 500 MG tablet Take 1  tablet (500 mg total) by mouth 2 (two) times daily with a meal. 01/02/23   Viviann Pastor, MD  nicotine  (NICODERM CQ  - DOSED IN MG/24 HOURS) 14 mg/24hr patch Place 1 patch (14 mg total) onto the skin daily. 09/22/21   Milissa Tod PARAS, MD  thiamine  100 MG tablet Take 1 tablet by mouth daily. 08/04/21   [provider]  traMADol  (ULTRAM ) 50 MG tablet Take 1 tablet (50 mg total) by mouth every 6 (six) hours as needed for moderate pain or severe pain. 09/21/21   Milissa Tod PARAS, MD  Zinc  Oxide (TRIPLE PASTE) 12.8 % ointment Apply topically as needed for irritation. 09/21/21   Milissa Tod PARAS, MD    Current Facility-Administered Medications  Medication Dose Route Frequency Provider Last Rate Last Admin   acetaminophen  (TYLENOL ) tablet 650 mg  650 mg Oral Q6H PRN Fernand Prost, MD       Or   acetaminophen  (TYLENOL ) suppository 650 mg  650 mg Rectal Q6H PRN Fernand Prost, MD       folic acid  (FOLVITE ) tablet 1 mg  1 mg Oral Daily Wouk, Devaughn Sayres, MD   1 mg at 10/13/24 1153   pantoprazole  (PROTONIX ) injection 40 mg  40 mg Intravenous Q12H Paduchowski, Kevin, MD   40 mg at 10/13/24 9167   sodium chloride  flush (NS) 0.9 % injection 3 mL  3 mL Intravenous Q12H Fernand Prost, MD   3 mL at 10/13/24 9167   thiamine  (VITAMIN B1) tablet 100 mg  100 mg Oral Daily Kandis Devaughn Sayres, MD   100 mg at 10/13/24 1153    Allergies as of 10/12/2024   (No Known Allergies)     Review of Systems:    All systems reviewed and negative except where noted in HPI.  Review of Systems  Constitutional:  Negative for chills and fever.  Respiratory:  Negative for shortness of breath.   Cardiovascular:  Negative for chest pain.  Gastrointestinal:  Positive for melena. Negative for abdominal pain, constipation, diarrhea, nausea and vomiting.  Musculoskeletal:  Positive for joint pain.  Skin:  Negative for rash.  Neurological:  Negative for focal weakness.  Psychiatric/Behavioral:  Negative for substance abuse.    All other systems reviewed and are negative.      Physical Exam:  Vital signs in last 24 hours: Temp:  [97.8 F (36.6 C)-98.4 F (36.9 C)] 98.1 F (36.7 C) (11/23 0941) Pulse Rate:  [75-97] 97 (11/23 0941) Resp:  [16-20] 16 (11/23 0941) BP: (139-187)/(97-145) 162/99 (11/23 0941) SpO2:  [96 %-100 %] 97 % (11/23 0941) Weight:  [53.2 kg] 53.2 kg (11/23 0500) Last BM Date :  (PTA) General:   Pleasant in NAD Head:  Normocephalic and atraumatic. Eyes:   No icterus.   Conjunctiva pink. Mouth: Mucosa pink moist, no lesions. Neck:  Supple; no masses felt Lungs:  No respiratory distress Abdomen:   Flat, soft, nondistended, nontender Msk:  No clubbing or cyanosis. Neurologic:  Alert and  oriented x4;  No focal deficits Skin:  Warm, dry, pink without significant lesions or rashes. Psych:  Alert and cooperative. Normal affect.  LAB RESULTS: Recent Labs    10/12/24 1323 10/13/24 0017 10/13/24 0545  WBC 5.0  --  3.7*  HGB 8.8* 8.4* 8.3*  HCT 27.2* 26.7* 25.1*  PLT 246  --  252   BMET Recent Labs    10/12/24 1323 10/13/24 0545 10/13/24 1007  NA 137  136 140 140  K 4.2  4.2 4.1 4.1  CL 104  102 106 104  CO2 22  23 26 26   GLUCOSE 81  83 60* 120*  BUN 20  18 18 18   CREATININE 1.19*  1.20* 1.07* 1.16*  CALCIUM  8.5*  8.5* 8.4* 8.5*   LFT Recent Labs    10/13/24 1007  PROT 5.6*  ALBUMIN  3.2*  AST 27  ALT 16  ALKPHOS 164*  BILITOT 0.4   PT/INR No results for input(s): LABPROT, INR in the last 72 hours.  STUDIES: US  Venous Img Lower Bilateral (DVT) Result Date: 10/12/2024 CLINICAL DATA:  Bilateral lower extremity swelling. EXAM: Bilateral LOWER EXTREMITY VENOUS DOPPLER ULTRASOUND TECHNIQUE: Gray-scale sonography with compression, as well as color and duplex ultrasound, were performed to evaluate the deep venous system(s) from the level of the common femoral vein through the popliteal and proximal calf veins. COMPARISON:  None Available. FINDINGS: VENOUS  Normal compressibility of the common femoral, superficial femoral, and popliteal veins, as well as the visualized calf veins. Visualized portions of profunda femoral vein and great saphenous vein unremarkable. No filling defects to suggest DVT on grayscale or color Doppler imaging. Doppler waveforms show normal direction of venous flow, normal respiratory plasticity and response to augmentation. Limited views of the contralateral common femoral vein are unremarkable. OTHER Are mild bilateral calf subcutaneous edema. Limitations: none IMPRESSION: 1. No evidence of DVT. 2. Mild bilateral calf subcutaneous edema. Electronically Signed   By: Vanetta Chou M.D.   On: 10/12/2024 17:57   DG Chest 2 View Result Date: 10/12/2024 CLINICAL DATA:  leg swelling EXAM: CHEST - 2 VIEW COMPARISON:  July 03, 2024 June 09, 2021 FINDINGS: The cardiomediastinal silhouette is unchanged in contour.Flattening of the hemidiaphragms. No significant pleural effusion. No pneumothorax. No acute pleuroparenchymal abnormality. Visualized abdomen is unremarkable. Multilevel degenerative changes of the thoracic spine. IMPRESSION: No acute cardiopulmonary abnormality. Electronically Signed   By: Corean Salter M.D.   On: 10/12/2024 13:54       Impression / Plan:   63 y.o. lady with history of alcohol abuse, fatty liver, and anemia here with melenic stool. Has macrocytosis and elevated Ferritin but low iron . No hemodynamically significant bleeding.  - NPO at midnight for EGD tomorrow - IV PPI BID - daily cbc's - further recs after EGD tomorrow, Dr. Jinny will be covering starting tomorrow  Frederic Schick MD, MPH [facility]   -

## 2024-10-14 ENCOUNTER — Inpatient Hospital Stay: Admitting: Anesthesiology

## 2024-10-14 ENCOUNTER — Encounter: Payer: Self-pay | Admitting: Internal Medicine

## 2024-10-14 ENCOUNTER — Inpatient Hospital Stay

## 2024-10-14 ENCOUNTER — Encounter
Admission: EM | Disposition: A | Discharge status: Home Health | Payer: Self-pay | Source: Home / Self Care | Attending: Obstetrics and Gynecology

## 2024-10-14 DIAGNOSIS — K253 Acute gastric ulcer without hemorrhage or perforation: Secondary | ICD-10-CM

## 2024-10-14 DIAGNOSIS — K259 Gastric ulcer, unspecified as acute or chronic, without hemorrhage or perforation: Secondary | ICD-10-CM | POA: Diagnosis not present

## 2024-10-14 DIAGNOSIS — K2980 Duodenitis without bleeding: Secondary | ICD-10-CM | POA: Diagnosis not present

## 2024-10-14 DIAGNOSIS — K921 Melena: Secondary | ICD-10-CM | POA: Diagnosis not present

## 2024-10-14 HISTORY — PX: ESOPHAGOGASTRODUODENOSCOPY: SHX5428

## 2024-10-14 LAB — BASIC METABOLIC PANEL WITH GFR
Anion gap: 8 (ref 5–15)
BUN: 21 mg/dL (ref 8–23)
CO2: 26 mmol/L (ref 22–32)
Calcium: 8.2 mg/dL — ABNORMAL LOW (ref 8.9–10.3)
Chloride: 108 mmol/L (ref 98–111)
Creatinine, Ser: 1.26 mg/dL — ABNORMAL HIGH (ref 0.44–1.00)
GFR, Estimated: 47 mL/min — ABNORMAL LOW (ref >60)
Glucose, Bld: 76 mg/dL (ref 70–99)
Potassium: 4.7 mmol/L (ref 3.5–5.1)
Sodium: 142 mmol/L (ref 135–145)

## 2024-10-14 LAB — CBC
HCT: 24.2 % — ABNORMAL LOW (ref 36.0–46.0)
Hemoglobin: 8 g/dL — ABNORMAL LOW (ref 12.0–15.0)
MCH: 34.2 pg — ABNORMAL HIGH (ref 26.0–34.0)
MCHC: 33.1 g/dL (ref 30.0–36.0)
MCV: 103.4 fL — ABNORMAL HIGH (ref 80.0–100.0)
Platelets: 263 K/uL (ref 150–400)
RBC: 2.34 MIL/uL — ABNORMAL LOW (ref 3.87–5.11)
RDW: 19.4 % — ABNORMAL HIGH (ref 11.5–15.5)
WBC: 4.1 K/uL (ref 4.0–10.5)
nRBC: 0 % (ref 0.0–0.2)

## 2024-10-14 LAB — GLUCOSE, CAPILLARY
Glucose-Capillary: 73 mg/dL (ref 70–99)
Glucose-Capillary: 78 mg/dL (ref 70–99)

## 2024-10-14 SURGERY — EGD (ESOPHAGOGASTRODUODENOSCOPY)
Anesthesia: General

## 2024-10-14 MED ORDER — PROPOFOL 500 MG/50ML IV EMUL
INTRAVENOUS | Status: DC | PRN
  Administered 2024-10-14: 75 ug/kg/min via INTRAVENOUS

## 2024-10-14 MED ORDER — DEXMEDETOMIDINE HCL IN NACL 80 MCG/20ML IV SOLN
INTRAVENOUS | Status: DC | PRN
  Administered 2024-10-14: 12 ug via INTRAVENOUS
  Administered 2024-10-14: 8 ug via INTRAVENOUS

## 2024-10-14 MED ORDER — AMLODIPINE BESYLATE 10 MG PO TABS
10.0000 mg | ORAL_TABLET | Freq: Every day | ORAL | Status: DC
  Administered 2024-10-14 – 2024-10-15 (×2): 10 mg via ORAL
  Filled 2024-10-14 (×2): qty 1

## 2024-10-14 MED ORDER — LIDOCAINE HCL (CARDIAC) PF 100 MG/5ML IV SOSY
PREFILLED_SYRINGE | INTRAVENOUS | Status: DC | PRN
  Administered 2024-10-14: 60 mg via INTRAVENOUS

## 2024-10-14 MED ORDER — PROPOFOL 10 MG/ML IV BOLUS
INTRAVENOUS | Status: DC | PRN
  Administered 2024-10-14 (×2): 50 mg via INTRAVENOUS

## 2024-10-14 MED ORDER — GLYCOPYRROLATE 0.2 MG/ML IJ SOLN
INTRAMUSCULAR | Status: DC | PRN
  Administered 2024-10-14: .2 mg via INTRAVENOUS

## 2024-10-14 MED ORDER — CYANOCOBALAMIN 1000 MCG/ML IJ SOLN
1000.0000 ug | Freq: Once | INTRAMUSCULAR | Status: DC
Start: 2024-10-14 — End: 2024-10-15
  Filled 2024-10-14: qty 1

## 2024-10-14 MED ORDER — PANTOPRAZOLE SODIUM 40 MG PO TBEC
40.0000 mg | DELAYED_RELEASE_TABLET | Freq: Two times a day (BID) | ORAL | Status: DC
  Administered 2024-10-14 – 2024-10-15 (×2): 40 mg via ORAL
  Filled 2024-10-14 (×2): qty 1

## 2024-10-14 MED ORDER — LIDOCAINE HCL (PF) 2 % IJ SOLN
INTRAMUSCULAR | Status: AC
Start: 2024-10-14 — End: 2024-10-14
  Filled 2024-10-14: qty 5

## 2024-10-14 MED ORDER — HALOPERIDOL LACTATE 5 MG/ML IJ SOLN
5.0000 mg | Freq: Once | INTRAMUSCULAR | Status: AC
  Administered 2024-10-14: 5 mg via INTRAVENOUS
  Filled 2024-10-14: qty 1

## 2024-10-14 MED ORDER — SODIUM CHLORIDE 0.9 % IV SOLN: INTRAVENOUS | Status: DC | PRN

## 2024-10-14 MED ORDER — MIRTAZAPINE 15 MG PO TABS
30.0000 mg | ORAL_TABLET | Freq: Every day | ORAL | Status: DC
  Administered 2024-10-14: 30 mg via ORAL
  Filled 2024-10-14: qty 2

## 2024-10-14 NOTE — Care Management Important Message (Signed)
 Important Message  Patient Details  Name: KAYTON RIPP MRN: [id number] Date of Birth: 1961/05/27   Important Message Given:  Yes - Medicare IM     Rojelio SHAUNNA Rattler 10/14/2024, 4:30 PM

## 2024-10-14 NOTE — BH Assessment (Signed)
 Pt  placed under  IVC  papers

## 2024-10-14 NOTE — Progress Notes (Signed)
 The patient underwent an EGD today with some healed gastric ulcers without any sign of any recent or active bleeding.  The patient should avoid NSAIDs and abstain from alcohol.  Nothing further to do from a GI point of view.  I have advanced her diet.  I will sign off.  Please call if any further GI concerns or questions.  We would like to thank you for the opportunity to participate in the care of Jamie Wilkins.

## 2024-10-14 NOTE — Progress Notes (Signed)
 Pt is combative and is refusing to take her meds. She is requesting to go home and insists that she has been in the hospital for the past 2 months and needs to go home. Repeatedly stating that she is not a patient and not sure how she got here without signing papers. MD made aware and security has been called.

## 2024-10-14 NOTE — Progress Notes (Signed)
   10/14/24 1445  Spiritual Encounters  Type of Visit Initial  Care provided to: Patient  Referral source Nurse (RN/NT/LPN)  Reason for visit Routine spiritual support  OnCall Visit No  Spiritual Framework  Presenting Themes Meaning/purpose/sources of inspiration  Interventions  Spiritual Care Interventions Made Established relationship of care and support;Compassionate presence;Reflective listening;Encouragement  Intervention Outcomes  Outcomes Connection to spiritual care;Reduced isolation   Pt kept saying she was going to get up and go to the kitchen to fix her some lunch.

## 2024-10-14 NOTE — TOC CM/SW Note (Signed)
 Transition of Care [facility]) - Inpatient Brief Assessment   Patient Details  Name: Jamie Wilkins MRN: [id number] Date of Birth: 05-07-1961  Transition of Care [facility]) CM/SW Contact:    Lauraine JAYSON Carpen, LCSW Phone Number: 10/14/2024, 9:33 AM   Clinical Narrative: CSW reviewed chart. No TOC needs identified so far. CSW will continue to follow progress. Place TOC consult if any needs arise.  Transition of Care Asessment: Insurance and Status: Insurance coverage has been reviewed Patient has primary care physician: Yes Home environment has been reviewed: Mobile home Prior level of function:: Not documented Prior/Current Home Services: No current home services Social Drivers of Health Review: SDOH reviewed no interventions necessary Readmission risk has been reviewed: Yes Transition of care needs: no transition of care needs at this time

## 2024-10-14 NOTE — Plan of Care (Signed)
  Problem: Education: Goal: Knowledge of General Education information will improve Description Including pain rating scale, medication(s)/side effects and non-pharmacologic comfort measures Outcome: Progressing   Problem: Health Behavior/Discharge Planning: Goal: Ability to manage health-related needs will improve Outcome: Progressing   Problem: Clinical Measurements: Goal: Respiratory complications will improve Outcome: Progressing Goal: Cardiovascular complication will be avoided Outcome: Progressing   Problem: Safety: Goal: Ability to remain free from injury will improve Outcome: Progressing   

## 2024-10-14 NOTE — Progress Notes (Signed)
 Called to patient's room, she is agitated, security is here. She is oriented to person and place but not year, is now aware why she is here nor what happened today (including that she had a procedure today). Lacks capacity and cannot leave, will IVC if needed, appears to be suffering from hospital delirium, will give haldol  5

## 2024-10-14 NOTE — Transfer of Care (Signed)
 Immediate Anesthesia Transfer of Care Note  Patient: Jamie Wilkins  Procedure(s) Performed: EGD (ESOPHAGOGASTRODUODENOSCOPY)  Patient Location: PACU  Anesthesia Type:General  Level of Consciousness: sedated  Airway & Oxygen Therapy: Patient Spontanous Breathing  Post-op Assessment: Report given to RN and Post -op Vital signs reviewed and stable  Post vital signs: Reviewed and stable  Last Vitals:  Vitals Value Taken Time  BP 184/96 10/14/24 13:05  Temp 36.3 C 10/14/24 13:05  Pulse 82 10/14/24 13:10  Resp 14 10/14/24 13:10  SpO2 93 % 10/14/24 13:10  Vitals shown include unfiled device data.  Last Pain:  Vitals:   10/14/24 1305  TempSrc: Temporal  PainSc: 0-No pain      Patients Stated Pain Goal: 2 (10/12/24 1320)  Complications: No notable events documented.

## 2024-10-14 NOTE — Anesthesia Preprocedure Evaluation (Signed)
 Anesthesia Evaluation  Patient identified by MRN, date of birth, ID band Patient awake and Patient confused    Reviewed: Allergy & Precautions, NPO status , Patient's Chart, lab work & pertinent test results  Airway Mallampati: II  TM Distance: >3 FB Neck ROM: full    Dental  (+) Missing   Pulmonary neg pulmonary ROS, asthma , COPD,  COPD inhaler, Current Smoker and Patient abstained from smoking.   Pulmonary exam normal  + decreased breath sounds      Cardiovascular Exercise Tolerance: Poor hypertension, Pt. on medications negative cardio ROS Normal cardiovascular exam Rhythm:Regular Rate:Normal     Neuro/Psych    Depression    negative neurological ROS  negative psych ROS   GI/Hepatic negative GI ROS, Neg liver ROS,,,  Endo/Other  negative endocrine ROS  Class 3 obesity  Renal/GU negative Renal ROS  negative genitourinary   Musculoskeletal   Abdominal   Peds negative pediatric ROS (+)  Hematology negative hematology ROS (+) Blood dyscrasia, anemia   Anesthesia Other Findings Past Medical History: No date: Asthma No date: Heavy menstrual period No date: Hypertension  Past Surgical History: No date: ABDOMINAL HYSTERECTOMY     Comment:  partial 06/12/2021: COLONOSCOPY; N/A     Comment:  Procedure: COLONOSCOPY;  Surgeon: Unk Corinn Skiff,               MD;  Location: [facility] ENDOSCOPY;  Service:               Gastroenterology;  Laterality: N/A; 06/10/2021: ESOPHAGOGASTRODUODENOSCOPY; N/A     Comment:  Procedure: ESOPHAGOGASTRODUODENOSCOPY (EGD);  Surgeon:               Toledo, Ladell POUR, MD;  Location: [facility] ENDOSCOPY;                Service: Gastroenterology;  Laterality: N/A; No date: FOOT SURGERY 06/12/2021: GIVENS CAPSULE STUDY; N/A     Comment:  Procedure: GIVENS CAPSULE STUDY;  Surgeon: Unk Corinn Skiff, MD;  Location: [facility] ENDOSCOPY;  Service:               Gastroenterology;  Laterality:  N/A; No date: S/P BTL  BMI    Body Mass Index: 18.37 kg/m      Reproductive/Obstetrics negative OB ROS                              Anesthesia Physical Anesthesia Plan  ASA: 3  Anesthesia Plan: General   Post-op Pain Management:    Induction: Intravenous  PONV Risk Score and Plan: Propofol  infusion and TIVA  Airway Management Planned: Natural Airway and Nasal Cannula  Additional Equipment:   Intra-op Plan:   Post-operative Plan:   Informed Consent: I have reviewed the patients History and Physical, chart, labs and discussed the procedure including the risks, benefits and alternatives for the proposed anesthesia with the patient or authorized representative who has indicated his/her understanding and acceptance.     Dental Advisory Given  Plan Discussed with: CRNA  Anesthesia Plan Comments:         Anesthesia Quick Evaluation

## 2024-10-14 NOTE — Op Note (Signed)
 [facility] Gastroenterology Patient Name: Jamie Wilkins Procedure Date: 10/14/2024 12:47 PM MRN: [id number] Account #: [id number] Date of Birth: 1961/11/19 Admit Type: Inpatient Age: 63 Room: [facility] ENDO ROOM 4 Gender: Female Note Status: Finalized Instrument Name: Barnie GI Scope [id number] Procedure:             Upper GI endoscopy Indications:           Melena Providers:             Rogelia Copping MD, MD Referring MD:          [facility], MD (Referring MD) Medicines:             Propofol  per Anesthesia Complications:         No immediate complications. Procedure:             Pre-Anesthesia Assessment:                        - Prior to the procedure, a History and Physical was                         performed, and patient medications and allergies were                         reviewed. The patient's tolerance of previous                         anesthesia was also reviewed. The risks and benefits                         of the procedure and the sedation options and risks                         were discussed with the patient. All questions were                         answered, and informed consent was obtained. Prior                         Anticoagulants: The patient has taken no anticoagulant                         or antiplatelet agents. ASA Grade Assessment: II - A                         patient with mild systemic disease. After reviewing                         the risks and benefits, the patient was deemed in                         satisfactory condition to undergo the procedure.                        After obtaining informed consent, the endoscope was                         passed under direct vision. Throughout the procedure,  the patient's blood pressure, pulse, and oxygen                         saturations were monitored continuously. The Endoscope                         was introduced through the  mouth, and advanced to the                         second part of duodenum. The upper GI endoscopy was                         accomplished without difficulty. The patient tolerated                         the procedure well. Findings:      The examined esophagus was normal.      Few non-bleeding cratered gastric ulcers with no stigmata of bleeding       were found in the gastric antrum.      Mild inflammation was found in the duodenal bulb. Impression:            - Normal esophagus.                        - Non-bleeding gastric ulcers with no stigmata of                         bleeding.                        - Duodenitis.                        - No specimens collected. Recommendation:        - Return patient to hospital ward for ongoing care.                        - Resume regular diet.                        - Continue present medications.                        - Treat with a PPI                        Nothing further to do from a GI point of view.                        Will sign off now.                        Please call with any questions. Procedure Code(s):     --- Professional ---                        [phone], Esophagogastroduodenoscopy, flexible,                         transoral; diagnostic, including collection of  specimen(s) by brushing or washing, when performed                         (separate procedure) Diagnosis Code(s):     --- Professional ---                        K92.1, Melena (includes Hematochezia)                        K25.9, Gastric ulcer, unspecified as acute or chronic,                         without hemorrhage or perforation CPT copyright 2022 American Medical Association. All rights reserved. The codes documented in this report are preliminary and upon coder review may  be revised to meet current compliance requirements. Rogelia Copping MD, MD 10/14/2024 1:08:04 PM This report has been signed electronically. Number of  Addenda: 0 Note Initiated On: 10/14/2024 12:47 PM Estimated Blood Loss:  Estimated blood loss: none.      [facility]

## 2024-10-14 NOTE — Progress Notes (Signed)
 PROGRESS NOTE    Jamie Wilkins  FMW:[id number] DOB: Apr 11, 1961 DOA: 10/12/2024 PCP: [facility]  Outpatient Specialists: none    Brief Narrative:   Jamie Wilkins is a 63 y.o. 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     Pt Has been having black stools for last week but on further questioning states last 2-3 days. Swelling started 2 days ago. No shortness of breath. Right foot with pain and more swelling than left. States her feet are usually thin. Denies any infectious symptoms and denies any hematemesis, nausea or vomiting.    On arrival to the ED patient was noted to be HDS stable.  Lab work and imaging obtained.  CBC with hemoglobin at 8.8 from baseline but appears to be normal.  BMP with stable renal function.  Mild hypocalcemia.  Troponin mildly elevated at 47, with repeat pending.  Given the drop in hemoglobin, [facility] contacted for admission.   Assessment & Plan:   Principal Problem:   GI bleeding Active Problems:   Alcoholic hepatitis without ascites (HCC)   AF (paroxysmal atrial fibrillation) (HCC)   Alcohol use disorder in remission   Essential hypertension   CKD stage 3a, GFR 45-59 ml/min (HCC)   Acute gastric ulcer without hemorrhage or perforation   # Macrocytic anemia # B12 deficiency # Folic acid  deficiency Hgb stable in the 8s, was 11-14s earlier this year. Does report melena. Hx ischemic colitis in 2022, currently no pain or diarrhea so doubt that. Denies nsaids. B12 and folate both low. EGD today with a few non-bleeding gastric ulcers - start b12 and folic acid  supplementation - continue pantop bid - gi to see - continue IV ppi - trend hgb  # Lower extremity edema Mild. Admitter checked u/s which shows no dvt. No bnp  but TTE ordered, normalLFTs norma. TSH wnl  # Alcohol use disorder # Alcohol liver disease Reports abstinence 3 months, no s/s withdrawal. 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 Sinus on EKG - monitor  # HTN Bp elevated - start amlodipine   # Mild cognitive impairment Vs dementia. Calm. Likely at baseline. B12/folate low - rpr - CT head  # MDD - home mirtazapine   # Debility Son has voiced concerns about functional abilities at home - pt/ot consults   DVT prophylaxis: scds Code Status: full Family Communication: daughter pandita updated telephonically 11/24  Level of care: Progressive Status is: Inpatient Remains inpatient appropriate because: severity of illness    Consultants:  GI  Procedures: None thus far  Antimicrobials:  none    Subjective: Reports feeling well, no abd pain, no nausea, tolerating diet, breathing comfortably, tolerated procedure  Objective: Vitals:   10/14/24 1314 10/14/24 1315 10/14/24 1325 10/14/24 1326  BP: (!) 172/96 (!) 171/91 (!) 166/87   Pulse: 76 75 74 71  Resp: (!) 21 (!) 25 18 14   Temp:      TempSrc:      SpO2: 99% 96% 100% 100%  Weight:        Intake/Output Summary (Last 24 hours) at 10/14/2024 1435 Last data filed at 10/14/2024 1302 Gross per 24 hour  Intake 100 ml  Output 0 ml  Net 100 ml   Filed Weights   10/13/24 0500  10/14/24 0500  Weight: 53.2 kg 53.2 kg    Examination:  General exam: Appears calm and comfortable  Respiratory system: Clear to auscultation. Respiratory effort normal. Cardiovascular system: S1 & S2 heard, RRR. No JVD, murmurs, rubs, gallops or clicks. No pedal edema. Gastrointestinal system: Abdomen is nondistended, soft and nontender. No organomegaly or masses felt. Normal bowel sounds heard. Central nervous system: Alert and oriented. No focal neurological deficits. Extremities: Symmetric 5 x 5 power. Skin: No rashes, lesions or  ulcers Psychiatry: Judgement and insight appear normal. Mood & affect appropriate.     Data Reviewed: I have personally reviewed following labs and imaging studies  CBC: Recent Labs  Lab 10/12/24 1323 10/13/24 0017 10/13/24 0545 10/14/24 0426  WBC 5.0  --  3.7* 4.1  HGB 8.8* 8.4* 8.3* 8.0*  HCT 27.2* 26.7* 25.1* 24.2*  MCV 103.8*  --  102.0* 103.4*  PLT 246  --  252 263   Basic Metabolic Panel: Recent Labs  Lab 10/12/24 1323 10/13/24 0545 10/13/24 1007 10/14/24 0426  NA 137  136 140 140 142  K 4.2  4.2 4.1 4.1 4.7  CL 104  102 106 104 108  CO2 22  23 26 26 26   GLUCOSE 81  83 60* 120* 76  BUN 20  18 18 18 21   CREATININE 1.19*  1.20* 1.07* 1.16* 1.26*  CALCIUM  8.5*  8.5* 8.4* 8.5* 8.2*  MG 1.6*  --   --   --    GFR: Estimated Creatinine Clearance: 37.9 mL/min (A) (by C-G formula based on SCr of 1.26 mg/dL (H)). Liver Function Tests: Recent Labs  Lab 10/12/24 1323 10/13/24 1007  AST 33 27  ALT 18 16  ALKPHOS 176* 164*  BILITOT 0.4 0.4  PROT 6.3* 5.6*  ALBUMIN  3.2* 3.2*   No results for input(s): LIPASE, AMYLASE in the last 168 hours. No results for input(s): AMMONIA in the last 168 hours. Coagulation Profile: No results for input(s): INR, PROTIME in the last 168 hours. Cardiac Enzymes: No results for input(s): CKTOTAL, CKMB, CKMBINDEX, TROPONINI in the last 168 hours. BNP (last 3 results) No results for input(s): PROBNP in the last 8760 hours. HbA1C: No results for input(s): HGBA1C in the last 72 hours. CBG: Recent Labs  Lab 10/13/24 0944 10/14/24 0759 10/14/24 1326  GLUCAP 71 73 78   Lipid Profile: No results for input(s): CHOL, HDL, LDLCALC, TRIG, CHOLHDL, LDLDIRECT in the last 72 hours. Thyroid Function Tests: Recent Labs    10/13/24 1007  TSH 1.640   Anemia Panel: Recent Labs    10/12/24 1323 10/13/24 1007  VITAMINB12  --  246  FOLATE  --  4.5*  FERRITIN 533*  --   TIBC 221*  --   IRON  26*   --    Urine analysis:    Component Value Date/Time   COLORURINE YELLOW 07/03/2024 1820   APPEARANCEUR TURBID (A) 07/03/2024 1820   LABSPEC 1.012 07/03/2024 1820   PHURINE 6.0 07/03/2024 1820   GLUCOSEU NEGATIVE 07/03/2024 1820   HGBUR MODERATE (A) 07/03/2024 1820   BILIRUBINUR NEGATIVE 07/03/2024 1820   KETONESUR 5 (A) 07/03/2024 1820   PROTEINUR 100 (A) 07/03/2024 1820   NITRITE POSITIVE (A) 07/03/2024 1820   LEUKOCYTESUR LARGE (A) 07/03/2024 1820   Sepsis Labs: @LABRCNTIP (procalcitonin:4,lacticidven:4)  )No results found for this or any previous visit (from the past 240 hours).       Radiology Studies: ECHOCARDIOGRAM COMPLETE Result Date: 10/13/2024    ECHOCARDIOGRAM REPORT   Patient Name:  Jamie Wilkins Date of Exam: 10/13/2024 Medical Rec #:  [id number]          Height:       67.0 in Accession #:    [id number]         Weight:       117.3 lb Date of Birth:  1961-11-08           BSA:          1.612 m Patient Age:    63 years           BP:           139/97 mmHg Patient Gender: F                  HR:           91 bpm. Exam Location:  [facility] Procedure: 2D Echo, Cardiac Doppler, Color Doppler and Strain Analysis (Both            Spectral and Color Flow Doppler were utilized during procedure). Indications:     Elevated Troponin  History:         Patient has prior history of Echocardiogram examinations, most                  recent 02/16/2021.  Sonographer:     Thedora Louder RDCS, FASE Referring Phys:  [id number] [facility] Diagnosing Phys: Denyse Bathe  Sonographer Comments: Global longitudinal strain was attempted. IMPRESSIONS  1. Left ventricular ejection fraction, by estimation, is 60 to 65%. The left ventricle has normal function. The left ventricle has no regional wall motion abnormalities. There is mild concentric left ventricular hypertrophy. Left ventricular diastolic parameters are consistent with Grade I diastolic dysfunction (impaired relaxation).  2. Right ventricular  systolic function is normal. The right ventricular size is normal.  3. The mitral valve is normal in structure. No evidence of mitral valve regurgitation. No evidence of mitral stenosis.  4. The aortic valve is normal in structure. Aortic valve regurgitation is not visualized. No aortic stenosis is present.  5. The inferior vena cava is normal in size with greater than 50% respiratory variability, suggesting right atrial pressure of 3 mmHg. FINDINGS  Left Ventricle: Left ventricular ejection fraction, by estimation, is 60 to 65%. The left ventricle has normal function. The left ventricle has no regional wall motion abnormalities. Strain was performed and the global longitudinal strain is indeterminate. The left ventricular internal cavity size was normal in size. There is mild concentric left ventricular hypertrophy. Left ventricular diastolic parameters are consistent with Grade I diastolic dysfunction (impaired relaxation). Right Ventricle: The right ventricular size is normal. No increase in right ventricular wall thickness. Right ventricular systolic function is normal. Left Atrium: Left atrial size was normal in size. Right Atrium: Right atrial size was normal in size. Pericardium: There is no evidence of pericardial effusion. Mitral Valve: The mitral valve is normal in structure. No evidence of mitral valve regurgitation. No evidence of mitral valve stenosis. Tricuspid Valve: The tricuspid valve is normal in structure. Tricuspid valve regurgitation is mild . No evidence of tricuspid stenosis. Aortic Valve: The aortic valve is normal in structure. Aortic valve regurgitation is not visualized. No aortic stenosis is present. Aortic valve peak gradient measures 3.5 mmHg. Pulmonic Valve: The pulmonic valve was normal in structure. Pulmonic valve regurgitation is not visualized. No evidence of pulmonic stenosis. Aorta: The aortic root is normal in size and structure. Venous: The inferior vena cava is normal in size  with greater than 50% respiratory variability, suggesting right atrial pressure of 3 mmHg. IAS/Shunts: No atrial level shunt detected by color flow Doppler. Additional Comments: 3D was performed not requiring image post processing on an independent workstation and was indeterminate.  LEFT VENTRICLE PLAX 2D LVIDd:         3.70 cm   Diastology LVIDs:         2.60 cm   LV e' medial:    7.51 cm/s LV PW:         1.20 cm   LV E/e' medial:  9.2 LV IVS:        1.30 cm   LV e' lateral:   11.00 cm/s LVOT diam:     2.00 cm   LV E/e' lateral: 6.3 LV SV:         48 LV SV Index:   30 LVOT Area:     3.14 cm  RIGHT VENTRICLE RV Basal diam:  2.60 cm RV S prime:     15.30 cm/s TAPSE (M-mode): 1.6 cm LEFT ATRIUM           Index       RIGHT ATRIUM           Index LA diam:      2.80 cm 1.74 cm/m  RA Area:     15.60 cm LA Vol (A2C): 11.6 ml 7.20 ml/m  RA Volume:   43.30 ml  26.86 ml/m LA Vol (A4C): 6.7 ml  4.16 ml/m  AORTIC VALVE                 PULMONIC VALVE AV Area (Vmax): 3.07 cm     PV Vmax:        0.83 m/s AV Vmax:        93.00 cm/s   PV Peak grad:   2.8 mmHg AV Peak Grad:   3.5 mmHg     RVOT Peak grad: 2 mmHg LVOT Vmax:      91.00 cm/s LVOT Vmean:     57.500 cm/s LVOT VTI:       0.153 m  AORTA Ao Root diam: 3.00 cm MITRAL VALVE               TRICUSPID VALVE MV Area (PHT): 3.99 cm    TR Peak grad:   35.3 mmHg MV Decel Time: 190 msec    TR Vmax:        297.00 cm/s MV E velocity: 69.20 cm/s MV A velocity: 73.40 cm/s  SHUNTS MV E/A ratio:  0.94        Systemic VTI:  0.15 m                            Systemic Diam: 2.00 cm Denyse Bathe Electronically signed by Denyse Bathe Signature Date/Time: 10/13/2024/1:26:37 PM    Final    US  Venous Img Lower Bilateral (DVT) Result Date: 10/12/2024 CLINICAL DATA:  Bilateral lower extremity swelling. EXAM: Bilateral LOWER EXTREMITY VENOUS DOPPLER ULTRASOUND TECHNIQUE: Gray-scale sonography with compression, as well as color and duplex ultrasound, were performed to evaluate the deep  venous system(s) from the level of the common femoral vein through the popliteal and proximal calf veins. COMPARISON:  None Available. FINDINGS: VENOUS Normal compressibility of the common femoral, superficial femoral, and popliteal veins, as well as the visualized calf veins. Visualized portions of profunda femoral vein and great saphenous vein unremarkable. No filling defects to suggest DVT on grayscale  or color Doppler imaging. Doppler waveforms show normal direction of venous flow, normal respiratory plasticity and response to augmentation. Limited views of the contralateral common femoral vein are unremarkable. OTHER Are mild bilateral calf subcutaneous edema. Limitations: none IMPRESSION: 1. No evidence of DVT. 2. Mild bilateral calf subcutaneous edema. Electronically Signed   By: Vanetta Chou M.D.   On: 10/12/2024 17:57        Scheduled Meds:  folic acid   1 mg Oral Daily   pantoprazole  (PROTONIX ) IV  40 mg Intravenous Q12H   sodium chloride  flush  3 mL Intravenous Q12H   thiamine   100 mg Oral Daily   Continuous Infusions:   LOS: 2 days     Devaughn KATHEE Ban, MD [facility]   If 7PM-7AM, please contact night-coverage [email or web address] Password [email or web address] 10/14/2024, 2:35 PM

## 2024-10-15 ENCOUNTER — Other Ambulatory Visit: Payer: Self-pay

## 2024-10-15 DIAGNOSIS — K921 Melena: Secondary | ICD-10-CM | POA: Diagnosis not present

## 2024-10-15 LAB — CBC
HCT: 27.2 % — ABNORMAL LOW (ref 36.0–46.0)
Hemoglobin: 8.4 g/dL — ABNORMAL LOW (ref 12.0–15.0)
MCH: 33.6 pg (ref 26.0–34.0)
MCHC: 30.9 g/dL (ref 30.0–36.0)
MCV: 108.8 fL — ABNORMAL HIGH (ref 80.0–100.0)
Platelets: 259 K/uL (ref 150–400)
RBC: 2.5 MIL/uL — ABNORMAL LOW (ref 3.87–5.11)
RDW: 19.2 % — ABNORMAL HIGH (ref 11.5–15.5)
WBC: 3.9 K/uL — ABNORMAL LOW (ref 4.0–10.5)
nRBC: 0 % (ref 0.0–0.2)

## 2024-10-15 LAB — BASIC METABOLIC PANEL WITH GFR
Anion gap: 9 (ref 5–15)
BUN: 15 mg/dL (ref 8–23)
CO2: 27 mmol/L (ref 22–32)
Calcium: 8.8 mg/dL — ABNORMAL LOW (ref 8.9–10.3)
Chloride: 106 mmol/L (ref 98–111)
Creatinine, Ser: 1.08 mg/dL — ABNORMAL HIGH (ref 0.44–1.00)
GFR, Estimated: 57 mL/min — ABNORMAL LOW (ref >60)
Glucose, Bld: 76 mg/dL (ref 70–99)
Potassium: 4.7 mmol/L (ref 3.5–5.1)
Sodium: 142 mmol/L (ref 135–145)

## 2024-10-15 LAB — SYPHILIS: RPR W/REFLEX TO RPR TITER AND TREPONEMAL ANTIBODIES, TRADITIONAL SCREENING AND DIAGNOSIS ALGORITHM: RPR Ser Ql: NONREACTIVE

## 2024-10-15 MED ORDER — PANTOPRAZOLE SODIUM 40 MG PO TBEC
40.0000 mg | DELAYED_RELEASE_TABLET | Freq: Two times a day (BID) | ORAL | 1 refills | Status: AC
  Filled 2024-10-15: qty 180, 90d supply, fill #0

## 2024-10-15 MED ORDER — VITAMIN B-1 100 MG PO TABS
100.0000 mg | ORAL_TABLET | Freq: Every day | ORAL | 1 refills | Status: AC
  Filled 2024-10-15: qty 90, 90d supply, fill #0

## 2024-10-15 MED ORDER — AMLODIPINE BESYLATE 10 MG PO TABS
10.0000 mg | ORAL_TABLET | Freq: Every day | ORAL | 1 refills | Status: AC
  Filled 2024-10-15: qty 90, 90d supply, fill #0

## 2024-10-15 MED ORDER — FOLIC ACID 1 MG PO TABS
1.0000 mg | ORAL_TABLET | Freq: Every day | ORAL | 1 refills | Status: AC
  Filled 2024-10-15: qty 90, 90d supply, fill #0

## 2024-10-15 MED ORDER — VITAMIN B-12 1000 MCG PO TABS
1000.0000 ug | ORAL_TABLET | Freq: Every day | ORAL | 1 refills | Status: AC
Start: 2024-10-15 — End: ?
  Filled 2024-10-15: qty 90, 90d supply, fill #0

## 2024-10-15 NOTE — TOC Initial Note (Signed)
 Transition of Care [facility]) - Initial/Assessment Note    Patient Details  Name: Jamie Wilkins MRN: [id number] Date of Birth: 1960-12-18  Transition of Care [facility]) CM/SW Contact:    Jamie JAYSON Carpen, LCSW Phone Number: 10/15/2024, 11:16 AM  Clinical Narrative:  CSW met with patient. No family at bedside but NT is present. CSW introduced role and explained that therapy recommendations would be discussed. Patient is agreeable to home health. Will send out referral and follow up with those that can accept her. Per RN, MD spoke to daughter and she is working on arranging transportation and for someone to stay with her.                 Expected Discharge Plan: Home w Home Health Services Barriers to Discharge: Continued Medical Work up   Patient Goals and CMS Choice            Expected Discharge Plan and Services     Post Acute Care Choice: Home Health Living arrangements for the past 2 months: [place] Home Expected Discharge Date: 10/15/24                                    Prior Living Arrangements/Services Living arrangements for the past 2 months: [place] Home Lives with:: Self Patient language and need for interpreter reviewed:: Yes Do you feel safe going back to the place where you live?: Yes      Need for Family Participation in Patient Care: Yes (Comment) Care giver support system in place?: Yes (comment)   Criminal Activity/Legal Involvement Pertinent to Current Situation/Hospitalization: No - Comment as needed  Activities of Daily Living      Permission Sought/Granted Permission sought to share information with : Facility Industrial/product Designer granted to share information with : Yes, Verbal Permission Granted     Permission granted to share info w AGENCY: Home Health Agencies        Emotional Assessment Appearance:: Appears stated age Attitude/Demeanor/Rapport: Engaged, Gracious Affect (typically observed): Accepting, Appropriate, Calm,  Pleasant Orientation: : Oriented to Self Alcohol / Substance Use: Not Applicable Psych Involvement: No (comment)  Admission diagnosis:  GI bleeding [K92.2] Peripheral edema [R60.0] Elevated troponin [R79.89] Gastrointestinal hemorrhage, unspecified gastrointestinal hemorrhage type [K92.2] Anemia, unspecified type [D64.9] Patient Active Problem List   Diagnosis Date Noted   Acute gastric ulcer without hemorrhage or perforation 10/14/2024   CKD stage 3a, GFR 45-59 ml/min (HCC) 10/13/2024   GI bleeding 10/12/2024   Alcohol use disorder in remission 10/12/2024   Essential hypertension 10/12/2024   Alcohol use 01/19/2024   Delusions (HCC) 09/16/2021   Depression due to physical illness 08/31/2021   Decubitus ulcer 08/31/2021   Severe major depression, single episode, without psychotic features (HCC) 08/31/2021   Hypotension 08/30/2021   History of ischemic colitis 07/28/2021   PAC (premature atrial contraction)    Anemia    Atrial fibrillation with RVR (HCC) 07/20/2021   Colitis 06/12/2021   Melena    AF (paroxysmal atrial fibrillation) (HCC) 06/10/2021   Protein-calorie malnutrition, severe 06/10/2021   Hyponatremia 06/09/2021   Symptomatic anemia 06/09/2021   Malnutrition of moderate degree 02/17/2021   Syncope    Elevated lipase    Electrolyte abnormality    Chest pain 02/15/2021   Hypocalcemia    Hypokalemia    Hypomagnesemia    Alcoholic hepatitis without ascites [facility])    PCP:  [facility]:  [facility] [address], [place] - [address] [place] [address] Phone: [phone] Fax: [phone]  [facility] - [place], [place] - [address] [place] [address] Phone: [phone] Fax: [phone]  [facility] [address], [place] - [address] [place] [address] Phone: [phone] Fax: [phone]  [facility] [address] [place] [address] Phone: [phone] Fax: [phone]     Social Drivers of Health (SDOH) Social History: SDOH Screenings   Food Insecurity: Food Insecurity Present (07/30/2021)   Received from [facility]  Transportation Needs: No Transportation Needs (07/30/2021)   Received from [facility]  Financial Resource Strain: Low Risk (07/30/2021)   Received from [facility]  Tobacco Use: High Risk (10/14/2024)   SDOH Interventions:     Readmission Risk Interventions     No data to display

## 2024-10-15 NOTE — Anesthesia Postprocedure Evaluation (Signed)
 Anesthesia Post Note  Patient: Jamie Wilkins  Procedure(s) Performed: EGD (ESOPHAGOGASTRODUODENOSCOPY)  Patient location during evaluation: PACU Anesthesia Type: General Level of consciousness: awake Pain management: satisfactory to patient Vital Signs Assessment: post-procedure vital signs reviewed and stable Respiratory status: spontaneous breathing Cardiovascular status: stable Anesthetic complications: no   No notable events documented.   Last Vitals:  Vitals:   10/14/24 2328 10/15/24 0416  BP: (!) 153/92 (!) 189/84  Pulse: 93 84  Resp: 20 19  Temp: 36.7 C 36.8 C  SpO2: 99% 98%    Last Pain:  Vitals:   10/15/24 0416  TempSrc: Oral  PainSc:                  VAN STAVEREN,Karlin Binion

## 2024-10-15 NOTE — TOC Transition Note (Signed)
 Transition of Care [facility]) - Discharge Note   Patient Details  Name: Jamie Wilkins MRN: [id number] Date of Birth: 1961-06-02  Transition of Care [facility]) CM/SW Contact:  Lauraine JAYSON Carpen, LCSW Phone Number: 10/15/2024, 1:39 PM   Clinical Narrative:   Patient has orders to discharge home today. Only [facility] has offered. Patient is aware and agreeable. No further concerns. CSW signing off.  Final next level of care: Home w Home Health Services Barriers to Discharge: Barriers Resolved   Patient Goals and CMS Choice     Choice offered to / list presented to : Patient      Discharge Placement                Patient to be transferred to facility by: Daughter is sending someone to pick her up.   Patient and family notified of of transfer: 10/15/24  Discharge Plan and Services Additional resources added to the After Visit Summary for       Post Acute Care Choice: Home Health                    HH Arranged: PT, OT, Nurse's Aide HH Agency: [facility] Date [facility] Agency Contacted: 10/15/24   Representative spoke with at [facility] Agency: Shaun  Social Drivers of Health (SDOH) Interventions SDOH Screenings   Food Insecurity: Food Insecurity Present (07/30/2021)   [facility]  Transportation Needs: No Transportation Needs (07/30/2021)   Received from [facility]  Financial Resource Strain: Low Risk (07/30/2021)   Received from [facility]  Tobacco Use: High Risk (10/14/2024)     Readmission Risk Interventions     No data to display

## 2024-10-15 NOTE — Plan of Care (Signed)

## 2024-10-15 NOTE — Evaluation (Signed)
 Physical Therapy Evaluation Patient Details Name: Jamie Wilkins MRN: [id number] DOB: [birth year] Today's Date: 10/15/2024  History of Present Illness  62 y.o. 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  Clinical Impression  Pt is a pleasant 63 y.o. female admitted d/t a GI bleed. Pt was greeted by PT as she was walking with mod I in the hall with sitter and no AD. Pt was alert and agreeable to practicing higher-level balance during PT evaluation. She was able to complete tandem stance each side for 30 seconds while using fingertip balance at the sink; single leg balance each side for 30 seconds with bilat fingertip balance at sink; modified tandem stance with head turns left/right/up/down on each side with bilat fingertip balance; tandem walks for 10 ft at bedside. Pt was CGA for all activities performed and no true LOB throughout. Pt demonstrates the ability to improve functional balance with continued PT services. Pt left with sitter in room and about to change her own gown as it was wet. Continue to work on dynamic balance and functional mobility to maintain function after hospital stay.       If plan is discharge home, recommend the following: A little help with walking and/or transfers   Can travel by private vehicle        Equipment Recommendations None recommended by PT  Recommendations for Other Services       Functional Status Assessment Patient has had a recent decline in their functional status and demonstrates the ability to make significant improvements in function in a reasonable and predictable amount of time.     Precautions / Restrictions Precautions Precautions: Fall Recall of Precautions/Restrictions: Intact Restrictions Weight Bearing Restrictions Per  Provider Order: No      Mobility  Bed Mobility               General bed mobility comments: NT    Transfers                   General transfer comment: NT    Ambulation/Gait Ambulation/Gait assistance: Contact guard assist Gait Distance (Feet): 10 Feet Assistive device: None Gait Pattern/deviations: Narrow base of support       General Gait Details: tandem walking to test higher level balance; ambulated 27ft at bedside with tandem steps with supervision; no true LOB noted  Stairs            Wheelchair Mobility     Tilt Bed    Modified Rankin (Stroke Patients Only)       Balance Overall balance assessment: Needs assistance     Sitting balance - Comments: NT   Standing balance support: Single extremity supported, During functional activity Standing balance-Leahy Scale: Fair Standing balance comment: bilat tandem stance for 30s each and finger tip support - more unsteadiness with RLE behind. single leg stance for 20s each and fingertip support. modified tandem stance with head turns left/right/up/down each side. all CGA                             Pertinent Vitals/Pain Pain Assessment Pain Assessment: No/denies pain    Home Living Family/patient expects to be discharged to:: Private residence Living Arrangements: Alone Available Help at Discharge: Family;Neighbor;Available PRN/intermittently Type of Home: Mobile  home Home Access: Stairs to enter Entrance Stairs-Rails: Can reach both Entrance Stairs-Number of Steps: 3   Home Layout: One level Home Equipment: Agricultural Consultant (2 wheels);Rollator (4 wheels);BSC/3in1;Shower seat      Prior Function Prior Level of Function : History of Falls (last six months);Patient poor historian/Family not available;Driving;Independent/Modified Independent;Needs assist             Mobility Comments: Pt reports indep with mobility, has a RW and 4WW but denies using them, reports 1 fall  leading to admission ADLs Comments: Pt reports indep with ADL, can prep light meals but family/neighbors tend to bring her food, endorses indep with med mgt but states I'm going to go better at it, reports she does drive but hasn't in a while     Extremity/Trunk Assessment   Upper Extremity Assessment Upper Extremity Assessment: Overall WFL for tasks assessed    Lower Extremity Assessment Lower Extremity Assessment: Overall WFL for tasks assessed    Cervical / Trunk Assessment Cervical / Trunk Assessment: Kyphotic  Communication   Communication Communication: No apparent difficulties    Cognition Arousal: Alert Behavior During Therapy: WFL for tasks assessed/performed   PT - Cognitive impairments: No apparent impairments                         Following commands: Intact       Cueing Cueing Techniques: Verbal cues     General Comments      Exercises Other Exercises Other Exercises: tandem stance bilat for 30 sec each and fingertip support Other Exercises: modified tandem stance with head turns left/right/up/down Other Exercises: single leg stance x 30 each side with fingertip balance Other Exercises: tandem walks for 83ft at bedside with CGA   Assessment/Plan    PT Assessment Patient needs continued PT services  PT Problem List Decreased strength;Decreased balance;Decreased mobility       PT Treatment Interventions Gait training;Stair training;Functional mobility training;Therapeutic activities;Therapeutic exercise;Balance training    PT Goals (Current goals can be found in the Care Plan section)  Acute Rehab PT Goals Patient Stated Goal: to return home PT Goal Formulation: With patient Time For Goal Achievement: 10/29/24 Potential to Achieve Goals: Good    Frequency Min 1X/week     Co-evaluation               AM-PAC PT 6 Clicks Mobility  Outcome Measure Help needed turning from your back to your side while in a flat bed without  using bedrails?: None Help needed moving from lying on your back to sitting on the side of a flat bed without using bedrails?: None Help needed moving to and from a bed to a chair (including a wheelchair)?: None Help needed standing up from a chair using your arms (e.g., wheelchair or bedside chair)?: None Help needed to walk in hospital room?: None Help needed climbing 3-5 steps with a railing? : A Little 6 Click Score: 23    End of Session Equipment Utilized During Treatment: Gait belt Activity Tolerance: Patient tolerated treatment well Patient left: with nursing/sitter in room;Other (comment) (pt getting ready to change herself with sitter in room for assist. did not get back into bed or chair while SPT was present) Nurse Communication: Mobility status PT Visit Diagnosis: Unsteadiness on feet (R26.81);Other abnormalities of gait and mobility (R26.89);Difficulty in walking, not elsewhere classified (R26.2)    Time: 8982-8973 PT Time Calculation (min) (ACUTE ONLY): 9 min   Charges:  Allena Bulls, SPT   Allena Bulls 10/15/2024, 10:47 AM

## 2024-10-15 NOTE — BH Assessment (Signed)
 IVC  papers  rescinded  pt now  VOL  informed  Nayeli E Carbajal  Godinez RN

## 2024-10-15 NOTE — Discharge Summary (Signed)
 Jamie Wilkins FMW:[id number] DOB: 04/20/1961 DOA: 10/12/2024  PCP: [facility]  Admit date: 10/12/2024 Discharge date: 10/15/2024  Time spent: 35 minutes  Recommendations for Outpatient Follow-up:  Close f/u pcp 1-2 weeks, check cbc, attention to BP at f/u Gi f/u 3 months    Discharge Diagnoses:  Principal Problem:   GI bleeding Active Problems:   Alcoholic hepatitis without ascites (HCC)   AF (paroxysmal atrial fibrillation) (HCC)   Alcohol use disorder in remission   Essential hypertension   CKD stage 3a, GFR 45-59 ml/min (HCC)   Acute gastric ulcer without hemorrhage or perforation   Discharge Condition: stable  Diet recommendation: heart healthy  Filed Weights   10/13/24 0500 10/14/24 0500 10/15/24 0416  Weight: 53.2 kg 53.2 kg 58.2 kg    History of present illness:  From admission h and p Jamie Wilkins is a 62 y.o. 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     Pt Has been having black stools for last week but on further questioning states last 2-3 days. Swelling started 2 days ago. No shortness of breath. Right foot with pain and more swelling than left. States her feet are usually thin. Denies any infectious symptoms and denies any hematemesis, nausea or vomiting.    On arrival to the ED patient was noted to be HDS stable.  Lab work and imaging obtained.  CBC with hemoglobin at 8.8 from baseline but appears to be normal.  BMP with stable renal function.  Mild hypocalcemia.  Troponin mildly elevated at 47, with repeat pending.  Given the drop in hemoglobin, [facility] contacted for admission.  Hospital Course:   # Macrocytic anemia # B12 deficiency # Folic acid  deficiency Hgb stable in the 8s, was 11-14s earlier this year. Does  report melena. Hx ischemic colitis in 2022, currently no pain or diarrhea so doubt that. Denies nsaids. B12 and folate both low. EGD 11/24 with a few non-bleeding gastric ulcers - start b12 and folic acid  supplementation - continue pantop bid - gi f/u few months - pcp f/u 1-2 weeks, check cbc then   # Lower extremity edema Mild. Admitter checked u/s which shows no dvt. No bnp but TTE ordered, normalLFTs norma. TSH wnl   # Alcohol use disorder # Alcohol liver disease Reports abstinence 3 months, no s/s withdrawal. 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 Sinus on EKG - monitor   # HTN Bp elevated - started amlodipine  - outpt f/u   # Mild cognitive impairment Vs dementia. Calm. Likely at baseline. B12/folate low. CT head nothing acute. Had an episode of hospital delirium on 11/24, IVCd temporarily, now returned to baseline - f/u rpr   # MDD - home mirtazapine    # Debility PT/OT consulted, advise home health, ordered along with aide and SW  Procedures: EGD   Consultations: GI  Discharge Exam: Vitals:   10/15/24 0416 10/15/24 0802  BP: (!) 189/84 (!) 180/96  Pulse: 84 85  Resp: 19 16  Temp: 98.2 F (36.8 C) 97.7 F (36.5 C)  SpO2: 98% 99%    General: NAD Cardiovascular: RRR Respiratory: CTAB  Discharge Instructions   Discharge Instructions     Diet - low sodium heart  healthy   Complete by: As directed    Increase activity slowly   Complete by: As directed       Allergies as of 10/15/2024   No Known Allergies      Medication List     STOP taking these medications    cefUROXime  250 MG tablet Commonly known as: CEFTIN    midodrine  10 MG tablet Commonly known as: PROAMATINE    midodrine  5 MG tablet Commonly known as: PROAMATINE    naproxen  500 MG tablet Commonly known as: Naprosyn    nicotine  14 mg/24hr patch Commonly known as: NICODERM CQ  - dosed in mg/24  hours   thiamine  100 MG tablet Commonly known as: VITAMIN B1   traMADol  50 MG tablet Commonly known as: ULTRAM        TAKE these medications    acetaminophen  325 MG tablet Commonly known as: TYLENOL  Take 2 tablets (650 mg total) by mouth every 6 (six) hours as needed for mild pain, fever or headache.   albuterol  108 (90 Base) MCG/ACT inhaler Commonly known as: VENTOLIN  HFA Inhale 1-2 puffs into the lungs every 6 (six) hours as needed for wheezing or shortness of breath.   albuterol  108 (90 Base) MCG/ACT inhaler Commonly known as: Ventolin  HFA Inhale 1-2 puffs into the lungs once every 6 hours as needed for wheezing or shortness of breath.   amLODipine  10 MG tablet Commonly known as: NORVASC  Take 1 tablet (10 mg total) by mouth daily. Start taking on: October 16, 2024   cyanocobalamin  1000 MCG tablet Commonly known as: VITAMIN B12 Take 1 tablet (1,000 mcg total) by mouth daily.   ferrous sulfate  325 (65 FE) MG tablet Commonly known as: FerrouSul Take 1 tablet (325 mg total) by mouth every other day.   Flovent  HFA 110 MCG/ACT inhaler Generic drug: fluticasone  Inhale 1 puff into the lungs daily as needed (asthma).   folic acid  1 MG tablet Commonly known as: FOLVITE  Take 1 tablet (1 mg total) by mouth daily. Start taking on: October 16, 2024   gabapentin 300 MG capsule Commonly known as: NEURONTIN Take 300 mg by mouth 3 (three) times daily.   hydrocortisone  cream 1 % Apply topically as needed for itching.   Hydrocortisone Jeffery Max Str 1 % Generic drug: hydrocortisone  cream Apply one application topically to the affected area(s) daily as needed for itching.   lidocaine  4 % cream Commonly known as: LMX Apply topically 3 (three) times daily as needed (foot pain).   mirtazapine  30 MG tablet Commonly known as: REMERON  Take 1 tablet (30 mg total) by mouth at bedtime. What changed: Another medication with the same name was removed. Continue taking this  medication, and follow the directions you see here.   pantoprazole  40 MG tablet Commonly known as: PROTONIX  Take 1 tablet (40 mg total) by mouth 2 (two) times daily.   Thera-M Tabs Take 1 tablet by mouth daily.   thiamine  100 MG tablet Commonly known as: Vitamin B-1 Take 1 tablet (100 mg total) by mouth daily. Start taking on: October 16, 2024   Zinc  Oxide 12.8 % ointment Commonly known as: TRIPLE PASTE Apply topically as needed for irritation.       No Known Allergies  Follow-up Information     Jinny Carmine, MD Follow up.   Specialty: Gastroenterology Why: in about 3 months Contact information: [address] [place]  [place] [address] [phone]         [facility] Follow up.   Specialty: General Practice Contact information: [phone]  [employer] [place] [address] [phone]                  The results of significant diagnostics from this hospitalization (including imaging, microbiology, ancillary and laboratory) are listed below for reference.    Significant Diagnostic Studies: CT HEAD WO CONTRAST ( ) Result Date: 10/14/2024 EXAM: CT HEAD WITHOUT CONTRAST 10/14/2024 06:14:28 PM TECHNIQUE: CT of the head was performed without the administration of intravenous contrast. Automated exposure control, iterative reconstruction, and/or weight based adjustment of the mA/kV was utilized to reduce the radiation dose to as low as reasonably achievable. COMPARISON: CT head 02/13/2022. CLINICAL HISTORY: Cognitive impairment FINDINGS: BRAIN AND VENTRICLES: No acute hemorrhage. No evidence of acute infarct. No hydrocephalus. No extra-axial collection. No mass effect or midline shift. ORBITS: No acute abnormality. SINUSES: Left maxillary sinus mucosal thickening with air-fluid level. SOFT TISSUES AND SKULL: No skull fracture. IMPRESSION: 1. No acute intracranial abnormality. 2. Left maxillary sinus mucosal thickening with air-fluid level.  Electronically signed by: Gilmore Molt MD 10/14/2024 10:06 PM EST RP Workstation: [id number]   ECHOCARDIOGRAM COMPLETE Result Date: 10/13/2024    ECHOCARDIOGRAM REPORT   Patient Name:   Jamie Wilkins Date of Exam: 10/13/2024 Medical Rec #:  [id number]          Height:       67.0 in Accession #:    [id number]         Weight:       117.3 lb Date of Birth:  1960/12/27           BSA:          1.612 m Patient Age:    63 years           BP:           139/97 mmHg Patient Gender: F                  HR:           91 bpm. Exam Location:  [facility] Procedure: 2D Echo, Cardiac Doppler, Color Doppler and Strain Analysis (Both            Spectral and Color Flow Doppler were utilized during procedure). Indications:     Elevated Troponin  History:         Patient has prior history of Echocardiogram examinations, most                  recent 02/16/2021.  Sonographer:     Thedora Louder RDCS, FASE Referring Phys:  [id number] [facility] Diagnosing Phys: Denyse Bathe  Sonographer Comments: Global longitudinal strain was attempted. IMPRESSIONS  1. Left ventricular ejection fraction, by estimation, is 60 to 65%. The left ventricle has normal function. The left ventricle has no regional wall motion abnormalities. There is mild concentric left ventricular hypertrophy. Left ventricular diastolic parameters are consistent with Grade I diastolic dysfunction (impaired relaxation).  2. Right ventricular systolic function is normal. The right ventricular size is normal.  3. The mitral valve is normal in structure. No evidence of mitral valve regurgitation. No evidence of mitral stenosis.  4. The aortic valve is normal in structure. Aortic valve regurgitation is not visualized. No aortic stenosis is present.  5. The inferior vena cava is normal in size with greater than 50% respiratory variability, suggesting right atrial pressure of 3 mmHg. FINDINGS  Left Ventricle: Left ventricular ejection fraction, by estimation, is 60 to 65%. The left  ventricle has normal function. The left ventricle  has no regional wall motion abnormalities. Strain was performed and the global longitudinal strain is indeterminate. The left ventricular internal cavity size was normal in size. There is mild concentric left ventricular hypertrophy. Left ventricular diastolic parameters are consistent with Grade I diastolic dysfunction (impaired relaxation). Right Ventricle: The right ventricular size is normal. No increase in right ventricular wall thickness. Right ventricular systolic function is normal. Left Atrium: Left atrial size was normal in size. Right Atrium: Right atrial size was normal in size. Pericardium: There is no evidence of pericardial effusion. Mitral Valve: The mitral valve is normal in structure. No evidence of mitral valve regurgitation. No evidence of mitral valve stenosis. Tricuspid Valve: The tricuspid valve is normal in structure. Tricuspid valve regurgitation is mild . No evidence of tricuspid stenosis. Aortic Valve: The aortic valve is normal in structure. Aortic valve regurgitation is not visualized. No aortic stenosis is present. Aortic valve peak gradient measures 3.5 mmHg. Pulmonic Valve: The pulmonic valve was normal in structure. Pulmonic valve regurgitation is not visualized. No evidence of pulmonic stenosis. Aorta: The aortic root is normal in size and structure. Venous: The inferior vena cava is normal in size with greater than 50% respiratory variability, suggesting right atrial pressure of 3 mmHg. IAS/Shunts: No atrial level shunt detected by color flow Doppler. Additional Comments: 3D was performed not requiring image post processing on an independent workstation and was indeterminate.  LEFT VENTRICLE PLAX 2D LVIDd:         3.70 cm   Diastology LVIDs:         2.60 cm   LV e' medial:    7.51 cm/s LV PW:         1.20 cm   LV E/e' medial:  9.2 LV IVS:        1.30 cm   LV e' lateral:   11.00 cm/s LVOT diam:     2.00 cm   LV E/e' lateral: 6.3 LV  SV:         48 LV SV Index:   30 LVOT Area:     3.14 cm  RIGHT VENTRICLE RV Basal diam:  2.60 cm RV S prime:     15.30 cm/s TAPSE (M-mode): 1.6 cm LEFT ATRIUM           Index       RIGHT ATRIUM           Index LA diam:      2.80 cm 1.74 cm/m  RA Area:     15.60 cm LA Vol (A2C): 11.6 ml 7.20 ml/m  RA Volume:   43.30 ml  26.86 ml/m LA Vol (A4C): 6.7 ml  4.16 ml/m  AORTIC VALVE                 PULMONIC VALVE AV Area (Vmax): 3.07 cm     PV Vmax:        0.83 m/s AV Vmax:        93.00 cm/s   PV Peak grad:   2.8 mmHg AV Peak Grad:   3.5 mmHg     RVOT Peak grad: 2 mmHg LVOT Vmax:      91.00 cm/s LVOT Vmean:     57.500 cm/s LVOT VTI:       0.153 m  AORTA Ao Root diam: 3.00 cm MITRAL VALVE               TRICUSPID VALVE MV Area (PHT): 3.99 cm    TR Peak grad:  35.3 mmHg MV Decel Time: 190 msec    TR Vmax:        297.00 cm/s MV E velocity: 69.20 cm/s MV A velocity: 73.40 cm/s  SHUNTS MV E/A ratio:  0.94        Systemic VTI:  0.15 m                            Systemic Diam: 2.00 cm Denyse Bathe Electronically signed by Denyse Bathe Signature Date/Time: 10/13/2024/1:26:37 PM    Final    US  Venous Img Lower Bilateral (DVT) Result Date: 10/12/2024 CLINICAL DATA:  Bilateral lower extremity swelling. EXAM: Bilateral LOWER EXTREMITY VENOUS DOPPLER ULTRASOUND TECHNIQUE: Gray-scale sonography with compression, as well as color and duplex ultrasound, were performed to evaluate the deep venous system(s) from the level of the common femoral vein through the popliteal and proximal calf veins. COMPARISON:  None Available. FINDINGS: VENOUS Normal compressibility of the common femoral, superficial femoral, and popliteal veins, as well as the visualized calf veins. Visualized portions of profunda femoral vein and great saphenous vein unremarkable. No filling defects to suggest DVT on grayscale or color Doppler imaging. Doppler waveforms show normal direction of venous flow, normal respiratory plasticity and response to  augmentation. Limited views of the contralateral common femoral vein are unremarkable. OTHER Are mild bilateral calf subcutaneous edema. Limitations: none IMPRESSION: 1. No evidence of DVT. 2. Mild bilateral calf subcutaneous edema. Electronically Signed   By: Vanetta Chou M.D.   On: 10/12/2024 17:57   DG Chest 2 View Result Date: 10/12/2024 CLINICAL DATA:  leg swelling EXAM: CHEST - 2 VIEW COMPARISON:  July 03, 2024 June 09, 2021 FINDINGS: The cardiomediastinal silhouette is unchanged in contour.Flattening of the hemidiaphragms. No significant pleural effusion. No pneumothorax. No acute pleuroparenchymal abnormality. Visualized abdomen is unremarkable. Multilevel degenerative changes of the thoracic spine. IMPRESSION: No acute cardiopulmonary abnormality. Electronically Signed   By: Corean Salter M.D.   On: 10/12/2024 13:54    Microbiology: No results found for this or any previous visit (from the past 240 hours).   Labs: Basic Metabolic Panel: Recent Labs  Lab 10/12/24 1323 10/13/24 0545 10/13/24 1007 10/14/24 0426 10/15/24 0438  NA 137  136 140 140 142 142  K 4.2  4.2 4.1 4.1 4.7 4.7  CL 104  102 106 104 108 106  CO2 22  23 26 26 26 27   GLUCOSE 81  83 60* 120* 76 76  BUN 20  18 18 18 21 15   CREATININE 1.19*  1.20* 1.07* 1.16* 1.26* 1.08*  CALCIUM  8.5*  8.5* 8.4* 8.5* 8.2* 8.8*  MG 1.6*  --   --   --   --    Liver Function Tests: Recent Labs  Lab 10/12/24 1323 10/13/24 1007  AST 33 27  ALT 18 16  ALKPHOS 176* 164*  BILITOT 0.4 0.4  PROT 6.3* 5.6*  ALBUMIN  3.2* 3.2*   No results for input(s): LIPASE, AMYLASE in the last 168 hours. No results for input(s): AMMONIA in the last 168 hours. CBC: Recent Labs  Lab 10/12/24 1323 10/13/24 0017 10/13/24 0545 10/14/24 0426 10/15/24 0438  WBC 5.0  --  3.7* 4.1 3.9*  HGB 8.8* 8.4* 8.3* 8.0* 8.4*  HCT 27.2* 26.7* 25.1* 24.2* 27.2*  MCV 103.8*  --  102.0* 103.4* 108.8*  PLT 246  --  252 263 259    Cardiac Enzymes: No results for input(s): CKTOTAL, CKMB, CKMBINDEX, TROPONINI in the last  168 hours. BNP: BNP (last 3 results) No results for input(s): BNP in the last 8760 hours.  ProBNP (last 3 results) No results for input(s): PROBNP in the last 8760 hours.  CBG: Recent Labs  Lab 10/13/24 0944 10/14/24 0759 10/14/24 1326  GLUCAP 71 73 78       Signed:  Devaughn KATHEE Ban MD.  [facility] 10/15/2024, 10:58 AM

## 2024-10-15 NOTE — Evaluation (Addendum)
 Occupational Therapy Evaluation Patient Details Name: Jamie Wilkins MRN: [id number] DOB: 1961/03/28 Today's Date: 10/15/2024   History of Present Illness   63 y.o. 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     Clinical Impressions Pt was seen for OT evaluation this date. Prior to hospital admission, pt was living alone in her mobile home with 3 STE and bilat rails. She endorses having a RW and [id number] but denies using them. She endorses having a shower chair that she sits on to bathe, indep with med mgt, and light meal prep. She reports being able to drive but hasn't in a while. She reports having family and neighbors who help bring her food sometimes. Oriented to self and city, reports being here for a fall and unable to walk, indicates it's June 2022. Pt presents with deficits in cognition, safety awareness, higher level balance, and activity tolerance, affecting safe and optimal ADL completion. Pt required SBA-supv for ADL transfers, LB ADL, and mobility without AD ~170' around nurses station. Pt reports she plans to speak with family about having someone come live with her. Does not give a timeline.  Pt would benefit from skilled OT services upon discharge to address noted impairments and functional limitations particularly with IADL such as driving, medication management, and other independent living skills (see below for any additional details) in order to maximize safety and independence while minimizing future risk of falls, injury, and readmission.    If plan is discharge home, recommend the following:   A little help with walking and/or transfers;A little help with bathing/dressing/bathroom;Assistance with cooking/housework;Help with stairs or ramp for entrance;Assist for  transportation;Direct supervision/assist for medications management;Direct supervision/assist for financial management     Functional Status Assessment   Patient has had a recent decline in their functional status and demonstrates the ability to make significant improvements in function in a reasonable and predictable amount of time.     Equipment Recommendations   None recommended by OT     Recommendations for Other Services         Precautions/Restrictions   Precautions Precautions: Fall Recall of Precautions/Restrictions: Impaired Restrictions Weight Bearing Restrictions Per Provider Order: No     Mobility Bed Mobility Overal bed mobility: Modified Independent                  Transfers Overall transfer level: Modified independent Equipment used: None                      Balance Overall balance assessment: Needs assistance Sitting-balance support: No upper extremity supported, Feet supported Sitting balance-Leahy Scale: Normal     Standing balance support: No upper extremity supported Standing balance-Leahy Scale: Fair                             ADL either performed or assessed with clinical judgement   ADL                                         General ADL Comments: Pt donned slip on shoes seated EOB wiht setup, supv/SBA for LB dressing involving STS transfers, supv-SBA for ADL mobility without  AD     Vision         Perception         Praxis         Pertinent Vitals/Pain Pain Assessment Pain Assessment: No/denies pain (no pain at rest, up to 8/10 L ankle pain with longer distance mobility)     Extremity/Trunk Assessment Upper Extremity Assessment Upper Extremity Assessment: Overall WFL for tasks assessed   Lower Extremity Assessment Lower Extremity Assessment: Defer to PT evaluation       Communication Communication Communication: No apparent difficulties   Cognition Arousal:  Alert Behavior During Therapy: [facility] for tasks assessed/performed Cognition: No family/caregiver present to determine baseline             OT - Cognition Comments: Pt oriented to self, [place], here after a fall, reports it's June 2022; questionable safety awareness                 Following commands: Intact       Cueing  General Comments   Cueing Techniques: Verbal cues      Exercises Other Exercises Other Exercises: Pt edu safety, encouraged assist for med mgt, driving, and meals upon initial return home. Other Exercises: Pt ambulated ~170' with SBA-CGA and no AD. Did slow when speaking but no overt LOB   Shoulder Instructions      Home Living Family/patient expects to be discharged to:: Private residence Living Arrangements: Alone Available Help at Discharge: Family;Neighbor;Available PRN/intermittently Type of Home: Mobile home Home Access: Stairs to enter Entrance Stairs-Number of Steps: 3 Entrance Stairs-Rails: Can reach both Home Layout: One level     Bathroom Shower/Tub: Tub/shower unit;Walk-in shower   Bathroom Toilet:  (BSC over toilet)     Home Equipment: [employer]ant (2 wheels);Rollator (4 wheels);BSC/3in1;Shower seat          Prior Functioning/Environment Prior Level of Function : History of Falls (last six months);Patient poor historian/Family not available;Driving;Independent/Modified Independent;Needs assist             Mobility Comments: Pt reports indep with mobility, has a RW and 4WW but denies using them, reports 1 fall leading to admission ADLs Comments: Pt reports indep with ADL, can prep light meals but family/neighbors tend to bring her food, endorses indep with med mgt but states I'm going to go better at it, reports she does drive but hasn't in a while    OT Problem List: Pain;Decreased cognition;Decreased safety awareness;Impaired balance (sitting and/or standing);Decreased knowledge of use of DME or AE   OT  Treatment/Interventions: Self-care/ADL training;Therapeutic activities;Cognitive remediation/compensation;DME and/or AE instruction;Patient/family education;Balance training;Energy conservation      OT Goals(Current goals can be found in the care plan section)   Acute Rehab OT Goals Patient Stated Goal: go home OT Goal Formulation: All assessment and education complete, DC therapy ADL Goals Additional ADL Goal #1: Pt will demo mod indep with medication management, 2/2 opportunities. Additional ADL Goal #2: Pt will verbalize plan to implement at least 2 learned ECS/falls prevention strategies to maximize safety.   OT Frequency:  Min 1X/week    Co-evaluation              AM-PAC OT 6 Clicks Daily Activity     Outcome Measure Help from another person eating meals?: None Help from another person taking care of personal grooming?: None Help from another person toileting, which includes using toliet, bedpan, or urinal?: None Help from another person bathing (including washing, rinsing, drying)?: A Little Help from another person  to put on and taking off regular upper body clothing?: None Help from another person to put on and taking off regular lower body clothing?: A Little 6 Click Score: 22   End of Session Nurse Communication: Mobility status  Activity Tolerance: Patient tolerated treatment well Patient left: in bed;with call bell/phone within reach;with nursing/sitter in room  OT Visit Diagnosis: Unsteadiness on feet (R26.81);History of falling (Z91.81)                Time: 9154-9140 OT Time Calculation (min): 14 min Charges:  OT General Charges $OT Visit: 1 Visit OT Evaluation $OT Eval Low Complexity: 1 Low OT Treatments $Therapeutic Activity: 8-22 mins  Warren SAUNDERS., MPH, MS, OTR/L ascom [phone] 10/15/24, 9:18 AM

## 2024-10-15 NOTE — Plan of Care (Signed)
  Problem: Clinical Measurements: Goal: Ability to maintain clinical measurements within normal limits will improve Outcome: Progressing   Problem: Nutrition: Goal: Adequate nutrition will be maintained Outcome: Progressing   Problem: Elimination: Goal: Will not experience complications related to urinary retention Outcome: Progressing   Problem: Safety: Goal: Ability to remain free from injury will improve Outcome: Progressing   Problem: Skin Integrity: Goal: Risk for impaired skin integrity will decrease Outcome: Progressing
